# Patient Record
Sex: Female | Born: 1965
Health system: Southern US, Community
[De-identification: ages and names within clinical notes are randomized; demographics above are authoritative.]

## PROBLEM LIST (undated history)

## (undated) DIAGNOSIS — Z789 Other specified health status: Secondary | ICD-10-CM

## (undated) DIAGNOSIS — D219 Benign neoplasm of connective and other soft tissue, unspecified: Secondary | ICD-10-CM

## (undated) DIAGNOSIS — F329 Major depressive disorder, single episode, unspecified: Secondary | ICD-10-CM

## (undated) DIAGNOSIS — R51 Headache: Secondary | ICD-10-CM

## (undated) DIAGNOSIS — I8390 Asymptomatic varicose veins of unspecified lower extremity: Secondary | ICD-10-CM

## (undated) DIAGNOSIS — T7840XA Allergy, unspecified, initial encounter: Secondary | ICD-10-CM

## (undated) DIAGNOSIS — F32A Depression, unspecified: Secondary | ICD-10-CM

## (undated) HISTORY — DX: Major depressive disorder, single episode, unspecified: F32.9

## (undated) HISTORY — PX: SEPTOPLASTY: SUR1290

## (undated) HISTORY — PX: WISDOM TOOTH EXTRACTION: SHX21

## (undated) HISTORY — PX: VARICOSE VEIN SURGERY: SHX832

## (undated) HISTORY — DX: Asymptomatic varicose veins of unspecified lower extremity: I83.90

## (undated) HISTORY — DX: Benign neoplasm of connective and other soft tissue, unspecified: D21.9

## (undated) HISTORY — DX: Depression, unspecified: F32.A

## (undated) HISTORY — DX: Allergy, unspecified, initial encounter: T78.40XA

---

## 2004-08-13 ENCOUNTER — Inpatient Hospital Stay (HOSPITAL_COMMUNITY): Admission: AD | Admit: 2004-08-13 | Discharge: 2004-08-13 | Payer: Self-pay | Admitting: Obstetrics and Gynecology

## 2004-08-15 ENCOUNTER — Inpatient Hospital Stay (HOSPITAL_COMMUNITY): Admission: AD | Admit: 2004-08-15 | Discharge: 2004-08-15 | Payer: Self-pay | Admitting: Obstetrics and Gynecology

## 2004-08-22 ENCOUNTER — Inpatient Hospital Stay (HOSPITAL_COMMUNITY): Admission: AD | Admit: 2004-08-22 | Discharge: 2004-08-22 | Payer: Self-pay | Admitting: *Deleted

## 2005-01-22 ENCOUNTER — Inpatient Hospital Stay (HOSPITAL_COMMUNITY): Admission: AD | Admit: 2005-01-22 | Discharge: 2005-01-22 | Payer: Self-pay | Admitting: Obstetrics & Gynecology

## 2009-02-14 ENCOUNTER — Ambulatory Visit: Payer: Self-pay | Admitting: Internal Medicine

## 2009-02-14 DIAGNOSIS — D259 Leiomyoma of uterus, unspecified: Secondary | ICD-10-CM | POA: Insufficient documentation

## 2009-02-14 DIAGNOSIS — R5383 Other fatigue: Secondary | ICD-10-CM

## 2009-02-14 DIAGNOSIS — M545 Low back pain, unspecified: Secondary | ICD-10-CM | POA: Insufficient documentation

## 2009-02-14 DIAGNOSIS — E669 Obesity, unspecified: Secondary | ICD-10-CM | POA: Insufficient documentation

## 2009-02-14 DIAGNOSIS — R5381 Other malaise: Secondary | ICD-10-CM | POA: Insufficient documentation

## 2009-02-14 DIAGNOSIS — B351 Tinea unguium: Secondary | ICD-10-CM | POA: Insufficient documentation

## 2009-02-14 DIAGNOSIS — F909 Attention-deficit hyperactivity disorder, unspecified type: Secondary | ICD-10-CM | POA: Insufficient documentation

## 2009-02-14 DIAGNOSIS — L821 Other seborrheic keratosis: Secondary | ICD-10-CM | POA: Insufficient documentation

## 2009-02-22 DIAGNOSIS — R7309 Other abnormal glucose: Secondary | ICD-10-CM | POA: Insufficient documentation

## 2009-02-22 LAB — CONVERTED CEMR LAB
ALT: 35 units/L (ref 0–35)
AST: 20 units/L (ref 0–37)
Albumin: 4.2 g/dL (ref 3.5–5.2)
Alkaline Phosphatase: 80 units/L (ref 39–117)
BUN: 21 mg/dL (ref 6–23)
Basophils Absolute: 0 10*3/uL (ref 0.0–0.1)
Basophils Relative: 0 % (ref 0–1)
CO2: 24 meq/L (ref 19–32)
Calcium: 9 mg/dL (ref 8.4–10.5)
Chloride: 106 meq/L (ref 96–112)
Cholesterol: 154 mg/dL (ref 0–200)
Creatinine, Ser: 0.63 mg/dL (ref 0.40–1.20)
Eosinophils Absolute: 0.1 10*3/uL (ref 0.0–0.7)
Eosinophils Relative: 1 % (ref 0–5)
Glucose, Bld: 107 mg/dL — ABNORMAL HIGH (ref 70–99)
HCT: 43.7 % (ref 36.0–46.0)
HDL: 62 mg/dL (ref >39)
Hemoglobin: 13.6 g/dL (ref 12.0–15.0)
LDL Cholesterol: 78 mg/dL (ref 0–99)
Lymphocytes Relative: 24 % (ref 12–46)
Lymphs Abs: 2.3 10*3/uL (ref 0.7–4.0)
MCHC: 31.1 g/dL (ref 30.0–36.0)
MCV: 99.8 fL (ref 78.0–100.0)
Monocytes Absolute: 0.6 10*3/uL (ref 0.1–1.0)
Monocytes Relative: 6 % (ref 3–12)
Neutro Abs: 6.5 10*3/uL (ref 1.7–7.7)
Neutrophils Relative %: 69 % (ref 43–77)
Platelets: 255 10*3/uL (ref 150–400)
Potassium: 4.8 meq/L (ref 3.5–5.3)
RBC: 4.38 M/uL (ref 3.87–5.11)
RDW: 14.2 % (ref 11.5–15.5)
Sodium: 141 meq/L (ref 135–145)
TSH: 1.452 microintl units/mL (ref 0.350–4.500)
Total Bilirubin: 0.4 mg/dL (ref 0.3–1.2)
Total CHOL/HDL Ratio: 2.5
Total Protein: 7 g/dL (ref 6.0–8.3)
Triglycerides: 69 mg/dL (ref <150)
VLDL: 14 mg/dL (ref 0–40)
WBC: 9.5 10*3/uL (ref 4.0–10.5)

## 2009-03-06 ENCOUNTER — Encounter: Admission: RE | Admit: 2009-03-06 | Discharge: 2009-03-06 | Payer: Self-pay | Admitting: Internal Medicine

## 2009-03-06 ENCOUNTER — Encounter (INDEPENDENT_AMBULATORY_CARE_PROVIDER_SITE_OTHER): Payer: Self-pay | Admitting: Internal Medicine

## 2009-03-11 ENCOUNTER — Encounter (INDEPENDENT_AMBULATORY_CARE_PROVIDER_SITE_OTHER): Payer: Self-pay | Admitting: Internal Medicine

## 2009-03-14 ENCOUNTER — Ambulatory Visit (HOSPITAL_COMMUNITY): Admission: RE | Admit: 2009-03-14 | Discharge: 2009-03-14 | Payer: Self-pay | Admitting: Internal Medicine

## 2009-03-15 ENCOUNTER — Encounter: Admission: RE | Admit: 2009-03-15 | Discharge: 2009-05-28 | Payer: Self-pay | Admitting: Internal Medicine

## 2009-03-20 ENCOUNTER — Encounter (INDEPENDENT_AMBULATORY_CARE_PROVIDER_SITE_OTHER): Payer: Self-pay | Admitting: Internal Medicine

## 2009-03-28 ENCOUNTER — Ambulatory Visit: Payer: Self-pay | Admitting: Internal Medicine

## 2009-03-28 LAB — CONVERTED CEMR LAB
Blood Glucose, AC Bkfst: 118 mg/dL
Hgb A1c MFr Bld: 5.3 %

## 2009-04-18 ENCOUNTER — Ambulatory Visit: Payer: Self-pay | Admitting: Internal Medicine

## 2009-04-18 DIAGNOSIS — R109 Unspecified abdominal pain: Secondary | ICD-10-CM | POA: Insufficient documentation

## 2009-04-22 ENCOUNTER — Ambulatory Visit (HOSPITAL_COMMUNITY): Admission: RE | Admit: 2009-04-22 | Discharge: 2009-04-22 | Payer: Self-pay | Admitting: Internal Medicine

## 2009-04-25 ENCOUNTER — Ambulatory Visit: Payer: Self-pay | Admitting: *Deleted

## 2009-05-12 ENCOUNTER — Encounter (INDEPENDENT_AMBULATORY_CARE_PROVIDER_SITE_OTHER): Payer: Self-pay | Admitting: Internal Medicine

## 2009-06-05 ENCOUNTER — Encounter (INDEPENDENT_AMBULATORY_CARE_PROVIDER_SITE_OTHER): Payer: Self-pay | Admitting: Internal Medicine

## 2009-06-14 ENCOUNTER — Ambulatory Visit: Payer: Self-pay | Admitting: Internal Medicine

## 2009-06-14 ENCOUNTER — Encounter (INDEPENDENT_AMBULATORY_CARE_PROVIDER_SITE_OTHER): Payer: Self-pay | Admitting: Internal Medicine

## 2009-06-14 DIAGNOSIS — Q828 Other specified congenital malformations of skin: Secondary | ICD-10-CM | POA: Insufficient documentation

## 2009-06-14 LAB — CONVERTED CEMR LAB
BUN: 13 mg/dL (ref 6–23)
Bilirubin Urine: NEGATIVE
Blood in Urine, dipstick: NEGATIVE
CO2: 20 meq/L (ref 19–32)
Calcium: 9 mg/dL (ref 8.4–10.5)
Chlamydia, DNA Probe: NEGATIVE
Chloride: 104 meq/L (ref 96–112)
Creatinine, Ser: 0.67 mg/dL (ref 0.40–1.20)
GC Probe Amp, Genital: NEGATIVE
Glucose, Bld: 87 mg/dL (ref 70–99)
Glucose, Urine, Semiquant: NEGATIVE
KOH Prep: NEGATIVE
Ketones, urine, test strip: NEGATIVE
Nitrite: NEGATIVE
Potassium: 4.2 meq/L (ref 3.5–5.3)
Protein, U semiquant: NEGATIVE
Rapid HIV Screen: NEGATIVE
Sodium: 139 meq/L (ref 135–145)
Specific Gravity, Urine: 1.01
Urobilinogen, UA: 0.2
WBC Urine, dipstick: NEGATIVE
Whiff Test: NEGATIVE
pH: 6.5

## 2009-06-18 ENCOUNTER — Ambulatory Visit (HOSPITAL_COMMUNITY): Admission: RE | Admit: 2009-06-18 | Discharge: 2009-06-18 | Payer: Self-pay | Admitting: Internal Medicine

## 2009-06-18 ENCOUNTER — Encounter (INDEPENDENT_AMBULATORY_CARE_PROVIDER_SITE_OTHER): Payer: Self-pay | Admitting: Internal Medicine

## 2009-06-24 ENCOUNTER — Encounter (INDEPENDENT_AMBULATORY_CARE_PROVIDER_SITE_OTHER): Payer: Self-pay | Admitting: Internal Medicine

## 2009-07-29 ENCOUNTER — Telehealth (INDEPENDENT_AMBULATORY_CARE_PROVIDER_SITE_OTHER): Payer: Self-pay | Admitting: Internal Medicine

## 2009-08-06 ENCOUNTER — Ambulatory Visit: Payer: Self-pay | Admitting: Internal Medicine

## 2009-08-06 DIAGNOSIS — R51 Headache: Secondary | ICD-10-CM | POA: Insufficient documentation

## 2009-08-06 DIAGNOSIS — R519 Headache, unspecified: Secondary | ICD-10-CM | POA: Insufficient documentation

## 2010-03-04 ENCOUNTER — Telehealth (INDEPENDENT_AMBULATORY_CARE_PROVIDER_SITE_OTHER): Payer: Self-pay | Admitting: Internal Medicine

## 2010-06-04 ENCOUNTER — Telehealth (INDEPENDENT_AMBULATORY_CARE_PROVIDER_SITE_OTHER): Payer: Self-pay | Admitting: Internal Medicine

## 2010-06-18 ENCOUNTER — Telehealth (INDEPENDENT_AMBULATORY_CARE_PROVIDER_SITE_OTHER): Payer: Self-pay | Admitting: Internal Medicine

## 2010-07-18 ENCOUNTER — Telehealth (INDEPENDENT_AMBULATORY_CARE_PROVIDER_SITE_OTHER): Payer: Self-pay | Admitting: Internal Medicine

## 2010-08-28 ENCOUNTER — Telehealth (INDEPENDENT_AMBULATORY_CARE_PROVIDER_SITE_OTHER): Payer: Self-pay | Admitting: Internal Medicine

## 2010-08-29 ENCOUNTER — Ambulatory Visit: Payer: Self-pay | Admitting: Internal Medicine

## 2010-10-02 ENCOUNTER — Ambulatory Visit: Admit: 2010-10-02 | Payer: Self-pay | Admitting: Internal Medicine

## 2010-10-12 ENCOUNTER — Encounter: Payer: Self-pay | Admitting: Internal Medicine

## 2010-10-21 NOTE — Progress Notes (Signed)
Summary: NEED TO TALK TO THE NURSE  Phone Note Call from Patient Call back at [phone]   Summary of Call: START SATURDAY IS GOING TO BE TWO WEEKS SYMPTOMS IS FEVER, CONGESTION ,SINUS,SORE THROAT. PLEASE CALL PATIENT WE DON'T HAVE APPOINTMENT AVALIABLE WITH [facility] TILL 12.02.11 Initial call taken by: Domenic Polite,  July 18, 2010 11:07 AM  Follow-up for Phone Call        Starting to feel better, went to [facility] and got Amoxicillin and instructions for management of symptoms.  BP 150/88- advised that any stressors to the body can elevate BP.  Will call back if symptoms persist or worsen. Follow-up by: Dutch Quint RN,  July 18, 2010 5:35 PM

## 2010-10-21 NOTE — Miscellaneous (Signed)
Summary: Rehab Report/DISCHARGE SUMMARY  Rehab Report/DISCHARGE SUMMARY   Imported By: Arta Bruce 10/02/2009 15:36:38  _____________________________________________________________________  External Attachment:    Type:   Image     Comment:   External Document

## 2010-10-21 NOTE — Progress Notes (Signed)
Summary: Query:  Refill orthomicronor?  Phone Note Refill Request   Refills Requested: Medication #1:  ORTHO MICRONOR 0.35 MG TABS 1 tab by mouth daily. PLEASE CALL IT IN TO [facility] ON [address]   Initial call taken by: Oscar La,  June 04, 2010 3:22 PM  Follow-up for Phone Call        Do you want to refill her orthomicronor?  She was last seen 07/2009.  Has appt. for CPP 06/19/10.  Dutch Quint RN  June 04, 2010 3:39 PM   Additional Follow-up for Phone Call Additional follow up Details #1::        Fill x 2 mos.  Renew for 1 year at CPP. Tereso Newcomer PA-C  June 04, 2010 5:32 PM  Noted.  Rx sent to [facility] per pt. request.  Left message on answering machine for pt. to return call.  Dutch Quint RN  June 05, 2010 11:55 AM  Pt. notified of Rx refill -- confirmed CPP appt. for 06/19/10.  Dutch Quint RN  June 05, 2010 12:57 PM     Prescriptions: ORTHO MICRONOR 0.35 MG TABS (NORETHINDRONE (CONTRACEPTIVE)) 1 tab by mouth daily  #1 pack x 2   Entered by:   Dutch Quint RN   Authorized by:   Tereso Newcomer PA-C   Signed by:   Dutch Quint RN on 06/05/2010   Method used:   Electronically to        [facility]. [phone]* (retail)       [address]       [place], [place]  [address]       Ph: [phone]       Fax: [phone]   RxID:   [id number]

## 2010-10-21 NOTE — Progress Notes (Signed)
Summary: TALK TO DR   Phone Note Call from Patient   Caller: Patient Reason for Call: Talk to Doctor Summary of Call: PT IS TAKING PROGESTERONE FOR FYBROID AND PT IS BEING HAVING HER PERIODS EVERY OTHER WEEK AND SHE WANTS TO LET KNOW DR Jacqulyne Gladue IF IS NORMAL . PLEASE, CALL HER BACK [phone] THANKS . Initial call taken by: Cheryll Dessert,  March 04, 2010 11:40 AM  Follow-up for Phone Call        Sent to Dr. Delrae Alfred.  Dutch Quint RN  March 04, 2010 5:06 PM   Additional Follow-up for Phone Call Additional follow up Details #1::        Need more info--this is why she was placed on Micronor.  Did the bleeding get better at one point?-- as we have not heard from her in a while. If it improved and started back up again, how long has it been going on. How severe is the bleeding? Additional Follow-up by: Julieanne Manson MD,  March 04, 2010 6:20 PM    Additional Follow-up for Phone Call Additional follow up Details #2::    Left message on answering machine to return call.  Dutch Quint RN  March 05, 2010 9:27 AM  Spoke with pt. - she states that her bleeding has greatly improved, but, between her last on-time periods, she had an episode of a break-through period lasting five days, using 1-2 pads per day, which came seven days after her regular period had ended, which had been seven days, using 2 pads.   Her current period she is using one pad per day.  She was concerned by the break-through bleeding, wondering if it was normal.  I advised her that it is common for women on oral contraceptives to experience occasional break-through bleeding, but that I would inform her provider.  Dutch Quint RN  March 06, 2010 10:21 AM  Follow-up by: Dutch Quint RN,  March 06, 2010 10:21 AM  Additional Follow-up for Phone Call Additional follow up Details #3:: Details for Additional Follow-up Action Taken: Agree--have her call if continues to have problems  Advised and pt. agrees. Dutch Quint RN  March 07, 2010 12:35 PM  Additional Follow-up by: Julieanne Manson MD,  March 06, 2010 6:16 PM

## 2010-10-21 NOTE — Progress Notes (Signed)
Summary: REFILLS  Phone Note Refill Request   Refills Requested: Medication #1:  ORTHO MICRONOR 0.35 MG TABS 1 tab by mouth daily. PLEASE SEND RX TO [facility] FOR 2 MONTHS NEX APP IS 08-29-10.  Initial call taken by: Oscar La,  June 18, 2010 3:58 PM  Follow-up for Phone Call        That was already done on the 15th of this month--please call pharmacy and see if they received and notify pt. If pharmacy did not receive--give to them verbally please Follow-up by: Julieanne Manson MD,  June 20, 2010 12:13 AM  Additional Follow-up for Phone Call Additional follow up Details #1::        original rx was [facility] into [facility]. rx called into [facility] pt aware via detailed message  Additional Follow-up by: Michelle Nasuti,  June 20, 2010 3:13 PM

## 2010-10-23 NOTE — Progress Notes (Addendum)
Summary: Query BCP refill  Phone Note Refill Request   Refills Requested: Medication #1:  ORTHO MICRONOR 0.35 MG TABS 1 tab by mouth daily. [facility]  Initial call taken by: Armenia Shannon,  August 28, 2010 2:23 PM  Follow-up for Phone Call        Pt. has appt. scheduled for 08/29/10 with Dr. Delrae Alfred. She did not get her CPP done in Sept. Will have Dr. Delrae Alfred review. Follow-up by: Gaylyn Cheers RN,  August 28, 2010 4:58 PM  Additional Follow-up for Phone Call Additional follow up Details #1::        Will fill if she shows today Additional Follow-up by: Julieanne Manson MD,  August 29, 2010 8:17 AM     Appended Document: Query BCP refill Burna Mortimer from pharmacy called because the pt is calling them stating that she is suppose to have a refill for her [facility] but as the last note states pt was suppose to come in on Dec. 9 but she did not come... pt is scheduled for Jan. 12... she is asking for BCP.... what would you like for me to do/tell pt.... Armenia Shannon  September 24, 2010 4:20 PM   called pt to reschedule appt for Jan. 12.... and she would like to know is Aleea Hendry going to refill her BCP... pt next appt is march 8 for cpp.Marland KitchenMarland KitchenArmenia Shannon  September 25, 2010 3:25 PM  Did she ever come in?  Did we cancel the appt. or did she?  Julieanne Manson MD  October 22, 2010 7:19am  Left message on answering machine for pt to call back.Marland KitchenMarland KitchenMarland KitchenArmenia Shannon  October 23, 2010 10:25 AM pt did cancel appt because she had a job interview... pt says she feels better and she doesn't want a refill on BCP... pt says she going to try without it a little while..Armenia Shannon  October 23, 2010 10:43 AM

## 2011-04-28 ENCOUNTER — Other Ambulatory Visit: Payer: Self-pay | Admitting: Obstetrics and Gynecology

## 2011-04-28 DIAGNOSIS — Z1231 Encounter for screening mammogram for malignant neoplasm of breast: Secondary | ICD-10-CM

## 2011-05-11 ENCOUNTER — Ambulatory Visit
Admission: RE | Admit: 2011-05-11 | Discharge: 2011-05-11 | Disposition: A | Discharge status: Home / Self Care | Payer: Commercial Managed Care - PPO | Source: Ambulatory Visit | Attending: Obstetrics and Gynecology | Admitting: Obstetrics and Gynecology

## 2011-05-11 DIAGNOSIS — Z1231 Encounter for screening mammogram for malignant neoplasm of breast: Secondary | ICD-10-CM

## 2012-01-21 ENCOUNTER — Encounter: Payer: Self-pay | Admitting: Obstetrics and Gynecology

## 2012-01-21 ENCOUNTER — Ambulatory Visit (INDEPENDENT_AMBULATORY_CARE_PROVIDER_SITE_OTHER): Payer: Commercial Managed Care - PPO | Admitting: Obstetrics and Gynecology

## 2012-01-21 VITALS — BP 130/78 | Wt 251.0 lb

## 2012-01-21 DIAGNOSIS — N926 Irregular menstruation, unspecified: Secondary | ICD-10-CM

## 2012-01-21 DIAGNOSIS — N939 Abnormal uterine and vaginal bleeding, unspecified: Secondary | ICD-10-CM

## 2012-01-21 DIAGNOSIS — N938 Other specified abnormal uterine and vaginal bleeding: Secondary | ICD-10-CM

## 2012-01-21 MED ORDER — ESTRADIOL 2 MG PO TABS: 1.0000 mg | ORAL_TABLET | Freq: Every day | ORAL | Status: DC

## 2012-01-21 MED ORDER — PROGESTERONE MICRONIZED 100 MG PO CAPS: ORAL_CAPSULE | ORAL | Status: DC

## 2012-01-21 NOTE — Progress Notes (Addendum)
  Current contraception: none. Hormone replacement therapy:  Estrace 2 mg daily and Prometrium 100 mg daily since 07/2011   New medication: No  History of STD: HSV 2  History of infertility: no. History of abnormal Pap smear: no History of fibroids: Yes   Increased stress: No  Abnormal bleeding pattern started: 07/2011   46 yo diagnosed with perimenopause 04/2011. Was doing very well with cyclic HRT. Now having daily bleeding since January 2013, mainly light except when menstrual flow, feels crampy like going to have a period. Also recently diagnosed with vascular migraines that are now improving. Still feeling so much better emotionally.  A/P: DUB 2nd to HRT        Change HRT to Estrace 1 mg daily and prometrium 200 mg d1-10 of each month

## 2012-01-29 ENCOUNTER — Telehealth: Payer: Self-pay | Admitting: Obstetrics and Gynecology

## 2012-02-01 NOTE — Telephone Encounter (Signed)
Laura/SR pt/recent appt w/existing prob.

## 2012-02-01 NOTE — Telephone Encounter (Signed)
Meds were changed at last appt on 01-21-12.  C/o confusion and depression and just feeling awful.  Went back to the way she was taking...Marland KitchenMarland Kitchen prometrium 200mg   and estrogen 2mg  .  Feels better now. Do you want her to stop the prometrium after 10 days or how do you want her to take it now.  Doesn't want to change the estrogen....the patient thinks that is what caused the problems.

## 2012-02-04 NOTE — Telephone Encounter (Signed)
Estrace 2 mg daily and Prometrium 200 mg for 10 days each month

## 2012-02-05 NOTE — Telephone Encounter (Signed)
New msg from pt stating she is taking old dosage and seems to be doing really well with that.  ld

## 2012-02-08 ENCOUNTER — Ambulatory Visit (INDEPENDENT_AMBULATORY_CARE_PROVIDER_SITE_OTHER): Payer: Commercial Managed Care - PPO | Admitting: Family Medicine

## 2012-02-08 VITALS — BP 125/74 | HR 80 | Temp 97.9°F | Resp 16 | Ht 68.0 in | Wt 246.6 lb

## 2012-02-08 DIAGNOSIS — M545 Low back pain, unspecified: Secondary | ICD-10-CM

## 2012-02-08 DIAGNOSIS — M549 Dorsalgia, unspecified: Secondary | ICD-10-CM

## 2012-02-08 MED ORDER — CYCLOBENZAPRINE HCL 10 MG PO TABS: 10.0000 mg | ORAL_TABLET | Freq: Three times a day (TID) | ORAL | Status: DC | PRN

## 2012-02-08 MED ORDER — TRAMADOL HCL 50 MG PO TABS: 50.0000 mg | ORAL_TABLET | Freq: Three times a day (TID) | ORAL | Status: DC | PRN

## 2012-02-08 NOTE — Patient Instructions (Signed)

## 2012-02-08 NOTE — Progress Notes (Signed)
This is a 46 year old unemployed woman who fell in her bathroom one week ago. The fall resulted in a twisting action in her making contact with her side. She did not actually hit the floor. Since that time she's had significant spasm in her back with leg pain radiating down the right leg.  Patient has had a past history of low back pain for which she is consulted chiropractics and received relief.  She denies any urinary symptoms, bowel problems, numbness in the legs, loss of motor power. She's had no fever.  Objective: Obese middle-aged woman in no acute distress.  Palpation of the back reveals no localized tenderness.  Inspection of the back: Reveals no scoliosis  Straight-leg raising: Pain reproduced in the right back with left leg straightening well seated. No pain with right straight leg raising.  Motor exam of lower extremity: No abnormal weakness.  Reflexes: Symmetric and normal  Skin exam: Fading ecchymosis left brachial radialis area.  Assessment: Acute lower back spasms without neurological findings.  Plan: Patient given Flexeril and tramadol prescriptions. Told to followup if pain not better in 48 hours. Given names of 2 different chiropractors in town.

## 2012-02-11 ENCOUNTER — Ambulatory Visit (INDEPENDENT_AMBULATORY_CARE_PROVIDER_SITE_OTHER): Payer: Commercial Managed Care - PPO | Admitting: Physician Assistant

## 2012-02-11 VITALS — BP 108/60 | HR 96 | Temp 98.5°F | Resp 16 | Wt 249.0 lb

## 2012-02-11 DIAGNOSIS — Z131 Encounter for screening for diabetes mellitus: Secondary | ICD-10-CM

## 2012-02-11 DIAGNOSIS — M62838 Other muscle spasm: Secondary | ICD-10-CM

## 2012-02-11 DIAGNOSIS — M549 Dorsalgia, unspecified: Secondary | ICD-10-CM

## 2012-02-11 LAB — GLUCOSE, POCT (MANUAL RESULT ENTRY): POC Glucose: 96 mg/dl (ref 70–99)

## 2012-02-11 MED ORDER — CYCLOBENZAPRINE HCL 10 MG PO TABS: 10.0000 mg | ORAL_TABLET | Freq: Three times a day (TID) | ORAL | Status: AC | PRN

## 2012-02-11 MED ORDER — NAPROXEN 500 MG PO TABS: ORAL_TABLET | ORAL | Status: DC

## 2012-02-11 MED ORDER — TRAMADOL HCL 50 MG PO TABS: 50.0000 mg | ORAL_TABLET | Freq: Three times a day (TID) | ORAL | Status: DC | PRN

## 2012-02-11 MED ORDER — METHYLPREDNISOLONE ACETATE 80 MG/ML IJ SUSP
120.0000 mg | Freq: Once | INTRAMUSCULAR | Status: AC
  Administered 2012-02-11: 120 mg via INTRAMUSCULAR

## 2012-02-11 NOTE — Progress Notes (Signed)
  Subjective:    Patient ID: Crystal Williamson, female    DOB: [birth year], 46 y.o.   MRN: [id number]  HPI  See last note.  She is continuing to have low back pain.  Pain is no better. Also, she is going out of town and needs a refill on her flexeril and ultram or she will run out while she is away.  No paresthesias.  Review of Systems  All other systems reviewed and are negative.       Objective:   Physical Exam  Nursing note and vitals reviewed. Constitutional: She is oriented to person, place, and time. She appears well-developed and well-nourished.       Difficulty moving around and appears to be in pain.  Cardiovascular: Normal rate, regular rhythm and normal heart sounds.   Musculoskeletal: She exhibits tenderness (TTP along paraspinus muscles and spasms that are tender.). She exhibits no edema.       +sitting L leg raise.  Negative R leg raise.  Neurological: She is alert and oriented to person, place, and time. She has normal reflexes. No cranial nerve deficit.  Skin: Skin is warm and dry.     Results for orders placed in visit on 02/11/12  GLUCOSE, POCT (MANUAL RESULT ENTRY)      Component Value Range   POC Glucose 96  70 - 99 (mg/dl)        Assessment & Plan:  Lower back strain-no neurological findings-continue other meds as needed.  (additional flexeril and ultram sent so she won't run out while out of town).  120 depo medrol IM now. Start naprosyn.

## 2012-05-09 ENCOUNTER — Ambulatory Visit (INDEPENDENT_AMBULATORY_CARE_PROVIDER_SITE_OTHER): Payer: Commercial Managed Care - PPO | Admitting: Family Medicine

## 2012-05-09 VITALS — BP 128/78 | HR 84 | Temp 98.4°F | Resp 16 | Ht 67.5 in | Wt 240.0 lb

## 2012-05-09 DIAGNOSIS — M549 Dorsalgia, unspecified: Secondary | ICD-10-CM

## 2012-05-09 DIAGNOSIS — R079 Chest pain, unspecified: Secondary | ICD-10-CM

## 2012-05-09 DIAGNOSIS — R300 Dysuria: Secondary | ICD-10-CM

## 2012-05-09 LAB — POCT CBC
Granulocyte percent: 72.1 %G (ref 37–80)
HCT, POC: 43.6 % (ref 37.7–47.9)
Hemoglobin: 13.6 g/dL (ref 12.2–16.2)
Lymph, poc: 2.6 (ref 0.6–3.4)
MCV: 100.1 fL — AB (ref 80–97)
MID (cbc): 0.6 (ref 0–0.9)
MPV: 10.6 fL (ref 0–99.8)
POC Granulocyte: 8.2 — AB (ref 2–6.9)
POC LYMPH PERCENT: 22.7 %L (ref 10–50)
POC MID %: 5.2 %M (ref 0–12)
Platelet Count, POC: 242 10*3/uL (ref 142–424)
RBC: 4.36 M/uL (ref 4.04–5.48)
RDW, POC: 14 %
WBC: 11.4 10*3/uL — AB (ref 4.6–10.2)

## 2012-05-09 LAB — POCT URINALYSIS DIPSTICK
Bilirubin, UA: NEGATIVE
Ketones, UA: NEGATIVE
Nitrite, UA: NEGATIVE
Protein, UA: NEGATIVE
pH, UA: 5.5

## 2012-05-09 LAB — POCT UA - MICROSCOPIC ONLY
Casts, Ur, LPF, POC: NEGATIVE
Crystals, Ur, HPF, POC: NEGATIVE
Mucus, UA: NEGATIVE
Yeast, UA: NEGATIVE

## 2012-05-09 LAB — POCT URINE PREGNANCY: Preg Test, Ur: NEGATIVE

## 2012-05-09 MED ORDER — CIPROFLOXACIN HCL 500 MG PO TABS: 500.0000 mg | ORAL_TABLET | Freq: Two times a day (BID) | ORAL | Status: AC

## 2012-05-09 MED ORDER — CYCLOBENZAPRINE HCL 10 MG PO TABS: 10.0000 mg | ORAL_TABLET | Freq: Three times a day (TID) | ORAL | Status: DC | PRN

## 2012-05-09 NOTE — Progress Notes (Addendum)
[facility] [address] [place] [address] [phone]  Date:  05/09/2012   Name:  Crystal Williamson   DOB:  1965-11-22   MRN:  [id number]  PCP:  Crystal Manson, MD    Chief Complaint: Urinary Tract Infection and Medication Refill   History of Present Illness:  Crystal Williamson is a 46 y.o. very pleasant female patient who presents with the following:  She is concerned about a UTI.  She noted left lower back pain yesterday, and bilateral LBP this morning.  Her husband tried to rub her back and noted tenderness- he was concerned that she was tender over her kidneys. She also noted dysuria and frequency for several days.  She has not noted hematuria, but she does tend to have irregular vaginal bleeding. Light bleeding currently- she tends to bleed almost constantly. She has noted this problem for nearly a year; her OBG is treating her with prometrium.  She has a history of uterine fibroids and can have abdominal tenderness/ pain which she has noted for a couple of months at least.    She does take flexeril sometimes for her back pain.  She prefers to visit her chiropractor for her back pain, but she does not have any visits left for this year under her insurance.  She would like to have some flexeril to have on hand.   She has felt a little bit feverish, but has not had an actual fever.  She vomitedonce last night- nausea is now resolved.  She was able to eat this morning and is about to eat lunch now- her mother brought Crystal Williamson food for her to clinic.    At the end of the visit she mentioned that she had noted some CP recently.  She had noted pains for about one month- but has had in the past as well.  The pains come and go, and might last for 15 minutes or so.  The pains were non- exertional.  Were not like heartburn that she has had in the past. She has not had any pain in the last 2 weeks.    She does not have a history of heart trouble.  Family history  of "heart murmurs" but nothing else. Never told that she had high cholesterol.  No trouble with HTN that she is aware of.   She is a smoker  Patient Active Problem List  Diagnosis  . DERMATOPHYTOSIS OF NAIL  . FIBROIDS, UTERUS  . OBESITY  . SEBORRHEIC KERATOSIS  . LOW BACK PAIN, CHRONIC  . KERATOSIS PILARIS  . FATIGUE  . HEADACHE  . PELVIC PAIN, RIGHT  . HYPERGLYCEMIA  . ATTENTION DEFICIT DISORDER, HX OF    No past medical history on file.  No past surgical history on file.  History  Substance Use Topics  . Smoking status: Current Everyday Smoker  . Smokeless tobacco: Not on file  . Alcohol Use: Not on file    Family History  Problem Relation Age of Onset  . Lumbar disc disease Mother     Allergies  Allergen Reactions  . Codeine     REACTION: N \\T \ V    Medication list has been reviewed and updated.  Current Outpatient Prescriptions on File Prior to Visit  Medication Sig Dispense Refill  . estradiol (ESTRACE) 2 MG tablet Take 2 mg by mouth daily.      . progesterone (PROMETRIUM) 100 MG capsule Take 100 mg by mouth daily.      Marland Kitchen  terbinafine (LAMISIL) 250 MG tablet Take 250 mg by mouth daily.        Review of Systems:  As per HPI- otherwise negative.   Physical Examination: Filed Vitals:   05/09/12 1302  BP: 128/78  Pulse: 84  Temp: 98.4 F (36.9 C)  Resp: 16   Filed Vitals:   05/09/12 1302  Height: 5' 7.5" (1.715 m)  Weight: 240 lb (108.863 kg)   Body mass index is 37.03 kg/(m^2). Ideal Body Weight: Weight in (lb) to have BMI = 25: 161.7   GEN: WDWN, NAD, Non-toxic, A & O x 3, obese HEENT: Atraumatic, Normocephalic. Neck supple. No masses, No LAD.  Tm and oropharynx wnl, PEERL, EOMI Ears and Nose: No external deformity. CV: RRR, No M/G/R. No JVD. No thrill. No extra heart sounds. PULM: CTA B, no wheezes, crackles, rhonchi. No retractions. No resp. distress. No accessory muscle use. ABD: S,  ND, +BS. No rebound. No HSM.  She has minimal  suprapubic and RLQ tenderness which she attributes to her uterine fibroids.  Uterus is palpable and enlarged.  She also has bilateral CVA tenderness.  Left is more tender than right EXTR: No c/c/e NEURO Normal gait.  PSYCH: Normally interactive. Conversant. Not depressed or anxious appearing.  Calm demeanor.   Results for orders placed in visit on 05/09/12  POCT URINALYSIS DIPSTICK      Component Value Range   Color, UA yellow     Clarity, UA clear     Glucose, UA neg     Bilirubin, UA neg     Ketones, UA neg     Spec Grav, UA <=1.005     Blood, UA large     pH, UA 5.5     Protein, UA neg     Urobilinogen, UA 0.2     Nitrite, UA neg     Leukocytes, UA small (1+)    POCT UA - MICROSCOPIC ONLY      Component Value Range   WBC, Ur, HPF, POC 0-1     RBC, urine, microscopic 1-3     Bacteria, U Microscopic 1+     Mucus, UA neg     Epithelial cells, urine per micros 8-10     Crystals, Ur, HPF, POC neg     Casts, Ur, LPF, POC neg     Yeast, UA neg    POCT CBC      Component Value Range   WBC 11.4 (*) 4.6 - 10.2 K/uL   Lymph, poc 2.6  0.6 - 3.4   POC LYMPH PERCENT 22.7  10 - 50 %L   MID (cbc) 0.6  0 - 0.9   POC MID % 5.2  0 - 12 %M   POC Granulocyte 8.2 (*) 2 - 6.9   Granulocyte percent 72.1  37 - 80 %G   RBC 4.36  4.04 - 5.48 M/uL   Hemoglobin 13.6  12.2 - 16.2 g/dL   HCT, POC 16.1  09.6 - 47.9 %   MCV 100.1 (*) 80 - 97 fL   MCH, POC 31.2  27 - 31.2 pg   MCHC 31.2 (*) 31.8 - 35.4 g/dL   RDW, POC 04.5     Platelet Count, POC 242  142 - 424 K/uL   MPV 10.6  0 - 99.8 fL  POCT URINE PREGNANCY      Component Value Range   Preg Test, Ur Negative     EKG: NSR, no ST elevation or depression.  Assessment and Plan: 1. Dysuria  POCT urinalysis dipstick, POCT UA - Microscopic Only, POCT CBC, POCT urine pregnancy, Urine culture, ciprofloxacin (CIPRO) 500 MG tablet  2. Back pain  cyclobenzaprine (FLEXERIL) 10 MG tablet  3. Chest pain     Crystal Williamson is a nice lady who presents today  with back pain and urinary frequency/ dysuria.  Suspect that Crystal Williamson has a UTI or mild pyelo. Will start treatment with cipro while we await her urine culture.   Also gave her a supply of flexeril to have on hand for her chronic back pain.  She has used this for some time and had good results.  Karleigh also has complaint of CP- this has not occurred for a couple of weeks.  Her normal EKG is reassuring.  However, if her CP returns will need to have a stress test.  She will let us know if her CP returns.  She is a smoker and also needs to have a FLP performed at a later date.  She is not fasting for this test today.   Elica had not noted any current abdominal pain until I examined her; her history is not consistent with appendicitis. She states that she has had problems with right pelvic pain for some time- indeed, a pelvic ultrasound from 2010 states right pelvic pain as the reason for exam.  She states that she has had "every test under the sun" for this problem.  She has no fever and a minimal leukocytosis, so we prefer to avoid the radiation of a CT if possible. However, counseled her that if her symptoms are not better (with antibiotic treatment) by tomorrow we may need to consider a CT scan.  She will return to clinic tomorrow first thing to be seen if she is not feeling better.  She is also asked to call me tonight if she has any problems or worsening of her symptoms.    Abbe Amsterdam, MD

## 2012-05-10 ENCOUNTER — Other Ambulatory Visit: Payer: Self-pay | Admitting: Obstetrics and Gynecology

## 2012-05-10 ENCOUNTER — Telehealth: Payer: Self-pay

## 2012-05-10 ENCOUNTER — Telehealth: Payer: Self-pay | Admitting: Family Medicine

## 2012-05-10 DIAGNOSIS — Z1231 Encounter for screening mammogram for malignant neoplasm of breast: Secondary | ICD-10-CM

## 2012-05-10 NOTE — Telephone Encounter (Signed)
I called her earlier- see phone note

## 2012-05-10 NOTE — Telephone Encounter (Signed)
PT STATES DR COPLAND HAD CALLED HER TODAY AND WANTED TO CHECK UP ON HER. STATES SHE IS FEELING SOMEWHAT BETTER THAN YESTERDAY, SHE STILL HAVE 2 MORE DOSAGE OF MEDICINE AND DIDN'T KNOW WHEN SHOULD SHE TAKE IT. PLEASE CALL [phone]

## 2012-05-10 NOTE — Telephone Encounter (Signed)
Spoke with Crystal Williamson in detail today.  She does feel that she is better, still has some dysuria and occasional "twinge" in her back.  No nausea or vomiting.  She stated that she has had problems with RLQ pain for at least a year- this is not new and is better than usual today.  However, counseled her that to be most careful and conservative I would recommend that we recheck her today- I would like for her to have a repeat abdominal exam and CBC.  She does not think that she can make it to the clinic today.  Advised her that I do think a recheck today would be best- and she must be seen if she has any increasing pain, nausea, vomiting, or other symptoms.  She will call me if there is anything else we can do to help.

## 2012-05-11 ENCOUNTER — Ambulatory Visit (INDEPENDENT_AMBULATORY_CARE_PROVIDER_SITE_OTHER): Payer: Commercial Managed Care - PPO | Admitting: Family Medicine

## 2012-05-11 ENCOUNTER — Ambulatory Visit
Admission: RE | Admit: 2012-05-11 | Discharge: 2012-05-11 | Disposition: A | Discharge status: Home / Self Care | Payer: Commercial Managed Care - PPO | Source: Ambulatory Visit | Attending: Family Medicine | Admitting: Family Medicine

## 2012-05-11 VITALS — BP 120/74 | HR 95 | Temp 99.0°F | Resp 16 | Ht 67.5 in | Wt 242.2 lb

## 2012-05-11 DIAGNOSIS — N390 Urinary tract infection, site not specified: Secondary | ICD-10-CM

## 2012-05-11 DIAGNOSIS — R109 Unspecified abdominal pain: Secondary | ICD-10-CM

## 2012-05-11 DIAGNOSIS — Z124 Encounter for screening for malignant neoplasm of cervix: Secondary | ICD-10-CM

## 2012-05-11 DIAGNOSIS — D72829 Elevated white blood cell count, unspecified: Secondary | ICD-10-CM

## 2012-05-11 LAB — POCT URINALYSIS DIPSTICK
Bilirubin, UA: NEGATIVE
Glucose, UA: NEGATIVE
Ketones, UA: NEGATIVE
Leukocytes, UA: NEGATIVE
Nitrite, UA: NEGATIVE
Protein, UA: NEGATIVE
Urobilinogen, UA: 0.2
pH, UA: 6

## 2012-05-11 LAB — POCT CBC
HCT, POC: 42.8 % (ref 37.7–47.9)
Hemoglobin: 13.5 g/dL (ref 12.2–16.2)
Lymph, poc: 2.8 (ref 0.6–3.4)
MCHC: 31.5 g/dL — AB (ref 31.8–35.4)
MCV: 99.5 fL — AB (ref 80–97)
MID (cbc): 0.7 (ref 0–0.9)
MPV: 10.8 fL (ref 0–99.8)
POC Granulocyte: 8.3 — AB (ref 2–6.9)
POC LYMPH PERCENT: 23.6 %L (ref 10–50)
POC MID %: 5.7 %M (ref 0–12)
Platelet Count, POC: 247 10*3/uL (ref 142–424)
RBC: 4.3 M/uL (ref 4.04–5.48)
WBC: 11.7 10*3/uL — AB (ref 4.6–10.2)

## 2012-05-11 LAB — URINE CULTURE

## 2012-05-11 LAB — POCT UA - MICROSCOPIC ONLY
Casts, Ur, LPF, POC: NEGATIVE
Crystals, Ur, HPF, POC: NEGATIVE
Mucus, UA: NEGATIVE
RBC, urine, microscopic: NEGATIVE
Yeast, UA: NEGATIVE

## 2012-05-11 MED ORDER — IOHEXOL 300 MG/ML  SOLN
30.0000 mL | Freq: Once | INTRAMUSCULAR | Status: AC | PRN
  Administered 2012-05-11: 30 mL via ORAL

## 2012-05-11 MED ORDER — IOHEXOL 300 MG/ML  SOLN
125.0000 mL | Freq: Once | INTRAMUSCULAR | Status: AC | PRN
  Administered 2012-05-11: 125 mL via INTRAVENOUS

## 2012-05-11 NOTE — Progress Notes (Signed)
[facility] [address], [place] [address] [phone]  Date:  05/11/2012   Name:  Crystal Williamson   DOB:  [birth year]   MRN:  [id number]  PCP:  Abbe Amsterdam, MD    Chief Complaint: Follow-up   History of Present Illness:  Crystal Williamson is a 46 y.o. very pleasant female patient who presents with the following:  Here today for a recheck.  She was here on 8/19 with UTI symptoms and back pain, and also RLQ pain of long duration.  Her urine culture has indeed grown over 100K colonies of E. Coli, but sensitivities are still pending.  (of note by end of evening sen are in- pan sensitive, sensitive to cipro).  She has noted a mild ST which she thinks she caught from a family member.  She still does feel achy and tired, and feels feverish.  She continues to note her RLQ pain.  The history of this pain is somewhat unclear, but it seems this has been present for some time- a year or more.   She is able to eat but has not had a great appetite.    Menorrhagia issues treated by her OBG with hormones- spotting currently.  Would like to have a pap as long as we are doing a pelvic exam.  This is fine.   Patient Active Problem List  Diagnosis  . DERMATOPHYTOSIS OF NAIL  . FIBROIDS, UTERUS  . OBESITY  . SEBORRHEIC KERATOSIS  . LOW BACK PAIN, CHRONIC  . KERATOSIS PILARIS  . FATIGUE  . HEADACHE  . PELVIC PAIN, RIGHT  . HYPERGLYCEMIA  . ATTENTION DEFICIT DISORDER, HX OF    No past medical history on file.  No past surgical history on file.  History  Substance Use Topics  . Smoking status: Current Everyday Smoker  . Smokeless tobacco: Not on file  . Alcohol Use: 10.5 oz/week    Family History  Problem Relation Age of Onset  . Colon cancer Paternal Grandfather   . Diabetes Paternal Grandfather     type 2   . Breast cancer Paternal Grandmother   . Stroke Maternal Grandmother   . Lupus Mother   . Lumbar disc disease Mother     Allergies    Allergen Reactions  . Codeine     REACTION: N \\T \ V    Medication list has been reviewed and updated.  Current Outpatient Prescriptions on File Prior to Visit  Medication Sig Dispense Refill  . ciprofloxacin (CIPRO) 500 MG tablet Take 1 tablet (500 mg total) by mouth 2 (two) times daily.  14 tablet  0  . cyclobenzaprine (FLEXERIL) 10 MG tablet Take 1 tablet (10 mg total) by mouth 3 (three) times daily as needed.  30 tablet  0  . estradiol (ESTRACE) 2 MG tablet Take 2 mg by mouth daily.      . progesterone (PROMETRIUM) 100 MG capsule Take 100 mg by mouth daily.      Marland Kitchen terbinafine (LAMISIL) 250 MG tablet Take 250 mg by mouth daily.      Marland Kitchen estradiol (ESTRACE) 2 MG tablet Take 0.5 tablets (1 mg total) by mouth daily.  30 tablet  11  . progesterone (PROMETRIUM) 100 MG capsule Take 200 mg of prometrium day 1 to 10 of each calendar  30 capsule  4    Review of Systems:  As per HPI- otherwise negative.   Physical Examination: Filed Vitals:   05/11/12 1255  BP: 120/74  Pulse: 95  Temp: 99 F (37.2 C)  Resp: 16   Filed Vitals:   05/11/12 1255  Height: 5' 7.5" (1.715 m)  Weight: 242 lb 3.2 oz (109.861 kg)   Body mass index is 37.37 kg/(m^2). Ideal Body Weight: Weight in (lb) to have BMI = 25: 161.7   GEN: WDWN, NAD, Non-toxic, A & O x 3, obese HEENT: Atraumatic, Normocephalic. Neck supple. No masses, No LAD. Ears and Nose: No external deformity. CV: RRR, No M/G/R. No JVD. No thrill. No extra heart sounds. PULM: CTA B, no wheezes, crackles, rhonchi. No retractions. No resp. distress. No accessory muscle use. ABD: S, ND, +BS. No rebound. No HSM. She does have RLQ pain that is better and then worse with repeat examinations.  Negative heel tap, negative hip flexion GU: normal exam, cervix difficult to fully visualize due to body habitus.  No CMT, but she does have an enlarged uterus (likely fibroids) and some right adnexal tenderness.   EXTR: No c/c/e NEURO Normal gait.  PSYCH:  Normally interactive. Conversant. Not depressed or anxious appearing.  Calm demeanor.   Results for orders placed in visit on 05/11/12  POCT CBC      Component Value Range   WBC 11.7 (*) 4.6 - 10.2 K/uL   Lymph, poc 2.8  0.6 - 3.4   POC LYMPH PERCENT 23.6  10 - 50 %L   MID (cbc) 0.7  0 - 0.9   POC MID % 5.7  0 - 12 %M   POC Granulocyte 8.3 (*) 2 - 6.9   Granulocyte percent 70.7  37 - 80 %G   RBC 4.30  4.04 - 5.48 M/uL   Hemoglobin 13.5  12.2 - 16.2 g/dL   HCT, POC 40.9  81.1 - 47.9 %   MCV 99.5 (*) 80 - 97 fL   MCH, POC 31.4 (*) 27 - 31.2 pg   MCHC 31.5 (*) 31.8 - 35.4 g/dL   RDW, POC 91.4     Platelet Count, POC 247  142 - 424 K/uL   MPV 10.8  0 - 99.8 fL  POCT UA - MICROSCOPIC ONLY      Component Value Range   WBC, Ur, HPF, POC 1-2     RBC, urine, microscopic NEG     Bacteria, U Microscopic NEG     Mucus, UA NEG     Epithelial cells, urine per micros 1-2     Crystals, Ur, HPF, POC NEG     Casts, Ur, LPF, POC NEG     Yeast, UA NEG    POCT URINALYSIS DIPSTICK      Component Value Range   Color, UA YELLOW     Clarity, UA CLOUDY     Glucose, UA NEG     Bilirubin, UA NEG     Ketones, UA NEG     Spec Grav, UA 1.025     Blood, UA NEG     pH, UA 6.0     Protein, UA NEG     Urobilinogen, UA 0.2     Nitrite, UA NEG     Leukocytes, UA Negative    POCT URINE PREGNANCY      Component Value Range   Preg Test, Ur Negative      Janiene still has RLQ pain.  Although this has been present for a long time, she also has a stable leukocytosis.  A CT would be prudent to rule- out appendicitis at this point.  She is willing to proceed.  CT ABDOMEN AND PELVIS WITH CONTRAST  Technique: Multidetector CT imaging of the abdomen and pelvis was performed following the standard protocol during bolus administration of intravenous contrast.  Contrast: 30mL OMNIPAQUE IOHEXOL 300 MG/ML SOLN, OMNIPAQUE IOHEXOL 300 MG/ML SOLN  Comparison: None.  Findings: Lung bases show no acute  findings. Heart size normal. No pericardial or pleural effusion.  Liver, gallbladder and right adrenal gland are unremarkable. A nodule in the medial limb left adrenal gland measures 1.4 cm and 71 HU. Minimal right pelvocaliceal fullness with dilatation of the right ureter to the level of an enlarged uterus. No definite ureteral stone. Sub centimeter low attenuation lesion in the upper pole right kidney is too small to characterize. Kidneys, spleen, pancreas, stomach and bowel are otherwise unremarkable. Although the appendix is not readily visualized, there are no inflammatory changes in the right lower quadrant. No free fluid.  Uterus is enlarged, measuring approximately 13.5 x 7.2 x 11.7 cm. At least one probable fibroid is identified anteriorly, measuring 7.2 cm. Probable sub centimeter Nabothian cyst in the cervix. No pathologically enlarged lymph nodes. No worrisome lytic or sclerotic lesions. No worrisome lytic or sclerotic lesions.  IMPRESSION:  1. No evidence of acute appendicitis. 2. Mild right hydronephrosis appears to be secondary to an enlarged fibroid uterus. 3. Small left adrenal nodule cannot be definitively characterized without IV contrast. In the absence of known malignancy, an adenoma is favored.  Assessment and Plan: 1. Leukocytosis  POCT CBC, Comprehensive metabolic panel, POCT UA - Microscopic Only, POCT urinalysis dipstick, POCT urine pregnancy  2. Screening for cervical cancer  Pap IG w/ reflex to HPV when ASC-U  3. UTI (lower urinary tract infection)    4. Abdominal  pain, other specified site  CT Abdomen Pelvis W Contrast   Persistent RLQ pain.  CT results called to patient as above.  No appendicitis- she is pleased.  It is possible that her pain is caused by right renal congestion.  Will discuss this with her OBG as they may wish to treat her surgically.  Will contact her OB in the next day or so.  Await CMP and pap result.    Abbe Amsterdam, MD

## 2012-05-12 ENCOUNTER — Telehealth: Payer: Self-pay | Admitting: Family Medicine

## 2012-05-12 DIAGNOSIS — N920 Excessive and frequent menstruation with regular cycle: Secondary | ICD-10-CM

## 2012-05-12 DIAGNOSIS — D259 Leiomyoma of uterus, unspecified: Secondary | ICD-10-CM

## 2012-05-12 DIAGNOSIS — N888 Other specified noninflammatory disorders of cervix uteri: Secondary | ICD-10-CM

## 2012-05-12 LAB — COMPREHENSIVE METABOLIC PANEL
ALT: 34 U/L (ref 0–35)
AST: 19 U/L (ref 0–37)
Albumin: 4 g/dL (ref 3.5–5.2)
Alkaline Phosphatase: 66 U/L (ref 39–117)
BUN: 11 mg/dL (ref 6–23)
CO2: 25 mEq/L (ref 19–32)
Calcium: 9 mg/dL (ref 8.4–10.5)
Chloride: 106 mEq/L (ref 96–112)
Potassium: 4.3 mEq/L (ref 3.5–5.3)
Sodium: 140 mEq/L (ref 135–145)
Total Bilirubin: 0.3 mg/dL (ref 0.3–1.2)
Total Protein: 6.5 g/dL (ref 6.0–8.3)

## 2012-05-12 LAB — PAP IG W/ RFLX HPV ASCU

## 2012-05-12 NOTE — Telephone Encounter (Signed)
Called and talked with her- her OB office did recommend that she follow- up with them as soon as she can.  Renal and liver function ok.  If any change in her symptoms please let me know, and follow- up with OB- call them tomorrow

## 2012-05-12 NOTE — Telephone Encounter (Signed)
Spoke with patient she want to know her 2nd urine results from yesterday and her obgyn is Dr Darl Pikes

## 2012-05-12 NOTE — Telephone Encounter (Signed)
Dr Patsy Lager wanted [place] to call pt stating liver function test were fine, and would like to know who her OB is.   lmom explaing liver function tests were fine.  asked to cb with OB information .  awaiting return call.

## 2012-05-12 NOTE — Telephone Encounter (Signed)
Per dr copland last urine look good, meds are working.  Also, her OB is Dd. [facility] [place] [facility].

## 2012-05-13 ENCOUNTER — Encounter: Payer: Self-pay | Admitting: Family Medicine

## 2012-05-13 ENCOUNTER — Telehealth: Payer: Self-pay | Admitting: Obstetrics and Gynecology

## 2012-05-18 NOTE — Telephone Encounter (Signed)
Why don't we try [facility] or [facility]?

## 2012-05-18 NOTE — Telephone Encounter (Signed)
Pt had CT scan after going to PCP for UTI.  CT shows that uterus is so large that it is putting pressure on R kidney.  Pt has history of fibroids and has discussed with SR.  PCP states that kidney function is ok at this time but is partially obstructed and will need to be seen ASAP and possible surgery.  To SR for appointment options.  ld

## 2012-05-18 NOTE — Telephone Encounter (Signed)
Crystal Williamson saw Dr. Dallas Schimke 05/11/12 for UTI.  She is having difficulty getting her current OBGYN's office to call her back and would like a referral to a more responsive or larger OBGYN practice.  She does not have any preference.  Currently is at Dr. Dois Davenport Rivard's practice. She would like a call back by Friday if possible. [phone].

## 2012-05-18 NOTE — Telephone Encounter (Signed)
Dr Patsy Lager, is there any OB/GYN you would prefer to refer pt to and within what time frame do you want her to be seen? I'll be glad to put the referral order in.

## 2012-05-19 ENCOUNTER — Telehealth: Payer: Self-pay

## 2012-05-19 NOTE — Telephone Encounter (Signed)
LM for pt that she needs to [facility] and let us know when her MRI is scheduled.  Pt will needs f/u with SR after MRI.  ld

## 2012-05-19 NOTE — Telephone Encounter (Signed)
Called pt and advised her that the referral has been started to new GYN.

## 2012-05-19 NOTE — Telephone Encounter (Signed)
Pt notified that she would need appt with SR as soon as MRI was done.  Pt states that she knew nothing of MRI.  Will consult with SR as to if MRI is to be scheduled by [place] or PCP.  ld

## 2012-05-20 ENCOUNTER — Ambulatory Visit: Payer: Commercial Managed Care - PPO | Admitting: Gynecology

## 2012-05-20 ENCOUNTER — Ambulatory Visit (INDEPENDENT_AMBULATORY_CARE_PROVIDER_SITE_OTHER): Payer: Commercial Managed Care - PPO | Admitting: Gynecology

## 2012-05-20 ENCOUNTER — Encounter: Payer: Self-pay | Admitting: Gynecology

## 2012-05-20 VITALS — BP 116/76 | Ht 67.0 in | Wt 233.0 lb

## 2012-05-20 DIAGNOSIS — N949 Unspecified condition associated with female genital organs and menstrual cycle: Secondary | ICD-10-CM

## 2012-05-20 DIAGNOSIS — IMO0001 Reserved for inherently not codable concepts without codable children: Secondary | ICD-10-CM | POA: Insufficient documentation

## 2012-05-20 DIAGNOSIS — N951 Menopausal and female climacteric states: Secondary | ICD-10-CM | POA: Insufficient documentation

## 2012-05-20 DIAGNOSIS — E278 Other specified disorders of adrenal gland: Secondary | ICD-10-CM

## 2012-05-20 DIAGNOSIS — D259 Leiomyoma of uterus, unspecified: Secondary | ICD-10-CM

## 2012-05-20 DIAGNOSIS — N938 Other specified abnormal uterine and vaginal bleeding: Secondary | ICD-10-CM

## 2012-05-20 NOTE — Progress Notes (Signed)
Patient is a 46 year old gravida 2 para 0 Ab2 (1 spontaneous AB, one elective AB. Patient has been suffering for many years with leiomyomatous uteri. She was recently diagnosed last year is being perimenopausal and was placed on hormone replacement therapy whereby she is currently on Estrace 2 mg daily with Prometrium 100 mg daily. She stated that since she was taking hormone replacement therapy she's been having brownish bloody discharge very light but daily. She has not had any further evaluation such as an endometrial biopsy. For many years she did suffer from heavy periods. She was recently treated by urinary tract infection and based on her symptoms her family Dr. ( Dr. Dallas Schimke) water a CT of the abdomen and pelvis with the following findings:  Uterus is enlarged, measuring approximately 13.5 x 7.2 x 11.7 cm.  At least one probable fibroid is identified anteriorly, measuring  7.2 cm. Probable sub centimeter Nabothian cyst in the cervix. No  pathologically enlarged lymph nodes. No worrisome lytic or  sclerotic lesions. No worrisome lytic or sclerotic lesions.  IMPRESSION:  1. No evidence of acute appendicitis.  2. Mild right hydronephrosis appears to be secondary to an  enlarged fibroid uterus.  3. Small left adrenal nodule cannot be definitively characterized  without IV contrast. In the absence of known malignancy, an  adenoma is favored.  We proceeded with doing an examination and a planned endometrial biopsy.  Exam: Abdomen: Soft nontender no rebound or guarding Pelvic: Bartholin urethra Skene was within normal limits Vagina: Some dark blood was noted in the vaginal vault Cervix: No lesions or discharge Uterus: Difficult to examine due to patient's abdominal girth Adnexa: As per above Rectal exam: Not done  The cervix was cleansed with Betadine solution and when the Pipelle was introduced into the intrauterine cavity patient jumped off the table and said she did not want the  procedure done. We are going to have the patient return back to the office next week for a transvaginal ultrasound for better assessment of the uterus and adnexa as well as an endometrial stripe. She stated her Pap smear early in August of this year was reported to be normal.  I will contact the urologist to see if any other additional study is needed for the small left adrenal nodule noted on the CT scan. Patient recently had a normal comprehensive metabolic panel and her CBC had a hemoglobin of 13.5 hematocrit 42.8 and white blood count 11.7 and normal platelet count.  Patient was provided with literature information on fibroid uterus as well as on total laparoscopic hysterectomy and we'll plan a course of management after the results are evaluated next week.

## 2012-05-20 NOTE — Patient Instructions (Addendum)
Fibroids You have been diagnosed as having a fibroid. Fibroids are smooth muscle lumps (tumors) which can occur any place in a woman's body. They are usually in the womb (uterus). The most common problem (symptom) of fibroids is bleeding. Over time this may cause low red blood cells (anemia). Other symptoms include feelings of pressure and pain in the pelvis. The diagnosis (learning what is wrong) of fibroids is made by physical exam. Sometimes tests such as an ultrasound are used. This is helpful when fibroids are felt around the ovaries and to look for tumors. TREATMENT   Most fibroids do not need surgical or medical treatment. Sometimes a tissue sample (biopsy) of the lining of the uterus is done to rule out cancer. If there is no cancer and only a small amount of bleeding, the problem can be watched.   Hormonal treatment can improve the problem.   When surgery is needed, it can consist of removing the fibroid. Vaginal birth may not be possible after the removal of fibroids. This depends on where they are and the extent of surgery. When pregnancy occurs with fibroids it is usually normal.   Your caregiver can help decide which treatments are best for you.  HOME CARE INSTRUCTIONS   Do not use aspirin as this may increase bleeding problems.   If your periods (menses) are heavy, record the number of pads or tampons used per month. Bring this information to your caregiver. This can help them determine the best treatment for you.  SEEK IMMEDIATE MEDICAL CARE IF:  You have pelvic pain or cramps not controlled with medications, or experience a sudden increase in pain.   You have an increase of pelvic bleeding between and during menses.   You feel lightheaded or have fainting spells.   You develop worsening belly (abdominal) pain.  Document Released: 09/04/2000 Document Revised: 08/27/2011 Document Reviewed: 04/26/2008 [facility] Patient Information 2012 [place], [place].  Hysterectomy  Information  A hysterectomy is a procedure where your uterus is surgically removed. It will no longer be possible to have menstrual periods or to become pregnant. The tubes and ovaries can be removed (bilateral salpingo-oopherectomy) during this surgery as well.  REASONS FOR A HYSTERECTOMY  Persistent, abnormal bleeding.   Lasting (chronic) pelvic pain or infection.   The lining of the uterus (endometrium) starts growing outside the uterus (endometriosis).   The endometrium starts growing in the muscle of the uterus (adenomyosis).   The uterus falls down into the vagina (pelvic organ prolapse).   Symptomatic uterine fibroids.   Precancerous cells.   Cervical cancer or uterine cancer.  TYPES OF HYSTERECTOMIES  Supracervical hysterectomy. This type removes the top part of the uterus, but not the cervix.   Total hysterectomy. This type removes the uterus and cervix.   Radical hysterectomy. This type removes the uterus, cervix, and the fibrous tissue that holds the uterus in place in the pelvis (parametrium).  WAYS A HYSTERECTOMY CAN BE PERFORMED  Abdominal hysterectomy. A large surgical cut (incision) is made in the abdomen. The uterus is removed through this incision.   Vaginal hysterectomy. An incision is made in the vagina. The uterus is removed through this incision. There are no abdominal incisions.   Conventional laparoscopic hysterectomy. A thin, lighted tube with a camera (laparoscope) is inserted into 3 or 4 small incisions in the abdomen. The uterus is cut into small pieces. The small pieces are removed through the incisions, or they are removed through the vagina.   Laparoscopic assisted vaginal  hysterectomy (LAVH). Three or four small incisions are made in the abdomen. Part of the surgery is performed laparoscopically and part vaginally. The uterus is removed through the vagina.   Robot-assisted laparoscopic hysterectomy. A laparoscope is inserted into 3 or 4 small  incisions in the abdomen. A computer-controlled device is used to give the surgeon a 3D image. This allows for more precise movements of surgical instruments. The uterus is cut into small pieces and removed through the incisions or removed through the vagina.  RISKS OF HYSTERECTOMY   Bleeding and risk of blood transfusion. Tell your caregiver if you do not want to receive any blood products.   Blood clots in the legs or lung.   Infection.   Injury to surrounding organs.   Anesthesia problems or side effects.   Conversion to an abdominal hysterectomy.  WHAT TO EXPECT AFTER A HYSTERECTOMY  You will be given pain medicine.   You will need to have someone with you for the first 3 to 5 days after you go home.   You will need to follow up with your surgeon in 2 to 4 weeks after surgery to evaluate your progress.   You may have early menopause symptoms like hot flashes, night sweats, and insomnia.   If you had a hysterectomy for a problem that was not a cancer or a condition that could lead to cancer, then you no longer need Pap tests. However, even if you no longer need a Pap test, a regular exam is a good idea to make sure no other problems are starting.  Document Released: 03/03/2001 Document Revised: 08/27/2011 Document Reviewed: 04/18/2011 [facility] Patient Information 2012 [place], [place].

## 2012-05-24 ENCOUNTER — Telehealth: Payer: Self-pay | Admitting: Gynecology

## 2012-05-24 NOTE — Telephone Encounter (Signed)
Patient informed that her hysterectomy has been scheduled for Weds.,Sept 11 1:00pm at [facility].

## 2012-05-24 NOTE — Telephone Encounter (Signed)
When I reviewed imaging tab in chart, there was a pending MRI. Now there is not!  Please schedule appointment with me for consultation ( 20-30 minutes)

## 2012-05-25 ENCOUNTER — Encounter: Payer: Self-pay | Admitting: Gynecology

## 2012-05-25 ENCOUNTER — Other Ambulatory Visit: Payer: Self-pay | Admitting: Gynecology

## 2012-05-25 ENCOUNTER — Ambulatory Visit (INDEPENDENT_AMBULATORY_CARE_PROVIDER_SITE_OTHER): Payer: Commercial Managed Care - PPO

## 2012-05-25 ENCOUNTER — Telehealth: Payer: Self-pay | Admitting: Gynecology

## 2012-05-25 ENCOUNTER — Telehealth: Payer: Self-pay

## 2012-05-25 ENCOUNTER — Ambulatory Visit (INDEPENDENT_AMBULATORY_CARE_PROVIDER_SITE_OTHER): Payer: Commercial Managed Care - PPO | Admitting: Gynecology

## 2012-05-25 ENCOUNTER — Encounter (HOSPITAL_COMMUNITY): Payer: Self-pay | Admitting: Pharmacist

## 2012-05-25 VITALS — BP 124/82

## 2012-05-25 DIAGNOSIS — Z01818 Encounter for other preprocedural examination: Secondary | ICD-10-CM

## 2012-05-25 DIAGNOSIS — D259 Leiomyoma of uterus, unspecified: Secondary | ICD-10-CM

## 2012-05-25 DIAGNOSIS — N938 Other specified abnormal uterine and vaginal bleeding: Secondary | ICD-10-CM

## 2012-05-25 DIAGNOSIS — E65 Localized adiposity: Secondary | ICD-10-CM

## 2012-05-25 DIAGNOSIS — N854 Malposition of uterus: Secondary | ICD-10-CM

## 2012-05-25 DIAGNOSIS — N949 Unspecified condition associated with female genital organs and menstrual cycle: Secondary | ICD-10-CM

## 2012-05-25 DIAGNOSIS — R52 Pain, unspecified: Secondary | ICD-10-CM

## 2012-05-25 DIAGNOSIS — N852 Hypertrophy of uterus: Secondary | ICD-10-CM

## 2012-05-25 DIAGNOSIS — L909 Atrophic disorder of skin, unspecified: Secondary | ICD-10-CM

## 2012-05-25 DIAGNOSIS — L919 Hypertrophic disorder of the skin, unspecified: Secondary | ICD-10-CM

## 2012-05-25 DIAGNOSIS — N951 Menopausal and female climacteric states: Secondary | ICD-10-CM

## 2012-05-25 DIAGNOSIS — IMO0001 Reserved for inherently not codable concepts without codable children: Secondary | ICD-10-CM

## 2012-05-25 MED ORDER — KETOROLAC TROMETHAMINE 30 MG/ML IJ SOLN
30.0000 mg | Freq: Once | INTRAMUSCULAR | Status: AC
  Administered 2012-05-25: 30 mg via INTRAMUSCULAR

## 2012-05-25 NOTE — Telephone Encounter (Signed)
Spoke with patient yesterday and scheduled her TLH for Weds., Sept 11 at 1:00pm at [facility].

## 2012-05-25 NOTE — Progress Notes (Signed)
Crystal Williamson is an 46-year-old gravida 2 para 0 Ab2 (1 spontaneous AB, one elective AB. Patient has been suffering for many years with leiomyomatous uteri. She was was placed on hormone replacement therapy last year by another provider in an effort to regulate her cycles and some of her symptoms whereby she was placed on Estrace 2 mg daily with Prometrium 100 mg daily. She stated that since she was taking hormone replacement therapy she's been having brownish bloody discharge very light but daily. She has not had any further evaluation such as an endometrial biopsy. For many years she did suffer from heavy periods. She was recently treated by urinary tract infection and based on her symptoms her family Dr. ( Dr. Copeland) ordered a CT of the abdomen and pelvis with the following findings:  Uterus is enlarged, measuring approximately 13.5 x 7.2 x 11.7 cm.  At least one probable fibroid is identified anteriorly, measuring  7.2 cm. Probable sub centimeter Nabothian cyst in the cervix. No  pathologically enlarged lymph nodes. No worrisome lytic or  sclerotic lesions. No worrisome lytic or sclerotic lesions.  IMPRESSION:  1. No evidence of acute appendicitis.  2. Mild right hydronephrosis appears to be secondary to an  enlarged fibroid uterus.  3. Small left adrenal nodule cannot be definitively characterized  without IV contrast. In the absence of known malignancy, an  adenoma is favored.  In 2010 patient had an ultrasound done by her previous provider with the following result:  Uterus is retroverted and retroflexed, measuring 16.1 x 10.4 x  10.4 cm (uterine volume 914 ml). Multiple shadowing hypoechoic  masses are again noted, likely fibroids, as follows:  Right lateral uterine body/fundus, intramural / submucosal, 5.4 x  4.2 x 3.7 cm  Anterior uterine body, predominately intramural, 4.6 x 5.2 x 4.3 cm  Uterine fundus, partly intramural / partly submucosal, 6.1 x 5.7 x  4.8 cm    Endometrium measures 6 mm in thickness. This is measured at the  mid body of the uterus as the fundal portion is distorted by  adjacent fibroids.  Right Ovary measures 3.2 x 2.8 x 2.8 cm. Normal.  Left Ovary measures 2.3 x 2.3 x 2.0 cm. Normal.   She presented to the office today for an attempted endometrial biopsy and for an ultrasound.  Patient underwent a paracervical block today with 1% lidocaine and an endometrial biopsy was obtained and tissue was submitted for histological evaluation result pending at time of this dictation.  The ultrasound today demonstrated uterus that measured 19.6 x 12.2 x 9 cm with a length of 17.2 cm. Intramural myomas equal 53 x 44 mm, 65 x 58 mm, 40 x 33 mm. Right ovary and left ovary not seen on transvaginal or transabdominal images. Her endometrial stripe was 10.9 mm  Pertinent Gynecological History: Menses: flow is light and with minimal cramping Bleeding: dysfunctional uterine bleeding Contraception: none DES exposure: denies Blood transfusions: none Sexually transmitted diseases: no past history Previous GYN Procedures: Dilatation and curettage?  Last mammogram: normal Date: 2012 Last pap: normal Date: 2013 OB History: G 2, P 0A 2   Menstrual History: Menarche age: 13 No LMP recorded.    Past Medical History  Diagnosis Date  . Fibroid     History reviewed. No pertinent past surgical history.  Family History  Problem Relation Age of Onset  . Colon cancer Paternal Grandfather   . Diabetes Paternal Grandfather     type 2   . Cancer Paternal Grandfather       COLON  . Breast cancer Paternal Grandmother   . Stroke Maternal Grandmother   . Lupus Mother   . Lumbar disc disease Mother     Social History:  reports that she has been smoking.  She does not have any smokeless tobacco history on file. She reports that she drinks about 10.5 ounces of alcohol per week. She reports that she does not use illicit drugs.  Allergies:  Allergies   Allergen Reactions  . Codeine Itching    REACTION: N \T\ V     (Not in a hospital admission)  REVIEW OF SYSTEMS: A ROS was performed and pertinent positives and negatives are included in the history.  GENERAL: No fevers or chills. HEENT: No change in vision, no earache, sore throat or sinus congestion. NECK: No pain or stiffness. CARDIOVASCULAR: No chest pain or pressure. No palpitations. PULMONARY: No shortness of breath, cough or wheeze. GASTROINTESTINAL: No abdominal pain, nausea, vomiting or diarrhea, melena or bright red blood per rectum. GENITOURINARY: No urinary frequency, urgency, hesitancy or dysuria. MUSCULOSKELETAL: No joint or muscle pain, no back pain, no recent trauma. DERMATOLOGIC: No rash, no itching, no lesions. ENDOCRINE: No polyuria, polydipsia, no heat or cold intolerance. No recent change in weight. HEMATOLOGICAL: No anemia or easy bruising or bleeding. NEUROLOGIC: No headache, seizures, numbness, tingling or weakness. PSYCHIATRIC: No depression, no loss of interest in normal activity or change in sleep pattern.     Blood pressure 124/82.  Physical Exam:  HEENT:unremarkable Neck:Supple, midline, no thyroid megaly, no carotid bruits Lungs:  Clear to auscultation no rhonchi's or wheezes Heart:Regular rate and rhythm, no murmurs or gallops Breast Exam: Not examined Abdomen: Pendulous Pelvic:BUS within normal limits Vagina: No lesions or discharge Cervix: No lesions or discharge Uterus: 18 week size Adnexa: Difficult to evaluate adnexa due to uterine size Extremities: No cords, no edema Rectal: Not done  No results found for this or any previous visit (from the past 24 hour(s)).  No results found.  Assessment/Plan: 46-year-old patient who last year was placed on Estrace 2 mg daily with the addition of Prometrium 100 mg daily in an effort to regulate her cycle? Patient does not recall ever having been informed that she was perimenopausal or menopausal. Patient  does state that she had felt better but has continued to have this on and off bleeding irregularity. This was the reason we did an endometrial biopsy today results are pending at time of this dictation. It appears that her uterus has increased in size over the course of the past several years. Based on the uterine dimensions we are going to proceed with total abdominal hysterectomy with ovarian conservation. Patient was instructed to discontinue the estrogen and progesterone and aspirin since her surgery scheduled for next week. I did discuss with the urologist the findings on the CT of the small adrenal cyst and he found that to be  in significant and warranted no further evaluation. The following risks were discussed with the patient:                         Patient was counseled as to the risk of surgery to include the following:  1. Infection (prohylactic antibiotics will be administered)  2. DVT/Pulmonary Embolism (prophylactic pneumo compression stockings will be used)  3.Trauma to internal organs requiring additional surgical procedure to repair any injury to     Internal organs requiring perhaps additional hospitalization days.  4.Hemmorhage requiring transfusion and blood products which carry   risks such as anaphylactic reaction, hepatitis and AIDS  Patient had received literature information on the procedure scheduled and all her questions were answered  and accepts all risk.  [facility] [facility] HMD6:36 PMTD@  Shanise Balch H 05/25/2012, 6:20 PM   

## 2012-05-25 NOTE — Telephone Encounter (Signed)
PC to pt to sch appt for consult.  Pt stated she has changed doctor office to Dr Lily Peer.  Will notify SR. ld

## 2012-05-26 ENCOUNTER — Encounter (HOSPITAL_COMMUNITY)
Admission: RE | Admit: 2012-05-26 | Discharge: 2012-05-26 | Disposition: A | Discharge status: Home / Self Care | Payer: Commercial Managed Care - PPO | Source: Ambulatory Visit | Attending: Gynecology | Admitting: Gynecology

## 2012-05-26 ENCOUNTER — Encounter: Payer: Self-pay | Admitting: Family Medicine

## 2012-05-26 ENCOUNTER — Other Ambulatory Visit: Payer: Self-pay | Admitting: Gynecology

## 2012-05-26 ENCOUNTER — Encounter (HOSPITAL_COMMUNITY): Payer: Self-pay

## 2012-05-26 ENCOUNTER — Telehealth: Payer: Self-pay | Admitting: Family Medicine

## 2012-05-26 HISTORY — DX: Other specified health status: Z78.9

## 2012-05-26 HISTORY — DX: Headache: R51

## 2012-05-26 LAB — SURGICAL PCR SCREEN
MRSA, PCR: NEGATIVE
Staphylococcus aureus: NEGATIVE

## 2012-05-26 LAB — URINALYSIS, ROUTINE W REFLEX MICROSCOPIC
Bilirubin Urine: NEGATIVE
Glucose, UA: NEGATIVE mg/dL
Ketones, ur: NEGATIVE mg/dL
Nitrite: NEGATIVE
Protein, ur: NEGATIVE mg/dL
Specific Gravity, Urine: 1.02 (ref 1.005–1.030)
Urobilinogen, UA: 0.2 mg/dL (ref 0.0–1.0)
pH: 5.5 (ref 5.0–8.0)

## 2012-05-26 LAB — URINE MICROSCOPIC-ADD ON

## 2012-05-26 LAB — CBC
Platelets: 228 10*3/uL (ref 150–400)
RBC: 4.19 MIL/uL (ref 3.87–5.11)
WBC: 11.6 10*3/uL — ABNORMAL HIGH (ref 4.0–10.5)

## 2012-05-26 NOTE — Patient Instructions (Addendum)
Your procedure is scheduled on:06/01/12  Enter through the Main Entrance at :1130am Pick up desk phone and dial [phone] and inform us of your arrival.  Please call [phone] if you have any problems the morning of surgery.  Remember: Do not eat after midnight:Tuesday Do not drink after: 9am on Wed- water only  Take these meds the morning of surgery with a sip of water: none  DO NOT wear jewelry, eye make-up, lipstick,body lotion, or dark fingernail polish. Do not shave for 48 hours prior to surgery.  If you are to be admitted after surgery, leave suitcase in car until your room has been assigned. Patients discharged on the day of surgery will not be allowed to drive home.   Remember to use your Hibiclens as instructed.

## 2012-05-26 NOTE — Telephone Encounter (Signed)
Called and went over further evaluation of her adrenal nodule- likely an adenoma.  She is having a hysterectomy next week, so she would like to defer further work- up until she has recovered.  Sent her a letter describing further work- up for her records, and she will contact me when she is ready to proceed

## 2012-05-27 ENCOUNTER — Telehealth: Payer: Self-pay | Admitting: *Deleted

## 2012-05-27 NOTE — Telephone Encounter (Signed)
Pt informed with the below note. 

## 2012-05-27 NOTE — Telephone Encounter (Signed)
It's okay for her to take the Flexeril

## 2012-05-27 NOTE — Telephone Encounter (Signed)
Pt is scheduled to have hysterectomy on 06/01/12 and c/o of back spasms she has rx for flexeril 10 mg that her chiropractor give her to use as needed. Pt asked if okay to take this medication? Please advise

## 2012-06-01 ENCOUNTER — Encounter (HOSPITAL_COMMUNITY): Payer: Self-pay | Admitting: Anesthesiology

## 2012-06-01 ENCOUNTER — Encounter (HOSPITAL_COMMUNITY): Payer: Self-pay

## 2012-06-01 ENCOUNTER — Encounter (HOSPITAL_COMMUNITY)
Admission: RE | Disposition: A | Discharge status: Home / Self Care | Payer: Self-pay | Source: Ambulatory Visit | Attending: Gynecology

## 2012-06-01 ENCOUNTER — Inpatient Hospital Stay (HOSPITAL_COMMUNITY): Payer: Commercial Managed Care - PPO | Admitting: Anesthesiology

## 2012-06-01 ENCOUNTER — Encounter (HOSPITAL_COMMUNITY): Payer: Self-pay | Admitting: *Deleted

## 2012-06-01 ENCOUNTER — Inpatient Hospital Stay (HOSPITAL_COMMUNITY)
Admission: RE | Admit: 2012-06-01 | Discharge: 2012-06-03 | DRG: 743 | Disposition: A | Discharge status: Home / Self Care | Payer: Commercial Managed Care - PPO | Source: Ambulatory Visit | Attending: Gynecology | Admitting: Gynecology

## 2012-06-01 DIAGNOSIS — D251 Intramural leiomyoma of uterus: Principal | ICD-10-CM | POA: Diagnosis present

## 2012-06-01 DIAGNOSIS — L299 Pruritus, unspecified: Secondary | ICD-10-CM | POA: Diagnosis present

## 2012-06-01 DIAGNOSIS — N938 Other specified abnormal uterine and vaginal bleeding: Secondary | ICD-10-CM

## 2012-06-01 DIAGNOSIS — N949 Unspecified condition associated with female genital organs and menstrual cycle: Secondary | ICD-10-CM

## 2012-06-01 DIAGNOSIS — Z01812 Encounter for preprocedural laboratory examination: Secondary | ICD-10-CM

## 2012-06-01 DIAGNOSIS — D252 Subserosal leiomyoma of uterus: Secondary | ICD-10-CM | POA: Diagnosis present

## 2012-06-01 DIAGNOSIS — Z01818 Encounter for other preprocedural examination: Secondary | ICD-10-CM

## 2012-06-01 DIAGNOSIS — D259 Leiomyoma of uterus, unspecified: Secondary | ICD-10-CM

## 2012-06-01 HISTORY — PX: ABDOMINAL HYSTERECTOMY: SHX81

## 2012-06-01 SURGERY — HYSTERECTOMY, ABDOMINAL
Anesthesia: General | Site: Abdomen | Wound class: Clean Contaminated

## 2012-06-01 MED ORDER — FENTANYL 10 MCG/ML IV SOLN
INTRAVENOUS | Status: DC
  Administered 2012-06-01: 17:00:00 via INTRAVENOUS
  Filled 2012-06-01: qty 50

## 2012-06-01 MED ORDER — SODIUM CHLORIDE 0.9 % IJ SOLN: 9.0000 mL | INTRAMUSCULAR | Status: DC | PRN

## 2012-06-01 MED ORDER — BUPIVACAINE HCL (PF) 0.5 % IJ SOLN
INTRAMUSCULAR | Status: AC
  Filled 2012-06-01: qty 150

## 2012-06-01 MED ORDER — NALOXONE HCL 0.4 MG/ML IJ SOLN: 0.4000 mg | INTRAMUSCULAR | Status: DC | PRN

## 2012-06-01 MED ORDER — FENTANYL 10 MCG/ML IV SOLN: INTRAVENOUS | Status: DC

## 2012-06-01 MED ORDER — HYDROMORPHONE HCL PF 1 MG/ML IJ SOLN
INTRAMUSCULAR | Status: AC
  Filled 2012-06-01: qty 1

## 2012-06-01 MED ORDER — MIDAZOLAM HCL 2 MG/2ML IJ SOLN
INTRAMUSCULAR | Status: AC
  Filled 2012-06-01: qty 2

## 2012-06-01 MED ORDER — ONDANSETRON HCL 4 MG/2ML IJ SOLN: 4.0000 mg | Freq: Four times a day (QID) | INTRAMUSCULAR | Status: DC | PRN

## 2012-06-01 MED ORDER — DIPHENHYDRAMINE HCL 50 MG/ML IJ SOLN: 12.5000 mg | Freq: Four times a day (QID) | INTRAMUSCULAR | Status: DC | PRN

## 2012-06-01 MED ORDER — BUPIVACAINE HCL (PF) 0.25 % IJ SOLN
INTRAMUSCULAR | Status: AC
  Filled 2012-06-01: qty 30

## 2012-06-01 MED ORDER — SCOPOLAMINE 1 MG/3DAYS TD PT72
1.0000 | MEDICATED_PATCH | TRANSDERMAL | Status: DC
  Administered 2012-06-01: 1.5 mg via TRANSDERMAL

## 2012-06-01 MED ORDER — ROCURONIUM BROMIDE 50 MG/5ML IV SOLN
INTRAVENOUS | Status: AC
  Filled 2012-06-01: qty 1

## 2012-06-01 MED ORDER — ROCURONIUM BROMIDE 100 MG/10ML IV SOLN
INTRAVENOUS | Status: DC | PRN
  Administered 2012-06-01: 45 mg via INTRAVENOUS
  Administered 2012-06-01: 5 mg via INTRAVENOUS
  Administered 2012-06-01 (×2): 10 mg via INTRAVENOUS

## 2012-06-01 MED ORDER — DIPHENHYDRAMINE HCL 50 MG/ML IJ SOLN
12.5000 mg | Freq: Once | INTRAMUSCULAR | Status: AC
  Administered 2012-06-01: 12.5 mg via INTRAVENOUS

## 2012-06-01 MED ORDER — KETOROLAC TROMETHAMINE 30 MG/ML IJ SOLN
INTRAMUSCULAR | Status: AC
  Filled 2012-06-01: qty 1

## 2012-06-01 MED ORDER — HYDROMORPHONE HCL PF 1 MG/ML IJ SOLN
INTRAMUSCULAR | Status: DC | PRN
  Administered 2012-06-01: 1 mg via INTRAVENOUS

## 2012-06-01 MED ORDER — DIPHENHYDRAMINE HCL 12.5 MG/5ML PO ELIX
12.5000 mg | ORAL_SOLUTION | Freq: Four times a day (QID) | ORAL | Status: DC | PRN
  Administered 2012-06-02: 12.5 mg via ORAL
  Filled 2012-06-01: qty 5

## 2012-06-01 MED ORDER — FENTANYL CITRATE 0.05 MG/ML IJ SOLN
INTRAMUSCULAR | Status: DC | PRN
  Administered 2012-06-01: 100 ug via INTRAVENOUS
  Administered 2012-06-01: 50 ug via INTRAVENOUS
  Administered 2012-06-01: 100 ug via INTRAVENOUS

## 2012-06-01 MED ORDER — LACTATED RINGERS IV SOLN
INTRAVENOUS | Status: DC
  Administered 2012-06-02: 02:00:00 via INTRAVENOUS

## 2012-06-01 MED ORDER — DIPHENHYDRAMINE HCL 12.5 MG/5ML PO ELIX: 12.5000 mg | ORAL_SOLUTION | Freq: Four times a day (QID) | ORAL | Status: DC | PRN

## 2012-06-01 MED ORDER — SODIUM CHLORIDE 0.9 % IJ SOLN
INTRAMUSCULAR | Status: DC | PRN
  Administered 2012-06-01: 50 mL

## 2012-06-01 MED ORDER — DIPHENHYDRAMINE HCL 50 MG/ML IJ SOLN
INTRAMUSCULAR | Status: AC
  Administered 2012-06-01: 12.5 mg via INTRAVENOUS
  Filled 2012-06-01: qty 1

## 2012-06-01 MED ORDER — LACTATED RINGERS IV BOLUS (SEPSIS): 250.0000 mL | Freq: Once | INTRAVENOUS | Status: DC

## 2012-06-01 MED ORDER — PROPOFOL 10 MG/ML IV EMUL
INTRAVENOUS | Status: DC | PRN
  Administered 2012-06-01: 200 mg via INTRAVENOUS

## 2012-06-01 MED ORDER — MIDAZOLAM HCL 5 MG/5ML IJ SOLN
INTRAMUSCULAR | Status: DC | PRN
  Administered 2012-06-01: 2 mg via INTRAVENOUS

## 2012-06-01 MED ORDER — FENTANYL CITRATE 0.05 MG/ML IJ SOLN
INTRAMUSCULAR | Status: AC
  Filled 2012-06-01: qty 5

## 2012-06-01 MED ORDER — LIDOCAINE HCL (CARDIAC) 20 MG/ML IV SOLN
INTRAVENOUS | Status: DC | PRN
  Administered 2012-06-01 (×2): 50 mg via INTRAVENOUS

## 2012-06-01 MED ORDER — CEFAZOLIN SODIUM-DEXTROSE 2-3 GM-% IV SOLR
INTRAVENOUS | Status: AC
  Filled 2012-06-01: qty 50

## 2012-06-01 MED ORDER — VASOPRESSIN 20 UNIT/ML IJ SOLN
INTRAMUSCULAR | Status: DC | PRN
  Administered 2012-06-01: 10 [IU]

## 2012-06-01 MED ORDER — PROPOFOL 10 MG/ML IV EMUL
INTRAVENOUS | Status: AC
  Filled 2012-06-01: qty 20

## 2012-06-01 MED ORDER — CEFAZOLIN SODIUM-DEXTROSE 2-3 GM-% IV SOLR
2.0000 g | INTRAVENOUS | Status: AC
  Administered 2012-06-01: 2 g via INTRAVENOUS

## 2012-06-01 MED ORDER — NEOSTIGMINE METHYLSULFATE 1 MG/ML IJ SOLN
INTRAMUSCULAR | Status: DC | PRN
  Administered 2012-06-01: 4 mg via INTRAVENOUS

## 2012-06-01 MED ORDER — DEXAMETHASONE SODIUM PHOSPHATE 10 MG/ML IJ SOLN
INTRAMUSCULAR | Status: AC
  Filled 2012-06-01: qty 1

## 2012-06-01 MED ORDER — HYDROMORPHONE 0.3 MG/ML IV SOLN: INTRAVENOUS | Status: DC

## 2012-06-01 MED ORDER — ESTRADIOL 1 MG PO TABS
2.0000 mg | ORAL_TABLET | Freq: Every day | ORAL | Status: DC
  Administered 2012-06-02: 2 mg via ORAL
  Filled 2012-06-01 (×3): qty 2

## 2012-06-01 MED ORDER — LACTATED RINGERS IV SOLN
INTRAVENOUS | Status: DC
  Administered 2012-06-01 (×2): via INTRAVENOUS

## 2012-06-01 MED ORDER — LIDOCAINE HCL (CARDIAC) 20 MG/ML IV SOLN
INTRAVENOUS | Status: AC
  Filled 2012-06-01: qty 5

## 2012-06-01 MED ORDER — NEOSTIGMINE METHYLSULFATE 1 MG/ML IJ SOLN
INTRAMUSCULAR | Status: AC
  Filled 2012-06-01: qty 10

## 2012-06-01 MED ORDER — DEXAMETHASONE SODIUM PHOSPHATE 4 MG/ML IJ SOLN
INTRAMUSCULAR | Status: DC | PRN
  Administered 2012-06-01: 10 mg via INTRAVENOUS

## 2012-06-01 MED ORDER — BUPIVACAINE LIPOSOME 1.3 % IJ SUSP
20.0000 mL | Freq: Once | INTRAMUSCULAR | Status: AC
  Administered 2012-06-01: 20 mL
  Filled 2012-06-01: qty 20

## 2012-06-01 MED ORDER — ONDANSETRON HCL 4 MG/2ML IJ SOLN
INTRAMUSCULAR | Status: DC | PRN
  Administered 2012-06-01: 4 mg via INTRAVENOUS

## 2012-06-01 MED ORDER — GLYCOPYRROLATE 0.2 MG/ML IJ SOLN
INTRAMUSCULAR | Status: AC
  Filled 2012-06-01: qty 1

## 2012-06-01 MED ORDER — HYDROMORPHONE 0.3 MG/ML IV SOLN
INTRAVENOUS | Status: DC
  Administered 2012-06-01: 19:00:00 via INTRAVENOUS
  Administered 2012-06-01: 1.5 mg via INTRAVENOUS
  Administered 2012-06-02: 1.2 mg via INTRAVENOUS
  Filled 2012-06-01: qty 25

## 2012-06-01 MED ORDER — METOCLOPRAMIDE HCL 5 MG/ML IJ SOLN
INTRAMUSCULAR | Status: AC
  Administered 2012-06-01: 10 mg via INTRAVENOUS
  Filled 2012-06-01: qty 2

## 2012-06-01 MED ORDER — ONDANSETRON HCL 4 MG/2ML IJ SOLN
INTRAMUSCULAR | Status: AC
  Filled 2012-06-01: qty 2

## 2012-06-01 MED ORDER — KETOROLAC TROMETHAMINE 15 MG/ML IJ SOLN
INTRAMUSCULAR | Status: DC | PRN
  Administered 2012-06-01: 30 mg via INTRAVENOUS

## 2012-06-01 MED ORDER — SCOPOLAMINE 1 MG/3DAYS TD PT72
MEDICATED_PATCH | TRANSDERMAL | Status: AC
  Filled 2012-06-01: qty 1

## 2012-06-01 MED ORDER — METOCLOPRAMIDE HCL 5 MG/ML IJ SOLN
10.0000 mg | Freq: Once | INTRAMUSCULAR | Status: AC | PRN
  Administered 2012-06-01: 10 mg via INTRAVENOUS

## 2012-06-01 MED ORDER — VASOPRESSIN 20 UNIT/ML IJ SOLN
INTRAMUSCULAR | Status: AC
  Filled 2012-06-01: qty 1

## 2012-06-01 MED ORDER — GLYCOPYRROLATE 0.2 MG/ML IJ SOLN
INTRAMUSCULAR | Status: DC | PRN
  Administered 2012-06-01: .8 mg via INTRAVENOUS

## 2012-06-01 MED ORDER — HYDROMORPHONE HCL PF 1 MG/ML IJ SOLN
0.2500 mg | INTRAMUSCULAR | Status: DC | PRN
  Administered 2012-06-01 (×3): 0.5 mg via INTRAVENOUS

## 2012-06-01 MED ORDER — MEPERIDINE HCL 25 MG/ML IJ SOLN: 6.2500 mg | INTRAMUSCULAR | Status: DC | PRN

## 2012-06-01 SURGICAL SUPPLY — 43 items
BARRIER ADHS 3X4 INTERCEED (GAUZE/BANDAGES/DRESSINGS) IMPLANT
CANISTER SUCTION 2500CC (MISCELLANEOUS) ×2 IMPLANT
CATH KIT ON Q 5IN DUAL SLV (PAIN MANAGEMENT) IMPLANT
CELLS DAT CNTRL 66122 CELL SVR (MISCELLANEOUS) IMPLANT
CLOTH BEACON ORANGE TIMEOUT ST (SAFETY) ×2 IMPLANT
DECANTER SPIKE VIAL GLASS SM (MISCELLANEOUS) IMPLANT
DRESSING XEROFORM 5X9 (GAUZE/BANDAGES/DRESSINGS) ×2 IMPLANT
DRSG COVADERM 4X10 (GAUZE/BANDAGES/DRESSINGS) ×2 IMPLANT
DRSG TEGADERM 2.38X2.75 (GAUZE/BANDAGES/DRESSINGS) IMPLANT
DRSG XEROFORM 1X8 (GAUZE/BANDAGES/DRESSINGS) ×2 IMPLANT
GAUZE SPONGE 4X4 12PLY STRL LF (GAUZE/BANDAGES/DRESSINGS) ×4 IMPLANT
GLOVE BIOGEL PI IND STRL 8 (GLOVE) ×1 IMPLANT
GLOVE BIOGEL PI INDICATOR 8 (GLOVE) ×1
GLOVE ECLIPSE 7.5 STRL STRAW (GLOVE) ×4 IMPLANT
GOWN PREVENTION PLUS LG XLONG (DISPOSABLE) ×6 IMPLANT
NEEDLE HYPO 25X1 1.5 SAFETY (NEEDLE) IMPLANT
NS IRRIG 1000ML POUR BTL (IV SOLUTION) ×2 IMPLANT
PACK ABDOMINAL GYN (CUSTOM PROCEDURE TRAY) ×2 IMPLANT
PAD OB MATERNITY 4.3X12.25 (PERSONAL CARE ITEMS) ×2 IMPLANT
PROTECTOR NERVE ULNAR (MISCELLANEOUS) ×2 IMPLANT
RETRACTOR WND ALEXIS 25 LRG (MISCELLANEOUS) IMPLANT
RTRCTR WOUND ALEXIS 18CM MED (MISCELLANEOUS)
RTRCTR WOUND ALEXIS 25CM LRG (MISCELLANEOUS)
SPONGE LAP 18X18 X RAY DECT (DISPOSABLE) ×4 IMPLANT
STAPLER VISISTAT 35W (STAPLE) ×2 IMPLANT
STRIP CLOSURE SKIN 1/2X4 (GAUZE/BANDAGES/DRESSINGS) IMPLANT
SUT CHROMIC 3 0 SH 27 (SUTURE) IMPLANT
SUT VIC AB 0 CT1 18XCR BRD8 (SUTURE) ×3 IMPLANT
SUT VIC AB 0 CT1 36 (SUTURE) IMPLANT
SUT VIC AB 0 CT1 8-18 (SUTURE) ×3
SUT VIC AB 1 CT1 18XBRD ANBCTR (SUTURE) IMPLANT
SUT VIC AB 1 CT1 8-18 (SUTURE)
SUT VIC AB 3-0 CT1 27 (SUTURE) ×2
SUT VIC AB 3-0 CT1 TAPERPNT 27 (SUTURE) ×2 IMPLANT
SUT VIC AB 3-0 SH 27 (SUTURE) ×1
SUT VIC AB 3-0 SH 27X BRD (SUTURE) ×1 IMPLANT
SUT VICRYL 0 TIES 12 18 (SUTURE) ×2 IMPLANT
SUT VICRYL 3 0 BR 18  UND (SUTURE)
SUT VICRYL 3 0 BR 18 UND (SUTURE) IMPLANT
SYR CONTROL 10ML LL (SYRINGE) IMPLANT
TOWEL OR 17X24 6PK STRL BLUE (TOWEL DISPOSABLE) ×4 IMPLANT
TRAY FOLEY CATH 14FR (SET/KITS/TRAYS/PACK) ×2 IMPLANT
WATER STERILE IRR 1000ML POUR (IV SOLUTION) ×2 IMPLANT

## 2012-06-01 NOTE — Interval H&P Note (Signed)
History and Physical Interval Note:  06/01/2012 12:47 PM  Crystal Williamson  has presented today for surgery, with the diagnosis of fibroids  The various methods of treatment have been discussed with the patient and family. After consideration of risks, benefits and other options for treatment, the patient has consented to  Procedure(s) (LRB) with comments: HYSTERECTOMY ABDOMINAL (N/A) as a surgical intervention .  The patient's history has been reviewed, patient examined, no change in status, stable for surgery.  I have reviewed the patient's chart and labs.  Questions were answered to the patient's satisfaction.     Ok Edwards

## 2012-06-01 NOTE — Op Note (Signed)
06/01/2012  3:44 PM  PATIENT:  Crystal Williamson  46 y.o. female  PRE-OPERATIVE DIAGNOSIS: Symptomatic leiomyomatous uteri POST-OPERATIVE DIAGNOSIS:  Symptomatic leiomyomatous uteri  PROCEDURE:  Procedure(s): HYSTERECTOMY ABDOMINAL  SURGEON:  Surgeon(s): Ok Edwards, MD Dara Lords, MD  ANESTHESIA:   general  FINDINGS: A 16 week size multi-lobulated uterus. Normal-appearing ovaries and fallopian tubes and appendix  DESCRIPTION OF OPERATION:The patient was taken to the operating room where she underwent successful general endotracheal anesthesia. A time out procedure was performed prior to start time and agreed upon by all OR Staff. Confirmation of the correct patient, procedure and site were established. The abdomen and vagina were prepped and draped in usual sterile fashion. A Foley catheter was inserted to monitor urinary output. After the drapes were placed a Pfannenstiel skin incision was made 2 cm above the symphysis pubis and the incision was carried down from the skin down to the subcutaneous tissue down to the rectus fascia were midline nick was made in the fascia was incised in a transverse fashion. The peritoneal cavity was entered cautiously. The patient was placed in Trendelenburg after retractors were in place. Inspection of the pelvic cavity demonstrated a multilobulated uterus with large intramural myomas measuring at least 8 cm in size. Pitressin was used 1 unit per 10 cc for a total approximately 15 cc injected into the fundal fibroids and posterior fibroids. With the use of the Bovie tip the 2 large fibroids were enucleated and passed off the operative field  thus decompressing the uterus to allow visualization of the adnexa to proceed with a hysterectomy. The left round ligament was identified and a transfixation suture 0 Vicryl was placed. The round ligament was then transected with the Bovie. With the surgeon's finger the posterior broad ligament was penetrated  and the proximal fallopian tube and utero-ovarian ligament were clamped cut and suture ligated with 0 Vicryl suture followed by transfixation stitch of 0 Vicryl suture and   leaving the tube and ovary behind. Similar procedure was carried on the contralateral side. The broad and cardinal ligaments were serially clamped cut and suture ligated with 0 Vicryl suture and the bladder was peeled off from the lower uterine segment. The left and right vaginal fornix were clamped cut and suture ligated with 0 Vicryl suture. The remaining cervix had been removed with the use of the Jorgenson scissors and the remaining vaginal cuff was reapproximated with interrupted sutures of 0 Vicryl suture. The pelvic cavity was copious irrigated with normal saline solution. Each of the following layers that were close were infiltrated with Exparel (bupivacaine 1.3%) 20 cc diluted in 30 cc of normal saline for postop analgesia. 20 cc were totally use. Sponge count needle count were correct the visceral peritoneum was reapproximated with a running stitch of 3-0 Vicryl suture the lower portion of the abdominal is rectus muscle was reapproximated with interrupted sutures of 3-0 Vicryl suture the rectus fascia was then closed with a running stitch of 0 Vicryl suture. The subcutaneous fat was reapproximated with a running stitch of 2-0 plain suture. The skin was reapproximated with skin clips followed by placement of Xeroform gauze and 4 x 4 dressing. The patient was extubated transferred to recovery room stable vital signs. Patient received Toradol 30 mg IV.  ESTIMATED BLOOD LOSS: 200 cc and  Intake/Output Summary (Last 24 hours) at 06/01/12 1544 Last data filed at 06/01/12 1459  Gross per 24 hour  Intake   1400 ml  Output    430 ml  Net    970 ml     BLOOD ADMINISTERED:none   LOCAL MEDICATIONS USED:  BUPIVICAINE   SPECIMEN:  Source of Specimen:  Uterus and cervix  DISPOSITION OF SPECIMEN:  PATHOLOGY  COUNTS:  YES  PLAN OF  CARE: Transfer to PACU  [facility] HMD3:44 PMTD@

## 2012-06-01 NOTE — Anesthesia Procedure Notes (Signed)
Procedure Name: Intubation Date/Time: 06/01/2012 1:12 PM Performed by: Isabella Bowens R Pre-anesthesia Checklist: Patient identified, Emergency Drugs available, Suction available, Timeout performed and Patient being monitored Patient Re-evaluated:Patient Re-evaluated prior to inductionOxygen Delivery Method: Circle system utilized Preoxygenation: Pre-oxygenation with 100% oxygen Intubation Type: IV induction Laryngoscope Size: Mac and 3 Grade View: Grade III Tube type: Oral Tube size: 7.0 mm Number of attempts: 1 Airway Equipment and Method: Patient positioned with wedge pillow and Stylet Placement Confirmation: positive ETCO2 and breath sounds checked- equal and bilateral Secured at: 21 cm Tube secured with: Tape Dental Injury: Teeth and Oropharynx as per pre-operative assessment  Difficulty Due To: Difficulty was anticipated and Difficult Airway- due to anterior larynx

## 2012-06-01 NOTE — Anesthesia Postprocedure Evaluation (Signed)
Anesthesia Post Note  Patient: Crystal Williamson  Procedure(s) Performed: Procedure(s) (LRB): HYSTERECTOMY ABDOMINAL (N/A)  Anesthesia type: General  Patient location: PACU  Post pain: Pain level controlled  Post assessment: Post-op Vital signs reviewed  Last Vitals:  Filed Vitals:   06/01/12 1551  BP:   Pulse: 83  Temp:   Resp: 11    Post vital signs: Reviewed  Level of consciousness: sedated  Complications: No apparent anesthesia complicationsfj

## 2012-06-01 NOTE — Anesthesia Preprocedure Evaluation (Signed)
Anesthesia Evaluation  Patient identified by MRN, date of birth, ID band Patient awake    Reviewed: Allergy & Precautions, H&P , NPO status , Patient's Chart, lab work & pertinent test results  Airway Mallampati: III TM Distance: >3 FB     Dental No notable dental hx. (+) Teeth Intact   Pulmonary neg pulmonary ROS,  Snores- ?undiagnosed OSA breath sounds clear to auscultation  Pulmonary exam normal       Cardiovascular negative cardio ROS  Rhythm:Regular Rate:Normal     Neuro/Psych  Headaches, negative psych ROS   GI/Hepatic negative GI ROS, Neg liver ROS,   Endo/Other  Morbid obesity  Renal/GU negative Renal ROS  negative genitourinary   Musculoskeletal negative musculoskeletal ROS (+)   Abdominal Normal abdominal exam  (+) + obese,   Peds  Hematology negative hematology ROS (+)   Anesthesia Other Findings   Reproductive/Obstetrics Fibroid uterus                           Anesthesia Physical Anesthesia Plan  ASA: II  Anesthesia Plan: General   Post-op Pain Management:    Induction: Intravenous  Airway Management Planned: Oral ETT  Additional Equipment:   Intra-op Plan:   Post-operative Plan: Extubation in OR  Informed Consent: I have reviewed the patients History and Physical, chart, labs and discussed the procedure including the risks, benefits and alternatives for the proposed anesthesia with the patient or authorized representative who has indicated his/her understanding and acceptance.   Dental advisory given  Plan Discussed with: CRNA, Anesthesiologist and Surgeon  Anesthesia Plan Comments:         Anesthesia Quick Evaluation

## 2012-06-01 NOTE — H&P (View-Only) (Signed)
Crystal Williamson is an 46 year old gravida 2 para 0 Ab2 (1 spontaneous AB, one elective AB. Patient has been suffering for many years with leiomyomatous uteri. She was was placed on hormone replacement therapy last year by another provider in an effort to regulate her cycles and some of her symptoms whereby she was placed on Estrace 2 mg daily with Prometrium 100 mg daily. She stated that since she was taking hormone replacement therapy she's been having brownish bloody discharge very light but daily. She has not had any further evaluation such as an endometrial biopsy. For many years she did suffer from heavy periods. She was recently treated by urinary tract infection and based on her symptoms her family Dr. ( Dr. Dallas Schimke) ordered a CT of the abdomen and pelvis with the following findings:  Uterus is enlarged, measuring approximately 13.5 x 7.2 x 11.7 cm.  At least one probable fibroid is identified anteriorly, measuring  7.2 cm. Probable sub centimeter Nabothian cyst in the cervix. No  pathologically enlarged lymph nodes. No worrisome lytic or  sclerotic lesions. No worrisome lytic or sclerotic lesions.  IMPRESSION:  1. No evidence of acute appendicitis.  2. Mild right hydronephrosis appears to be secondary to an  enlarged fibroid uterus.  3. Small left adrenal nodule cannot be definitively characterized  without IV contrast. In the absence of known malignancy, an  adenoma is favored.  In 2010 patient had an ultrasound done by her previous provider with the following result:  Uterus is retroverted and retroflexed, measuring 16.1 x 10.4 x  10.4 cm (uterine volume 914 ml). Multiple shadowing hypoechoic  masses are again noted, likely fibroids, as follows:  Right lateral uterine body/fundus, intramural / submucosal, 5.4 x  4.2 x 3.7 cm  Anterior uterine body, predominately intramural, 4.6 x 5.2 x 4.3 cm  Uterine fundus, partly intramural / partly submucosal, 6.1 x 5.7 x  4.8 cm    Endometrium measures 6 mm in thickness. This is measured at the  mid body of the uterus as the fundal portion is distorted by  adjacent fibroids.  Right Ovary measures 3.2 x 2.8 x 2.8 cm. Normal.  Left Ovary measures 2.3 x 2.3 x 2.0 cm. Normal.   She presented to the office today for an attempted endometrial biopsy and for an ultrasound.  Patient underwent a paracervical block today with 1% lidocaine and an endometrial biopsy was obtained and tissue was submitted for histological evaluation result pending at time of this dictation.  The ultrasound today demonstrated uterus that measured 19.6 x 12.2 x 9 cm with a length of 17.2 cm. Intramural myomas equal 53 x 44 mm, 65 x 58 mm, 40 x 33 mm. Right ovary and left ovary not seen on transvaginal or transabdominal images. Her endometrial stripe was 10.9 mm  Pertinent Gynecological History: Menses: flow is light and with minimal cramping Bleeding: dysfunctional uterine bleeding Contraception: none DES exposure: denies Blood transfusions: none Sexually transmitted diseases: no past history Previous GYN Procedures: Dilatation and curettage?  Last mammogram: normal Date: 2012 Last pap: normal Date: 2013 OB History: G 2, P 0A 2   Menstrual History: Menarche age: 46 No LMP recorded.    Past Medical History  Diagnosis Date  . Fibroid     History reviewed. No pertinent past surgical history.  Family History  Problem Relation Age of Onset  . Colon cancer Paternal Grandfather   . Diabetes Paternal Grandfather     type 2   . Cancer Paternal Grandfather  COLON  . Breast cancer Paternal Grandmother   . Stroke Maternal Grandmother   . Lupus Mother   . Lumbar disc disease Mother     Social History:  reports that she has been smoking.  She does not have any smokeless tobacco history on file. She reports that she drinks about 10.5 ounces of alcohol per week. She reports that she does not use illicit drugs.  Allergies:  Allergies   Allergen Reactions  . Codeine Itching    REACTION: N \\T \ V     (Not in a hospital admission)  REVIEW OF SYSTEMS: A ROS was performed and pertinent positives and negatives are included in the history.  GENERAL: No fevers or chills. HEENT: No change in vision, no earache, sore throat or sinus congestion. NECK: No pain or stiffness. CARDIOVASCULAR: No chest pain or pressure. No palpitations. PULMONARY: No shortness of breath, cough or wheeze. GASTROINTESTINAL: No abdominal pain, nausea, vomiting or diarrhea, melena or bright red blood per rectum. GENITOURINARY: No urinary frequency, urgency, hesitancy or dysuria. MUSCULOSKELETAL: No joint or muscle pain, no back pain, no recent trauma. DERMATOLOGIC: No rash, no itching, no lesions. ENDOCRINE: No polyuria, polydipsia, no heat or cold intolerance. No recent change in weight. HEMATOLOGICAL: No anemia or easy bruising or bleeding. NEUROLOGIC: No headache, seizures, numbness, tingling or weakness. PSYCHIATRIC: No depression, no loss of interest in normal activity or change in sleep pattern.     Blood pressure 124/82.  Physical Exam:  HEENT:unremarkable Neck:Supple, midline, no thyroid megaly, no carotid bruits Lungs:  Clear to auscultation no rhonchi's or wheezes Heart:Regular rate and rhythm, no murmurs or gallops Breast Exam: Not examined Abdomen: Pendulous Pelvic:BUS within normal limits Vagina: No lesions or discharge Cervix: No lesions or discharge Uterus: 18 week size Adnexa: Difficult to evaluate adnexa due to uterine size Extremities: No cords, no edema Rectal: Not done  No results found for this or any previous visit (from the past 24 hour(s)).  No results found.  Assessment/Plan: 46 year old patient who last year was placed on Estrace 2 mg daily with the addition of Prometrium 100 mg daily in an effort to regulate her cycle? Patient does not recall ever having been informed that she was perimenopausal or menopausal. Patient  does state that she had felt better but has continued to have this on and off bleeding irregularity. This was the reason we did an endometrial biopsy today results are pending at time of this dictation. It appears that her uterus has increased in size over the course of the past several years. Based on the uterine dimensions we are going to proceed with total abdominal hysterectomy with ovarian conservation. Patient was instructed to discontinue the estrogen and progesterone and aspirin since her surgery scheduled for next week. I did discuss with the urologist the findings on the CT of the small adrenal cyst and he found that to be  in significant and warranted no further evaluation. The following risks were discussed with the patient:                         Patient was counseled as to the risk of surgery to include the following:  1. Infection (prohylactic antibiotics will be administered)  2. DVT/Pulmonary Embolism (prophylactic pneumo compression stockings will be used)  3.Trauma to internal organs requiring additional surgical procedure to repair any injury to     Internal organs requiring perhaps additional hospitalization days.  4.Hemmorhage requiring transfusion and blood products which carry  risks such as anaphylactic reaction, hepatitis and AIDS  Patient had received literature information on the procedure scheduled and all her questions were answered  and accepts all risk.  [facility] HMD6:36 PMTD@  Ok Edwards 05/25/2012, 6:20 PM

## 2012-06-01 NOTE — Transfer of Care (Signed)
Immediate Anesthesia Transfer of Care Note  Patient: Crystal Williamson  Procedure(s) Performed: Procedure(s) (LRB) with comments: HYSTERECTOMY ABDOMINAL (N/A)  Patient Location: PACU  Anesthesia Type: General  Level of Consciousness: awake, alert  and oriented  Airway & Oxygen Therapy: Patient Spontanous Breathing and Patient connected to nasal cannula oxygen  Post-op Assessment: Report given to PACU RN and Post -op Vital signs reviewed and stable  Post vital signs: Reviewed and stable  Complications: No apparent anesthesia complications

## 2012-06-02 ENCOUNTER — Encounter (HOSPITAL_COMMUNITY): Payer: Self-pay | Admitting: Gynecology

## 2012-06-02 LAB — CBC
Hemoglobin: 12.2 g/dL (ref 12.0–15.0)
MCH: 32.5 pg (ref 26.0–34.0)
MCHC: 33.3 g/dL (ref 30.0–36.0)
MCV: 97.6 fL (ref 78.0–100.0)
RBC: 3.75 MIL/uL — ABNORMAL LOW (ref 3.87–5.11)

## 2012-06-02 MED ORDER — MEPERIDINE HCL 50 MG PO TABS
50.0000 mg | ORAL_TABLET | ORAL | Status: DC | PRN
Start: 2012-06-02 — End: 2012-06-03
  Administered 2012-06-02 – 2012-06-03 (×6): 50 mg via ORAL
  Filled 2012-06-02 (×6): qty 1

## 2012-06-02 MED ORDER — IBUPROFEN 800 MG PO TABS
800.0000 mg | ORAL_TABLET | Freq: Three times a day (TID) | ORAL | Status: DC
  Administered 2012-06-02 – 2012-06-03 (×2): 800 mg via ORAL
  Filled 2012-06-02 (×2): qty 1

## 2012-06-02 MED ORDER — IBUPROFEN 800 MG PO TABS: 800.0000 mg | ORAL_TABLET | Freq: Three times a day (TID) | ORAL | Status: DC

## 2012-06-02 MED ORDER — SIMETHICONE 80 MG PO CHEW: 80.0000 mg | CHEWABLE_TABLET | Freq: Four times a day (QID) | ORAL | Status: DC | PRN

## 2012-06-02 MED ORDER — LACTATED RINGERS IV SOLN: INTRAVENOUS | Status: DC

## 2012-06-02 MED ORDER — DIPHENHYDRAMINE HCL 25 MG PO CAPS
25.0000 mg | ORAL_CAPSULE | Freq: Once | ORAL | Status: AC
  Administered 2012-06-02: 25 mg via ORAL
  Filled 2012-06-02: qty 1

## 2012-06-02 NOTE — Progress Notes (Signed)
UR Chart review completed.  

## 2012-06-02 NOTE — Anesthesia Postprocedure Evaluation (Signed)
  Anesthesia Post-op Note  Patient: Tyree Lanier-Eddings  Procedure(s) Performed: Procedure(s) (LRB) with comments: HYSTERECTOMY ABDOMINAL (N/A)  Patient Location: [facility]  Anesthesia Type: General  Level of Consciousness: alert , oriented and patient cooperative  Airway and Oxygen Therapy: Patient Spontanous Breathing and Patient connected to nasal cannula oxygen  Post-op Pain: none  Post-op Assessment: Patient's Cardiovascular Status Stable, Respiratory Function Stable, No signs of Nausea or vomiting, Adequate PO intake and Pain level controlled  Post-op Vital Signs: Reviewed and stable  Complications: No apparent anesthesia complications

## 2012-06-02 NOTE — Progress Notes (Signed)
   Subjective: Patient reports tolerating PO and no problems voiding.  Some pruritus  Objective: I have reviewed  vital signs, intake and output and labs. Patient's postop hemoglobin: 12.2 g  Filed Vitals:   06/02/12 0600  BP: 107/62  Pulse: 80  Temp: 98.3 F (36.8 C)  Resp: 18   I/O last 3 completed shifts: In: 5820 [P.O.:2520; I.V.:3300] Out: 2255 [Urine:2055; Blood:200] Total I/O In: -  Out: 150 [Urine:150]  Results for orders placed during the hospital encounter of 06/01/12 (from the past 24 hour(s))  PREGNANCY, URINE     Status: Normal   Collection Time   06/01/12 11:21 AM      Component Value Range   Preg Test, Ur NEGATIVE  NEGATIVE    EXAM General: alert and cooperative Resp: clear to auscultation bilaterally Cardio: regular rate and rhythm, S1, S2 normal, no murmur, click, rub or gallop GI: soft, non-tender; bowel sounds normal; no masses,  no organomegaly, incision site intact Extremities: extremities normal, atraumatic, no cyanosis or edema Vaginal Bleeding: none  Assessment: s/p Procedure(s): HYSTERECTOMY ABDOMINAL: stable, progressing well and Patient had pruritus with Dilaudid and fentanyl PCA pump  Plan: Advance diet Encourage ambulation Advance to PO medication Do to codeine allergies and recent sensitivity to fentanyl and dillaudid patient will be started on Demerol 50 mg q. 4-6 hours when necessary pain  LOS: 1 day    Ok Edwards, MD 06/02/2012 8:29 AM    06/02/2012, 8:29 AM

## 2012-06-02 NOTE — Addendum Note (Signed)
Addendum  created 06/02/12 0817 by Suella Grove, CRNA   Modules edited:Notes Section

## 2012-06-03 MED ORDER — IBUPROFEN 800 MG PO TABS: 800.0000 mg | ORAL_TABLET | Freq: Three times a day (TID) | ORAL | Status: AC

## 2012-06-03 MED ORDER — METOCLOPRAMIDE HCL 10 MG PO TABS: ORAL_TABLET | ORAL | Status: DC

## 2012-06-03 MED ORDER — ESTRADIOL 2 MG PO TABS: 2.0000 mg | ORAL_TABLET | Freq: Every day | ORAL | Status: DC

## 2012-06-03 MED ORDER — MEPERIDINE HCL 50 MG PO TABS: 50.0000 mg | ORAL_TABLET | ORAL | Status: DC | PRN

## 2012-06-03 NOTE — Discharge Summary (Signed)
Physician Discharge Summary  Patient ID: Crystal Williamson MRN: [id number] DOB/AGE: 45/17/1967 45 y.o.  Admit date: 06/01/2012 Discharge date: 06/03/2012  Admission Diagnoses: Symptomatic leiomyomatous uteri/menopausal  Discharge Diagnoses: Symptomatic leiomyomatous uteri/menopausal  Active Problems:  * No active hospital problems. *    Discharged Condition: good  Hospital Course: Patient was admitted on September 11 where she underwent a total abdominal hysterectomy as a result of a symptomatic leiomyomatous uteri. (Pathology report pending). Patient did well postop. She had a Foley catheter for the first 24 hours with adequate urine output and was discontinued. She had a PCA pump with fentanyl and had to be discontinued because of pruritus and was switched to Dilaudid PCA pump. When this was discontinued and she was placed on by mouth narcotics she also had pruritus to Dilaudid by mouth and and was eventually switched over to Demerol 50 mg q. 4-6 hours when necessary which she tolerated along with Motrin 800 mg 3 times a day. Her diet was gradually increased from clear liquid to full regular diet this point she was up and ambulating tolerating regular diet and had passed flatus and had taken bath. She remained afebrile with stable vital signs during her postoperative course.  Consults: None  Significant Diagnostic Studies: labs: Her preop hemoglobin was 13.3 and postoperative hemoglobin was 12.2  Treatments: surgery: Total laparoscopic hysterectomy  Discharge Exam: Blood pressure 107/63, pulse 80, temperature 98.4 F (36.9 C), temperature source Oral, resp. rate 18, SpO2 97.00%. General appearance: alert Lungs clear to auscultation Rogers or wheezes Heart: Regular rate and rhythm no murmurs or gallops Abdomen: Soft nontender no rebound or guarding incision site intact positive bowel sounds all 4 quadrants Externally skin: No cords no edema No vaginal bleeding  reported  Disposition: Patient rated be discharged home this morning and will be followed next week to have her staples removed. She will resume her Estrace 2 mg by mouth daily for hormone replacement. She'll take Motrin 800 mg 3 times a day when necessary and Demerol 50 mg q. 4-6 hours when necessary. Prescription for Reglan 10 mg to take one by mouth every 4-6 hours when necessary nausea vomiting was provided as well.  Discharge Orders    Future Appointments: Provider: Department: Dept Phone: Center:   06/06/2012 12:20 PM [facility] [phone] [facility]       Medication List     As of 06/03/2012  7:01 AM    STOP taking these medications         aspirin 325 MG tablet      cyclobenzaprine 10 MG tablet   Commonly known as: FLEXERIL      progesterone 100 MG capsule   Commonly known as: PROMETRIUM      TAKE these medications         CA & PHOS-VIT D-MAG PO   Take 3 tablets by mouth at bedtime.      estradiol 2 MG tablet   Commonly known as: ESTRACE   Take 1 tablet (2 mg total) by mouth daily.      GLUCOSAMINE HCL PO   Take 1 tablet by mouth.      ibuprofen 800 MG tablet   Commonly known as: ADVIL,MOTRIN   Take 1 tablet (800 mg total) by mouth every 8 (eight) hours.      meperidine 50 MG tablet   Commonly known as: DEMEROL   Take 1 tablet (50 mg total) by mouth every 4 (four) hours as needed for pain.  metoCLOPramide 10 MG tablet   Commonly known as: REGLAN   May take 1 tablet every 6-8 hours when necessary nausea vomiting      Milk Thistle 200 MG Caps   Take 200-400 mg by mouth daily.      PROBIOTIC DAILY PO   Take 1 capsule by mouth.      terbinafine 250 MG tablet   Commonly known as: LAMISIL   Take 250 mg by mouth daily.         SignedOk Edwards 06/03/2012, 7:01 AM

## 2012-06-06 ENCOUNTER — Telehealth: Payer: Self-pay | Admitting: *Deleted

## 2012-06-06 ENCOUNTER — Ambulatory Visit: Payer: Commercial Managed Care - PPO

## 2012-06-06 MED ORDER — MEPERIDINE HCL 50 MG PO TABS: 50.0000 mg | ORAL_TABLET | Freq: Four times a day (QID) | ORAL | Status: AC | PRN

## 2012-06-06 NOTE — Telephone Encounter (Signed)
Please call in Demerol 50 mg to take 1 by mouth every 6 hours when necessary #30 (this is a control substance so I do not know if we need to send a written prescription). She can come in tomorrow so we can look at her staples and remove it and see the area that she has concerns and answer her question about the "cream".

## 2012-06-06 NOTE — Telephone Encounter (Signed)
rx printed and left for pick up. Pt informed with the below.

## 2012-06-06 NOTE — Telephone Encounter (Signed)
Pt calling requesting refill on demerol 50 mg, pt had hysterectomy on 06/01/12, still having a little pain from surgery. Pt said she notice on 3 of her staples on right side her skin was red under staples. Pt placed some antibody cream there and asked if this was okay? Pt has post op visit on 06/08/12. Please advise the above.

## 2012-06-07 ENCOUNTER — Ambulatory Visit (INDEPENDENT_AMBULATORY_CARE_PROVIDER_SITE_OTHER): Payer: Commercial Managed Care - PPO | Admitting: Gynecology

## 2012-06-07 ENCOUNTER — Encounter: Payer: Self-pay | Admitting: Gynecology

## 2012-06-07 VITALS — BP 128/78

## 2012-06-07 DIAGNOSIS — G8918 Other acute postprocedural pain: Secondary | ICD-10-CM

## 2012-06-07 NOTE — Progress Notes (Signed)
Patient presented to the office today to remove her staples from her Pfannenstiel incision. Patient status post total abdominal hysterectomy with ovarian conservation as a result of a 16 week size multilobulated uterus on 06/01/2012. Patient is doing well otherwise was concerned about some slight irritation when she does some lifting a home.  Exam: Abdomen soft nontender no rebound or guarding Staples were present and were removed and the incision was Steri-Stripped. There was some slight ecchymosis but otherwise nontender or indurated areas.  Pathology report discussed with the patient results as follows: Diagnosis Uterus and cervix - LEIOMYOMATA. - CERVIX: BENIGN SQUAMOUS MUCOSA AND ENDOCERVICAL MUCOSA, NO DYSPLASIA OR MALIGNANCY. - ENDOMETRIUM: BENIGN WEAKLY PROLIFERATIVE ENDOMETRIUM, NO HYPERPLASIA, ATYPIA OR MALIGNANCY.  Patient will return back to the office in 2 weeks for first full postop appointment. She was reminded to do any lifting or strenuous activity for 6 weeks. She may remove Steri-Strips in 3-4 days. She continues McDermott cream to the incision site to prevent scarring 2-3 times a day for a couple months.

## 2012-06-08 ENCOUNTER — Ambulatory Visit: Payer: Commercial Managed Care - PPO | Admitting: Gynecology

## 2012-06-09 ENCOUNTER — Telehealth: Payer: Self-pay | Admitting: *Deleted

## 2012-06-09 NOTE — Telephone Encounter (Signed)
Pt informed with the below note. 

## 2012-06-09 NOTE — Telephone Encounter (Signed)
Pt is post op total abdominal hysterectomy seen on 06/07/12 to have staples removed. Pt said she noticed two lumps where her staples were removed. Under her skin once she pressed down on skin. Pt asked if this is normal? She has a bowl movement yesterday after she felt this lump. Feels a little sore in this area. Please advise

## 2012-06-09 NOTE — Telephone Encounter (Signed)
This is all right it is probably irritation from the staple. As long as there is no drainage or an enlarging red area she will be fine.

## 2012-06-21 ENCOUNTER — Encounter: Payer: Self-pay | Admitting: Gynecology

## 2012-06-21 ENCOUNTER — Ambulatory Visit (INDEPENDENT_AMBULATORY_CARE_PROVIDER_SITE_OTHER): Payer: Commercial Managed Care - PPO | Admitting: Gynecology

## 2012-06-21 VITALS — BP 128/82

## 2012-06-21 DIAGNOSIS — N898 Other specified noninflammatory disorders of vagina: Secondary | ICD-10-CM

## 2012-06-21 DIAGNOSIS — Z9889 Other specified postprocedural states: Secondary | ICD-10-CM

## 2012-06-21 LAB — WET PREP FOR TRICH, YEAST, CLUE

## 2012-06-21 MED ORDER — CLINDAMYCIN PHOSPHATE 2 % VA CREA: 1.0000 | TOPICAL_CREAM | Freq: Every day | VAGINAL | Status: DC

## 2012-06-21 NOTE — Patient Instructions (Signed)
Bacterial Vaginosis Bacterial vaginosis (BV) is a vaginal infection where the normal balance of bacteria in the vagina is disrupted. The normal balance is then replaced by an overgrowth of certain bacteria. There are several different kinds of bacteria that can cause BV. BV is the most common vaginal infection in women of childbearing age. CAUSES   The cause of BV is not fully understood. BV develops when there is an increase or imbalance of harmful bacteria.  Some activities or behaviors can upset the normal balance of bacteria in the vagina and put women at increased risk including:  Having a new sex partner or multiple sex partners.  Douching.  Using an intrauterine device (IUD) for contraception.  It is not clear what role sexual activity plays in the development of BV. However, women that have never had sexual intercourse are rarely infected with BV. Women do not get BV from toilet seats, bedding, swimming pools or from touching objects around them.  SYMPTOMS   Grey vaginal discharge.  A fish-like odor with discharge, especially after sexual intercourse.  Itching or burning of the vagina and vulva.  Burning or pain with urination.  Some women have no signs or symptoms at all. DIAGNOSIS  Your caregiver must examine the vagina for signs of BV. Your caregiver will perform lab tests and look at the sample of vaginal fluid through a microscope. They will look for bacteria and abnormal cells (clue cells), a pH test higher than 4.5, and a positive amine test all associated with BV.  RISKS AND COMPLICATIONS   Pelvic inflammatory disease (PID).  Infections following gynecology surgery.  Developing HIV.  Developing herpes virus. TREATMENT  Sometimes BV will clear up without treatment. However, all women with symptoms of BV should be treated to avoid complications, especially if gynecology surgery is planned. Female partners generally do not need to be treated. However, BV may spread  between female sex partners so treatment is helpful in preventing a recurrence of BV.   BV may be treated with antibiotics. The antibiotics come in either pill or vaginal cream forms. Either can be used with nonpregnant or pregnant women, but the recommended dosages differ. These antibiotics are not harmful to the baby.  BV can recur after treatment. If this happens, a second round of antibiotics will often be prescribed.  Treatment is important for pregnant women. If not treated, BV can cause a premature delivery, especially for a pregnant woman who had a premature birth in the past. All pregnant women who have symptoms of BV should be checked and treated.  For chronic reoccurrence of BV, treatment with a type of prescribed gel vaginally twice a week is helpful. HOME CARE INSTRUCTIONS   Finish all medication as directed by your caregiver.  Do not have sex until treatment is completed.  Tell your sexual partner that you have a vaginal infection. They should see their caregiver and be treated if they have problems, such as a mild rash or itching.  Practice safe sex. Use condoms. Only have 1 sex partner. PREVENTION  Basic prevention steps can help reduce the risk of upsetting the natural balance of bacteria in the vagina and developing BV:  Do not have sexual intercourse (be abstinent).  Do not douche.  Use all of the medicine prescribed for treatment of BV, even if the signs and symptoms go away.  Tell your sex partner if you have BV. That way, they can be treated, if needed, to prevent reoccurrence. SEEK MEDICAL CARE IF:     Your symptoms are not improving after 3 days of treatment.  You have increased discharge, pain, or fever. MAKE SURE YOU:   Understand these instructions.  Will watch your condition.  Will get help right away if you are not doing well or get worse. FOR MORE INFORMATION  Division of STD Prevention (DSTDP), Centers for Disease Control and Prevention:  www.cdc.gov/std American [employer] Association (ASHA): www.ashastd.org  Document Released: 09/07/2005 Document Revised: 11/30/2011 Document Reviewed: 02/28/2009 ExitCare Patient Information 2013 [employer].  

## 2012-06-21 NOTE — Progress Notes (Signed)
Patient presented to the office for 2 weeks postop visit. She is status post total abdominal hysterectomy with ovarian conservation as a result of a 16 week size multilobulated uterus on 06/01/2012. Patient is doing well with no complaints. Pathology report was once again discussed as follows:  Pathology report discussed with the patient results as follows:  Diagnosis  Uterus and cervix  - LEIOMYOMATA.  - CERVIX: BENIGN SQUAMOUS MUCOSA AND ENDOCERVICAL MUCOSA, NO DYSPLASIA OR  MALIGNANCY.  - ENDOMETRIUM: BENIGN WEAKLY PROLIFERATIVE ENDOMETRIUM, NO HYPERPLASIA, ATYPIA  OR MALIGNANCY.  Exam: Abdomen: Pfannenstiel incision intact Pelvic: Bartholin urethra Skene was within normal limits Vagina: Vaginal cuff intact slight creamy discharge was noted but no blood. Bimanual exam: No palpable masses or tenderness Rectal exam: Not done  Wet prep no evidence of clue cells no bacteria was present  Assessment/plan: Patient 2 weeks status post total abdominal hysterectomy for symptomatic leiomyomatous uteri. Benign pathology report. Patient recovering well. Patient will be given a prescription Cleocin vaginal cream to apply each bedtime for 5 nights. Patient will return back to the office in 4 weeks for final postop visit. Patient will continue with her Estrace 2 mg by mouth daily for vasomotor symptoms.

## 2012-06-24 ENCOUNTER — Telehealth: Payer: Self-pay

## 2012-06-24 DIAGNOSIS — D3500 Benign neoplasm of unspecified adrenal gland: Secondary | ICD-10-CM

## 2012-06-24 NOTE — Telephone Encounter (Signed)
Dr Patsy Lager,   Patient has recovered from her surgery.  Would like to follow up with the letter sent to her.  Please call  [phone]

## 2012-06-24 NOTE — Telephone Encounter (Signed)
Dr Patsy Lager, pt would like to talk w/you about the adrenal nodule you wanted her to f/up on. Please advise.

## 2012-06-27 NOTE — Telephone Encounter (Signed)
Called her back- I will need to investigate what labs she will need, and will call her to make a plan within the next day or so

## 2012-06-30 NOTE — Telephone Encounter (Signed)
Called her- I have discussed with endocrine and we will order a BMP, 24 hour urine cortisol, plasma metanephrines and DHEA- sulfate.  Radiology looked at her scan again and did not feel that she needed to have a repeat scan- the contrast wash- out indicates a benign process   Kary Kos, MD Thu Jun 30, 2012 1:02:06 PM EDT       **ADDENDUM** CREATED: 06/30/2012 12:51:21  Upon further review, the delayed contrast imaging provides  sufficient data to analyze the adrenal gland and characterized as a  benign adenoma. The contrast enhanced Hounsfield units equal 68  and the delayed renogram phase imaging Hounsfield units = 27 which  calculates to a relative washout of 60% (greater than 40% relative  washout is consistent with benign adenoma). Findings discussed  with referring physician on 05/11/2012.   We will need Crystal Williamson to pick up a jug to do her urine collection- gave her a map to [facility] locations that have this item and an Rx for the jug.

## 2012-07-14 ENCOUNTER — Ambulatory Visit: Payer: Commercial Managed Care - PPO | Admitting: Obstetrics and Gynecology

## 2012-07-15 ENCOUNTER — Other Ambulatory Visit: Payer: Self-pay | Admitting: Gynecology

## 2012-07-15 ENCOUNTER — Ambulatory Visit
Admission: RE | Admit: 2012-07-15 | Discharge: 2012-07-15 | Disposition: A | Discharge status: Home / Self Care | Payer: Commercial Managed Care - PPO | Source: Ambulatory Visit | Attending: Obstetrics and Gynecology | Admitting: Obstetrics and Gynecology

## 2012-07-15 ENCOUNTER — Telehealth: Payer: Self-pay | Admitting: *Deleted

## 2012-07-15 DIAGNOSIS — Z1231 Encounter for screening mammogram for malignant neoplasm of breast: Secondary | ICD-10-CM

## 2012-07-15 NOTE — Telephone Encounter (Signed)
Patient also stated during the conversation that she also has not had her blood tests completed for the same reason.

## 2012-07-15 NOTE — Telephone Encounter (Signed)
Called patient to ask if she had completed the 24 hour urine collection.  She stated that she hasn't done so yet because she is having a financial hardship.  She stated she would try to come pick up the materials today or next Friday.

## 2012-07-19 ENCOUNTER — Encounter: Payer: Self-pay | Admitting: Gynecology

## 2012-07-19 ENCOUNTER — Ambulatory Visit (INDEPENDENT_AMBULATORY_CARE_PROVIDER_SITE_OTHER): Payer: Commercial Managed Care - PPO | Admitting: Gynecology

## 2012-07-19 VITALS — BP 130/80

## 2012-07-19 DIAGNOSIS — Z9889 Other specified postprocedural states: Secondary | ICD-10-CM

## 2012-07-19 DIAGNOSIS — L659 Nonscarring hair loss, unspecified: Secondary | ICD-10-CM

## 2012-07-19 NOTE — Patient Instructions (Signed)
Alopecia Areata Alopecia areata is a self-destructing (autoimmune) disease that results in the loss of hair. In this condition your body's immune system attacks the hair follicle. The hair follicle is responsible for growing hair. Hair loss can occur on the scalp and other parts of the body. It usually starts as one or more small, round, smooth patches of hair loss. It occurs in males and females of all ages and races, but usually starts before age 45. The scalp is the most commonly affected area, but the beard or any hair-bearing site can be affected. This type of hair loss does not leave scars where the hair was lost.  Many people with alopecia areata only have a few patches of hair loss. In others, extensive patchy hair loss occurs. In a few people, all scalp hair is lost (alopecia totalis), or hair is lost from the entire scalp and body (alopecia universalis). No matter how widespread the hair loss, the hair follicles remain alive and are ready to resume normal hair production whenever they receive the correct signal. Hair re-growth may occur without treatment and can even restart after years of hair loss.  CAUSES  It is thought that something triggers the immune system to stop hair growth. It is not always known what the cause is. Some people have genetic markers that can increase the chance of developing alopecia areata. Alopecia areata often occurs in families whose members have had:  Asthma.  Hay fever.  Atopic eczema.  Some autoimmune diseases may also be a trigger, such as:  Thyroid disease.  Diabetes.  Rheumatoid arthritis.  Lupus erythematosus.  Vitiligo.  Pernicious anemia.  Addison's disease. OTHER SYMPTOMS In some people, the nail beds may develop rows of tiny dents (stippling) or the nail beds can become distorted. Other than the hair and nail beds, no other body part is affected.  PROGNOSIS  Alopecia areata is not medically disabling. People with alopecia areata are  usually in excellent health. Hair loss can be emotionally difficult. The National Alopecia Areata Foundation has resources available to help individuals and families with alopecia areata. Their goal is to help people with the condition live full, productive lives. There are many successful, well-adjusted, contented people living with Alopecia areata. Alopecia areata can be overcome with:  A positive self image.  Sound medical facts.  The support of others, such as:  Sometimes professional counseling is helpful to develop one's self-confidence and positive self-image. TREATMENT  There is no cure for alopecia areata. There are several available treatments. Treatments are most effective in milder cases. No treatment is effective for everyone. Choice of treatment depends mainly on a person's age and the extent of their hair loss. Alopecia areata occurs in two forms:   A mild patchy form where less than 50 percent of scalp hair is lost.  An extensive form where greater than 50 percent of scalp hair is lost. These two forms of alopecia areata behave quite differently, and the choice of treatment depends on which form is present. Current treatments do not turn alopecia areata off. They can stimulate the hair follicle to produce hair.  Some medications used to treat mild cases include:  Cortisone injections. The most common treatment is the injection of cortisone into the bare skin patches. The injections are usually given by a caregiver specializing in skin issues (dermatologist). This caregiver will use a tiny needle to give multiple injections into the skin in and around the bare patches. The injections are repeated once a month.   If new hair growth occurs, it is usually visible within 4 weeks. Treatment does not prevent new patches of hair loss from developing. There are few side effects from local cortisone injections. Occasionally, temporary dents (depressions) in the skin result from the local  injections, but these dents can fill in by themselves.  Topical minoxidil. Five percent topical minoxidil solution applied twice daily may grow hair in alopecia areata. Scalp, eyebrows, and beard hair may respond. If scalp hair re-grows completely, treatment can be stopped. Response may improve if topical cortisone cream is applied 30 minutes after the minoxidil. Topical minoxidil is safe, easy to use, and does not lower blood pressure in persons with normal blood pressure. Minoxidil can lead to unwanted facial hair growth in some people.  Anthralin cream or ointment. Another treatment is the application of anthralin cream or ointment. Anthralin is a tar-like substance that has been used widely for psoriasis. Anthralin is applied to the bare patches once daily. It is washed off after a short time, usually 30 to 60 minutes later. If new hair growth occurs, it is seen in 8 to 12 weeks. Anthralin can be irritating to the skin. It can cause temporary, brownish discoloration of the treated skin. By using short treatment times, skin irritation and skin staining are reduced without decreasing effectiveness. Care must be taken not to get anthralin in the eyes. Some of the medications used for more extensive cases where there is greater than 50% hair loss include:  Cortisone pills. Cortisone pills are sometimes given for extensive scalp hair loss. Cortisone taken internally is much stronger than local injections of cortisone into the skin. It is necessary to discuss possible side effects of cortisone pills with your caregiver. In general, however, cortisone pills are used in relatively few patients with alopecia areata due to health risks from prolonged use. Also, hair that has grown is likely to fall out when the cortisone pills are stopped.  Topical minoxidil. See previous explanation under mild, patchy alopecia areata. However, minoxidil is not effective in total loss of scalp hair (alopecia totalis).  Topical  immunotherapy. Another method of treating alopecia areata or alopecia totalis/universalis involves producing an allergic rash or allergic contact dermatitis. Chemicals such as diphencyprone (DPCP) or squaric acid dibutyl ester (SADBE) are applied to the scalp to produce an allergic rash which resembles poison oak or ivy. Approximately 40% of patients treated with topical immunotherapy will re-grow scalp hair after about 6 months of treatment. Those who do successfully re-grow scalp hair will need to continue treatment to maintain hair re-growth.  Wigs. For extensive hair loss, a wig can be an important option for some people. Proper attention will make a quality wig look completely natural. A wig will need to be cut, thinned, and styled. To keep a net base wig from falling off, special double-sided tape can be purchased in beauty supply outlets and fastened to the inside of the wig.  For those with completely bare heads, there are suction caps to which any wig can be attached. There are also entire suction cap wig units. FOR MORE INFORMATION National Alopecia Areata Foundation: www.naaf.org Document Released: 04/11/2004 Document Revised: 11/30/2011 Document Reviewed: 11/13/2008 Exit[employer] Patient Information 2013 [employer].  

## 2012-07-19 NOTE — Progress Notes (Signed)
Patient presented to the office today for 6 weeks postop visit. She is status post total abdominal hysterectomy with ovarian conservation as a result of a 16 week size multilobulated uterus on 06/01/2012. Patient is doing well with no complaints. Pathology report was once again discussed as follows:   Pathology report discussed with the patient results as follows:  Diagnosis  Uterus and cervix  - LEIOMYOMATA.  - CERVIX: BENIGN SQUAMOUS MUCOSA AND ENDOCERVICAL MUCOSA, NO DYSPLASIA OR  MALIGNANCY.  - ENDOMETRIUM: BENIGN WEAKLY PROLIFERATIVE ENDOMETRIUM, NO HYPERPLASIA, ATYPIA  OR MALIGNANCY.  Patient is doing well she is asymptomatic. She is currently on Estrace 2 mg one by mouth daily for vasomotor symptoms. Preoperatively she had been tested and found to have an elevated FSH. We discussed the women's health initiative study and recommend starting to taper her down the next few years to her lower dose and eventually take her off of it completely.  Exam: Abdomen: Pfannenstiel incision intact soft nontender no rebound or guarding Pelvic: Bartholin urethra Skene was within normal limits Vagina: No lesions or discharge vaginal cuff intact Bimanual exam no palpable masses or tenderness Adnexa: No palpable masses or tenderness Rectal: Not examined  Assessment/plan: 6 weeks postop status post total abdominal hysterectomy for symptomatic leiomyomatous uteri. Patient was informed normal activity. She was complaining of from her surgery her hair started to thin out some and losing some hair. We'll check her TSH. She has a followup appointment with her dermatologist. Patient mammogram this year was normal. She was encouraged to do monthly self breast examination and otherwise we will see her back in one year or when necessary. Patient declined flu vaccine today.

## 2012-07-20 LAB — TSH: TSH: 1.824 u[IU]/mL (ref 0.350–4.500)

## 2012-08-08 ENCOUNTER — Other Ambulatory Visit: Payer: Self-pay | Admitting: Family Medicine

## 2012-08-22 ENCOUNTER — Ambulatory Visit (INDEPENDENT_AMBULATORY_CARE_PROVIDER_SITE_OTHER): Payer: Commercial Managed Care - PPO | Admitting: Family Medicine

## 2012-08-22 ENCOUNTER — Encounter: Payer: Self-pay | Admitting: Family Medicine

## 2012-08-22 VITALS — BP 154/94 | HR 98 | Temp 98.6°F | Resp 16 | Ht 68.5 in | Wt 236.0 lb

## 2012-08-22 DIAGNOSIS — R52 Pain, unspecified: Secondary | ICD-10-CM

## 2012-08-22 DIAGNOSIS — B349 Viral infection, unspecified: Secondary | ICD-10-CM

## 2012-08-22 DIAGNOSIS — B9789 Other viral agents as the cause of diseases classified elsewhere: Secondary | ICD-10-CM

## 2012-08-22 DIAGNOSIS — M549 Dorsalgia, unspecified: Secondary | ICD-10-CM

## 2012-08-22 LAB — POCT INFLUENZA A/B
Influenza A, POC: NEGATIVE
Influenza B, POC: NEGATIVE

## 2012-08-22 MED ORDER — TRAMADOL HCL 50 MG PO TABS: 50.0000 mg | ORAL_TABLET | Freq: Three times a day (TID) | ORAL | Status: AC | PRN

## 2012-08-22 MED ORDER — BENZONATATE 100 MG PO CAPS: ORAL_CAPSULE | ORAL | Status: DC

## 2012-08-22 MED ORDER — FLUTICASONE PROPIONATE 50 MCG/ACT NA SUSP: NASAL | Status: DC

## 2012-08-22 NOTE — Patient Instructions (Addendum)
Drink lots of fluids and get plenty of rest.  Take the Tessalon as needed for cough  Use the fluticasone spray 2 sprays each nostril twice for 3 days, then once daily  Take Claritin-D one daily  Take Tylenol or ibuprofen for fever  Return if not improving over the next 3 or 4 days, sooner if worse.  Tramadol for pain

## 2012-08-22 NOTE — Progress Notes (Signed)
Subjective: During the night the patient got ill with nasal congestion, first one side than the other, then it moved down into her chest. She had a fever, sore throat, terrible body aches, and coughing. She just feels miserable. She has not had a flu shot this year.  Objective: Ill-appearing lady but doubled up. TMs normal. Throat mildly erythematous. Nose is congested. Chest clear to auscultation. Heart regular without murmurs. She is coughing. Neck was supple without nodes.  Assessment: Viral syndrome Body aches  Results for orders placed in visit on 08/22/12  POCT INFLUENZA A/B      Component Value Range   Influenza A, POC Negative     Influenza B, POC Negative      Plan: Strep screen. This was done but was negative. Will treat symptomatically.

## 2012-10-03 ENCOUNTER — Other Ambulatory Visit: Payer: Self-pay | Admitting: Physician Assistant

## 2012-10-03 NOTE — Telephone Encounter (Signed)
Needs office visit.

## 2012-11-16 ENCOUNTER — Telehealth: Payer: Self-pay

## 2012-11-16 DIAGNOSIS — D3500 Benign neoplasm of unspecified adrenal gland: Secondary | ICD-10-CM

## 2012-11-16 NOTE — Telephone Encounter (Signed)
PT WOULD LIKE TO MAKE SURE THE ORDER DR COPLAND HAD PUT IN FOR HER WAS STILL GOOD, SHE WASN'T ABLE TO DO THEN, BUT NOW SHE IS. ALSO WHEN SHE COMES IN TO GIVE BLOOD CAN WE ALSO INCLUDE TO HAVE HER HORMONES CHECKED ALSO. WOULD LIKE DR COPLAND TO CALL [phone] BECAUSE SHE WOULD PREFER TO EXPLAIN MORE TO HER  PLEASE CALL [phone]

## 2012-11-16 NOTE — Telephone Encounter (Signed)
What hormones does she want checked? She states she wants her estrogen levels checked she had hysterectomy in Sept and would like to have this done?

## 2012-11-17 ENCOUNTER — Other Ambulatory Visit (INDEPENDENT_AMBULATORY_CARE_PROVIDER_SITE_OTHER): Payer: Commercial Managed Care - PPO | Admitting: *Deleted

## 2012-11-17 DIAGNOSIS — D3500 Benign neoplasm of unspecified adrenal gland: Secondary | ICD-10-CM

## 2012-11-17 LAB — BASIC METABOLIC PANEL
BUN: 17 mg/dL (ref 6–23)
CO2: 24 mEq/L (ref 19–32)
Calcium: 8.9 mg/dL (ref 8.4–10.5)
Creat: 0.58 mg/dL (ref 0.50–1.10)
Glucose, Bld: 117 mg/dL — ABNORMAL HIGH (ref 70–99)

## 2012-11-17 NOTE — Telephone Encounter (Signed)
Called lab- her old orders for BMP, DHEA sulfate and plasma metanephrines were canceled so I will need to reorder these.  Her 24 hour urine cortisol order is still active for her.    She also would like to have an estrogen level drawn- she is taking PO estradiol per her OB and is having some headaches.  Advised her that I am happy to order this level, but am not sure how useful it will be as she is on exogenous estrogen.  She will call her OB Dr. Lily Peer instead and ask his advice.

## 2012-11-18 LAB — DHEA-SULFATE: DHEA-SO4: 256 ug/dL (ref 35–430)

## 2012-11-21 LAB — METANEPHRINES, PLASMA: Total Metanephrines-Plasma: 70 pg/mL (ref <=205)

## 2012-11-24 ENCOUNTER — Encounter: Payer: Self-pay | Admitting: Family Medicine

## 2012-12-26 ENCOUNTER — Other Ambulatory Visit: Payer: Self-pay | Admitting: Physician Assistant

## 2013-01-16 ENCOUNTER — Telehealth: Payer: Self-pay | Admitting: Family Medicine

## 2013-01-16 DIAGNOSIS — D3500 Benign neoplasm of unspecified adrenal gland: Secondary | ICD-10-CM

## 2013-01-16 NOTE — Telephone Encounter (Signed)
Called her- her cortisol levels are upper limit/ slightly above normal.  She would like to see endocrine to ensure nothing else needs to be done.  I will arrange this for her

## 2013-01-16 NOTE — Telephone Encounter (Signed)
Message copied by Pearline Cables on Mon Jan 16, 2013  5:35 PM ------      Message from: JEFFERY, CHELLE S      Created: Wed Jan 04, 2013 10:21 AM      Regarding: cortisol level       Came to me by mistake?                  ----- Message -----         From: [facility] Interface         Sent: 12/31/2012   7:23 PM           To: Fernande Bras, PA-C                   ------

## 2013-01-20 ENCOUNTER — Telehealth: Payer: Self-pay

## 2013-01-20 ENCOUNTER — Ambulatory Visit (INDEPENDENT_AMBULATORY_CARE_PROVIDER_SITE_OTHER): Payer: Commercial Managed Care - PPO | Admitting: Endocrinology

## 2013-01-20 ENCOUNTER — Encounter: Payer: Self-pay | Admitting: Endocrinology

## 2013-01-20 VITALS — BP 126/80 | HR 78 | Wt 233.0 lb

## 2013-01-20 DIAGNOSIS — E278 Other specified disorders of adrenal gland: Secondary | ICD-10-CM

## 2013-01-20 DIAGNOSIS — D3500 Benign neoplasm of unspecified adrenal gland: Secondary | ICD-10-CM

## 2013-01-20 MED ORDER — DEXAMETHASONE 1 MG PO TABS: 1.0000 mg | ORAL_TABLET | Freq: Two times a day (BID) | ORAL | Status: DC

## 2013-01-20 NOTE — Telephone Encounter (Signed)
[facility] calling to verify quantity on rx sent in for dexamethasone.  It was for only # 1 pill.  Is this correct?

## 2013-01-20 NOTE — Patient Instructions (Addendum)
Let's recheck the ct scan.  you will receive a phone call, about a day and time for an appointment. you should do a "dexamethasone suppression test."  for this, you would take dexamethasone 1 mg at 10 pm, then come in for a "cortisol" blood test the next morning before 9 am, at the [facility] office across the street from [facility].  you do not need to be fasting for this test.   I would be happy to see you back here whenever you want.

## 2013-01-20 NOTE — Progress Notes (Signed)
Subjective:    Patient ID: Crystal Williamson, female    DOB: [birth year], 47 y.o.   MRN: [id number]  HPI 6 mos ago, pt had ct scan for moderate pain at the left lower back.  An adrenal adenoma was incidentally noted.  The pain is much better now.  She has assoc hair loss on her head.   Past Medical History  Diagnosis Date  . Fibroid   . Headache   . No pertinent past medical history     Past Surgical History  Procedure Laterality Date  . Septoplasty    . Wisdom tooth extraction    . Abdominal hysterectomy  06/01/2012    Procedure: HYSTERECTOMY ABDOMINAL;  Surgeon: Ok Edwards, MD;  Location: [facility];  Service: Gynecology;  Laterality: N/A;    History   Social History  . Marital Status: Married    Spouse Name: N/A    Number of Children: N/A  . Years of Education: N/A   Occupational History  . Not on file.   Social History Main Topics  . Smoking status: Current Every Day Smoker -- 0.50 packs/day    Types: Cigarettes  . Smokeless tobacco: Not on file  . Alcohol Use: 10.5 oz/week  . Drug Use: No  . Sexually Active: Yes    Birth Control/ Protection: None   Other Topics Concern  . Not on file   Social History Narrative   ** Merged History Encounter **        Current Outpatient Prescriptions on File Prior to Visit  Medication Sig Dispense Refill  . estradiol (ESTRACE) 2 MG tablet Take 1 tablet (2 mg total) by mouth daily.  30 tablet  11  . Milk Thistle 200 MG CAPS Take 200-400 mg by mouth daily.      . Probiotic Product (PROBIOTIC DAILY PO) Take 1 capsule by mouth.      . benzonatate (TESSALON) 100 MG capsule Use 1-2 tablets 3 times daily as necessary for cough. May be used with other cough medicines if needed.  30 capsule  0  . CA & PHOS-VIT D-MAG PO Take 3 tablets by mouth at bedtime.      . clindamycin (CLEOCIN) 2 % vaginal cream Place 1 Applicatorful vaginally at bedtime.  40 g  0  . cyclobenzaprine (FLEXERIL) 10 MG tablet TAKE ONE TABLET BY MOUTH THREE  TIMES DAILY AS NEEDED  30 tablet  0  . fluticasone (FLONASE) 50 MCG/ACT nasal spray Use 2 sprays twice daily for 3 days, then once daily  16 g  6  . GLUCOSAMINE HCL PO Take 1 tablet by mouth.      . metoCLOPramide (REGLAN) 10 MG tablet May take 1 tablet every 6-8 hours when necessary nausea vomiting  30 tablet  1  . terbinafine (LAMISIL) 250 MG tablet Take 250 mg by mouth daily.       No current facility-administered medications on file prior to visit.    Allergies  Allergen Reactions  . Codeine Itching    REACTION: N \\T \ V  . Hydromorphone Itching  . Percocet (Oxycodone-Acetaminophen) Itching    Family History  Problem Relation Age of Onset  . Colon cancer Paternal Grandfather   . Diabetes Paternal Grandfather     type 2   . Cancer Paternal Grandfather     COLON  . Breast cancer Paternal Grandmother   . Stroke Maternal Grandmother   . Lupus Mother   . Lumbar disc disease Mother   no thyroid dz  BP 126/80  Pulse 78  Wt 233 lb (105.688 kg)  BMI 34.91 kg/m2  SpO2 96%  LMP 02/08/2012   Review of Systems denies pallor, n/v, syncope, diarrhea, weight loss, chest pain, sob, visual loss, palpitations, fever, arthralgias, rhinorrhea, and excessive diaphoresis.  She has insomnia facial hair growth, difficulty with concentration, headache, daily facial flushing, easy bruising, anxiety, and dry skin.     Objective:   Physical Exam VS: see vs page GEN: no distress HEAD: head: no deformity eyes: no periorbital swelling, no proptosis external nose and ears are normal mouth: no lesion seen NECK: supple, thyroid is not enlarged CHEST WALL: no deformity LUNGS:  Clear to auscultation CV: reg rate and rhythm, no murmur ABD: abdomen is soft, nontender.  no hepatosplenomegaly.  not distended.  no hernia.   MUSCULOSKELETAL: muscle bulk and strength are grossly normal.  no obvious joint swelling.  gait is normal and steady EXTEMITIES: no deformity.  no ulcer on the feet.  feet are of  normal color and temp.  no edema PULSES: dorsalis pedis intact bilat.  no carotid bruit NEURO:  cn 2-12 grossly intact.   readily moves all 4's.  sensation is intact to touch on the feet SKIN:  Normal texture and temperature.  No rash or suspicious lesion is visible.  No cafe-au-lait spots.  Not diaphoretic. NODES:  None palpable at the neck PSYCH: alert, oriented x3.  Does not appear anxious nor depressed.   (i reviewed ct and lab results)    Assessment & Plan:  Small adrenal nodule, prob a benign adenoma Headache.  No pheo is found Hyperglycemia.  She should have dex test, as 24-hr urine cortisol is equivocal Hair loss, uncertain etiology

## 2013-01-22 NOTE — Telephone Encounter (Signed)
Yes, correct 

## 2013-01-23 ENCOUNTER — Telehealth: Payer: Self-pay | Admitting: Endocrinology

## 2013-01-23 NOTE — Telephone Encounter (Signed)
It should say "take the 1 pill at 10 pm, the night before blood test."

## 2013-01-23 NOTE — Telephone Encounter (Signed)
Spoke with pharmacist alfred, pt should only get # 1 tablet.

## 2013-01-23 NOTE — Telephone Encounter (Signed)
Patient states that when she picked up her Dexamethasone Rx the instructions said to take it twice a day. Patient states Dr. Everardo All asked her to only take it at night. She wants to know if she should take it with dinner or should she take it right before she goes to bed.

## 2013-01-23 NOTE — Telephone Encounter (Signed)
Pt advised.

## 2013-01-25 ENCOUNTER — Other Ambulatory Visit: Payer: Self-pay

## 2013-01-25 ENCOUNTER — Ambulatory Visit (INDEPENDENT_AMBULATORY_CARE_PROVIDER_SITE_OTHER)
Admission: RE | Admit: 2013-01-25 | Discharge: 2013-01-25 | Disposition: A | Discharge status: Home / Self Care | Payer: Commercial Managed Care - PPO | Source: Ambulatory Visit | Attending: Endocrinology | Admitting: Endocrinology

## 2013-01-25 ENCOUNTER — Other Ambulatory Visit (INDEPENDENT_AMBULATORY_CARE_PROVIDER_SITE_OTHER): Payer: Commercial Managed Care - PPO

## 2013-01-25 ENCOUNTER — Telehealth: Payer: Self-pay

## 2013-01-25 DIAGNOSIS — R5381 Other malaise: Secondary | ICD-10-CM

## 2013-01-25 DIAGNOSIS — E278 Other specified disorders of adrenal gland: Secondary | ICD-10-CM

## 2013-01-25 DIAGNOSIS — R5383 Other fatigue: Secondary | ICD-10-CM

## 2013-01-25 NOTE — Telephone Encounter (Signed)
The lump is tiny, and unchanged.  If you sign up for "my chart," you can see the actual reports.

## 2013-01-25 NOTE — Telephone Encounter (Signed)
Pt would like specific details of all test results, pt was advised test were ok,but she would like to know specifically if the tumor is gone, what does this mean for, please advise [phone].

## 2013-01-26 NOTE — Telephone Encounter (Signed)
LVOM pt to call if any questions 

## 2013-01-31 LAB — ALDOSTERONE + RENIN ACTIVITY W/ RATIO
ALDO / PRA Ratio: 3.9 Ratio (ref 0.9–28.9)
Aldosterone: 5 ng/dL

## 2013-04-27 ENCOUNTER — Ambulatory Visit (INDEPENDENT_AMBULATORY_CARE_PROVIDER_SITE_OTHER): Payer: Commercial Managed Care - PPO | Admitting: Family Medicine

## 2013-04-27 VITALS — BP 120/90 | HR 72 | Temp 97.9°F | Resp 16 | Ht 68.0 in | Wt 228.4 lb

## 2013-04-27 DIAGNOSIS — R21 Rash and other nonspecific skin eruption: Secondary | ICD-10-CM

## 2013-04-27 DIAGNOSIS — R350 Frequency of micturition: Secondary | ICD-10-CM

## 2013-04-27 DIAGNOSIS — N390 Urinary tract infection, site not specified: Secondary | ICD-10-CM

## 2013-04-27 DIAGNOSIS — B9689 Other specified bacterial agents as the cause of diseases classified elsewhere: Secondary | ICD-10-CM

## 2013-04-27 DIAGNOSIS — N760 Acute vaginitis: Secondary | ICD-10-CM

## 2013-04-27 DIAGNOSIS — N393 Stress incontinence (female) (male): Secondary | ICD-10-CM

## 2013-04-27 LAB — POCT URINALYSIS DIPSTICK
Bilirubin, UA: NEGATIVE
Glucose, UA: NEGATIVE
Leukocytes, UA: NEGATIVE
Nitrite, UA: NEGATIVE
Urobilinogen, UA: 0.2

## 2013-04-27 LAB — POCT UA - MICROSCOPIC ONLY
Mucus, UA: NEGATIVE
WBC, Ur, HPF, POC: NEGATIVE
Yeast, UA: NEGATIVE

## 2013-04-27 LAB — POCT WET PREP WITH KOH
Clue Cells Wet Prep HPF POC: POSITIVE
Yeast Wet Prep HPF POC: NEGATIVE

## 2013-04-27 MED ORDER — METRONIDAZOLE 500 MG PO TABS: 500.0000 mg | ORAL_TABLET | Freq: Two times a day (BID) | ORAL | Status: DC

## 2013-04-27 NOTE — Patient Instructions (Addendum)
For the patch next to your eye -call your dermatologist to have this area evaluated, but can try over the counter cortisone twice per day as needed.  Avoid getting this medicine in your eye. Try to avoid applying makeup over this area. Return to the clinic or go to the nearest emergency room if any of your symptoms worsen or new symptoms occur.  We will send a urine culture, but does not appear to be urinary infection.   Start flagyl for bacterial infection as below.  Start kegel exercises and rechek if not improving incontinence. Sooner if worse.   Tylenol or motrin as needed for back pain - recheck if this is not improving.   Bacterial Vaginosis Bacterial vaginosis (BV) is a vaginal infection where the normal balance of bacteria in the vagina is disrupted. The normal balance is then replaced by an overgrowth of certain bacteria. There are several different kinds of bacteria that can cause BV. BV is the most common vaginal infection in women of childbearing age. CAUSES   The cause of BV is not fully understood. BV develops when there is an increase or imbalance of harmful bacteria.  Some activities or behaviors can upset the normal balance of bacteria in the vagina and put women at increased risk including:  Having a new sex partner or multiple sex partners.  Douching.  Using an intrauterine device (IUD) for contraception.  It is not clear what role sexual activity plays in the development of BV. However, women that have never had sexual intercourse are rarely infected with BV. Women do not get BV from toilet seats, bedding, swimming pools or from touching objects around them.  SYMPTOMS   Grey vaginal discharge.  A fish-like odor with discharge, especially after sexual intercourse.  Itching or burning of the vagina and vulva.  Burning or pain with urination.  Some women have no signs or symptoms at all. DIAGNOSIS  Your caregiver must examine the vagina for signs of BV. Your  caregiver will perform lab tests and look at the sample of vaginal fluid through a microscope. They will look for bacteria and abnormal cells (clue cells), a pH test higher than 4.5, and a positive amine test all associated with BV.  RISKS AND COMPLICATIONS   Pelvic inflammatory disease (PID).  Infections following gynecology surgery.  Developing HIV.  Developing herpes virus. TREATMENT  Sometimes BV will clear up without treatment. However, all women with symptoms of BV should be treated to avoid complications, especially if gynecology surgery is planned. Female partners generally do not need to be treated. However, BV may spread between female sex partners so treatment is helpful in preventing a recurrence of BV.   BV may be treated with antibiotics. The antibiotics come in either pill or vaginal cream forms. Either can be used with nonpregnant or pregnant women, but the recommended dosages differ. These antibiotics are not harmful to the baby.  BV can recur after treatment. If this happens, a second round of antibiotics will often be prescribed.  Treatment is important for pregnant women. If not treated, BV can cause a premature delivery, especially for a pregnant woman who had a premature birth in the past. All pregnant women who have symptoms of BV should be checked and treated.  For chronic reoccurrence of BV, treatment with a type of prescribed gel vaginally twice a week is helpful. HOME CARE INSTRUCTIONS   Finish all medication as directed by your caregiver.  Do not have sex until treatment is completed.  Tell your sexual partner that you have a vaginal infection. They should see their caregiver and be treated if they have problems, such as a mild rash or itching.  Practice safe sex. Use condoms. Only have 1 sex partner. PREVENTION  Basic prevention steps can help reduce the risk of upsetting the natural balance of bacteria in the vagina and developing BV:  Do not have sexual  intercourse (be abstinent).  Do not douche.  Use all of the medicine prescribed for treatment of BV, even if the signs and symptoms go away.  Tell your sex partner if you have BV. That way, they can be treated, if needed, to prevent reoccurrence. SEEK MEDICAL CARE IF:   Your symptoms are not improving after 3 days of treatment.  You have increased discharge, pain, or fever. MAKE SURE YOU:   Understand these instructions.  Will watch your condition.  Will get help right away if you are not doing well or get worse. FOR MORE INFORMATION  Division of STD Prevention (DSTDP), Centers for Disease Control and Prevention: [employer] American Social Health Association (ASHA): www.ashastd.org  Document Released: 09/07/2005 Document Revised: 11/30/2011 Document Reviewed: 02/28/2009 [facility] Patient Information 2014 [place], [place].

## 2013-04-27 NOTE — Progress Notes (Signed)
  Subjective:    Patient ID: Crystal Williamson, female    DOB: Oct 24, 1965, 47 y.o.   MRN: [id number]  HPI    Review of Systems     Objective:   Physical Exam  Genitourinary: Pelvic exam was performed with patient supine. Vaginal discharge (thin, white) found.  S/p total hysterectomy          Assessment & Plan:

## 2013-04-27 NOTE — Progress Notes (Signed)
Subjective:    Patient ID: Crystal Williamson, female    DOB: [birth year], 47 y.o.   MRN: [id number]  HPI Crystal Williamson is a 47 y.o. female  Thinks has UTI. 1 week hx of frequency, dysuria, - slight discomfort in lower abdomen. Some stress incontinence, burning sx's. No fever, but doesn't; feel well. Has had some soreness in mid back - both sides, and to right. Felt worse since yesterday. Slight nausea past 2 days.   No hx of kidney stones.  Last UTI - 1 year ago.   S/p hysterectomy for fibroids 1 year ago.  Tx: none.    After initial hx/exam - additional concern  - R  Eye redness/patch - past month. Itches at times. Sore at times. Been there about about a month. No vision change. No attempted treatments.  Has dermatologist, but has not seen them for this. No hx of skin cancer.   Review of Systems  Constitutional: Negative for fever and chills.  Gastrointestinal: Positive for abdominal pain. Negative for nausea and vomiting.       Lower abdomen/bladder area  Genitourinary: Positive for dysuria, urgency, frequency and flank pain. Negative for hematuria, decreased urine volume, vaginal bleeding, vaginal discharge, difficulty urinating (few episodes incontinence as above. ), menstrual problem and pelvic pain.  Musculoskeletal: Negative for back pain.       Objective:   Physical Exam  Vitals reviewed. Constitutional: She is oriented to person, place, and time. She appears well-developed and well-nourished.  HENT:  Head: Normocephalic and atraumatic.  Eyes: Conjunctivae and lids are normal. Right eye exhibits no chemosis, no discharge, no exudate and no hordeolum. Left eye exhibits no chemosis, no discharge, no exudate and no hordeolum.    Pulmonary/Chest: Effort normal.  Abdominal: Soft. Normal appearance. She exhibits no distension. There is no tenderness. There is CVA tenderness (R sided paraspinal muscles. ). There is no rebound and no guarding.  Musculoskeletal:   Back:  Neurological: She is alert and oriented to person, place, and time.  Skin: Skin is warm.  Psychiatric: She has a normal mood and affect. Her behavior is normal.   Results for orders placed in visit on 04/27/13  POCT URINALYSIS DIPSTICK      Result Value Range   Color, UA yellow     Clarity, UA clear     Glucose, UA neg     Bilirubin, UA neg     Ketones, UA neg     Spec Grav, UA <=1.005     Blood, UA neg     pH, UA 6.5     Protein, UA neg     Urobilinogen, UA 0.2     Nitrite, UA neg     Leukocytes, UA Negative    POCT UA - MICROSCOPIC ONLY      Result Value Range   WBC, Ur, HPF, POC neg     RBC, urine, microscopic neg     Bacteria, U Microscopic Trace     Mucus, UA neg     Epithelial cells, urine per micros 2-5     Crystals, Ur, HPF, POC neg     Casts, Ur, LPF, POC neg     Yeast, UA neg    POCT WET PREP WITH KOH      Result Value Range   Trichomonas, UA Negative     Clue Cells Wet Prep HPF POC POSITIVE     Epithelial Wet Prep HPF POC 10-15     Yeast Wet Prep HPF POC NEG  Bacteria Wet Prep HPF POC MODERATE     RBC Wet Prep HPF POC NEG     WBC Wet Prep HPF POC 0-3     KOH Prep POC Negative         Assessment & Plan:  Crystal Williamson is a 47 y.o. female  Urinary frequency - may be due to BV (bacterial vaginosis) - start Flagyl 500mg  BID, avoid alcohol, will check urine cx, but doubt UTI.   Stress incontinence - s/p hysterectomy - kegel exercises/progressive kegel exercises for pelvic floor strengthening discussed. If persistent sx's - rtc to discuss or may need urology eval.   Rash of face - minimal erythema - 1 month of sx's.  Overall reassuring exam, ddx contacte derm vs nevus.  Trial of otc cortisone, and she will call her dermatologist for follow up. rtc precautions.   Meds ordered this encounter  Medications  . metroNIDAZOLE (FLAGYL) 500 MG tablet    Sig: Take 1 tablet (500 mg total) by mouth 2 (two) times daily.    Dispense:  14 tablet     Refill:  0   Patient Instructions  For the patch next to your eye -call your dermatologist to have this area evaluated, but can try over the counter cortisone twice per day as needed.  Avoid getting this medicine in your eye. Try to avoid applying makeup over this area. Return to the clinic or go to the nearest emergency room if any of your symptoms worsen or new symptoms occur.  We will send a urine culture, but does not appear to be urinary infection.   Start flagyl for bacterial infection as below.  Start kegel exercises and rechek if not improving incontinence. Sooner if worse.   Tylenol or motrin as needed for back pain - recheck if this is not improving.   Bacterial Vaginosis Bacterial vaginosis (BV) is a vaginal infection where the normal balance of bacteria in the vagina is disrupted. The normal balance is then replaced by an overgrowth of certain bacteria. There are several different kinds of bacteria that can cause BV. BV is the most common vaginal infection in women of childbearing age. CAUSES   The cause of BV is not fully understood. BV develops when there is an increase or imbalance of harmful bacteria.  Some activities or behaviors can upset the normal balance of bacteria in the vagina and put women at increased risk including:  Having a new sex partner or multiple sex partners.  Douching.  Using an intrauterine device (IUD) for contraception.  It is not clear what role sexual activity plays in the development of BV. However, women that have never had sexual intercourse are rarely infected with BV. Women do not get BV from toilet seats, bedding, swimming pools or from touching objects around them.  SYMPTOMS   Grey vaginal discharge.  A fish-like odor with discharge, especially after sexual intercourse.  Itching or burning of the vagina and vulva.  Burning or pain with urination.  Some women have no signs or symptoms at all. DIAGNOSIS  Your caregiver must examine  the vagina for signs of BV. Your caregiver will perform lab tests and look at the sample of vaginal fluid through a microscope. They will look for bacteria and abnormal cells (clue cells), a pH test higher than 4.5, and a positive amine test all associated with BV.  RISKS AND COMPLICATIONS   Pelvic inflammatory disease (PID).  Infections following gynecology surgery.  Developing HIV.  Developing herpes virus. TREATMENT  Sometimes BV will clear up without treatment. However, all women with symptoms of BV should be treated to avoid complications, especially if gynecology surgery is planned. Female partners generally do not need to be treated. However, BV may spread between female sex partners so treatment is helpful in preventing a recurrence of BV.   BV may be treated with antibiotics. The antibiotics come in either pill or vaginal cream forms. Either can be used with nonpregnant or pregnant women, but the recommended dosages differ. These antibiotics are not harmful to the baby.  BV can recur after treatment. If this happens, a second round of antibiotics will often be prescribed.  Treatment is important for pregnant women. If not treated, BV can cause a premature delivery, especially for a pregnant woman who had a premature birth in the past. All pregnant women who have symptoms of BV should be checked and treated.  For chronic reoccurrence of BV, treatment with a type of prescribed gel vaginally twice a week is helpful. HOME CARE INSTRUCTIONS   Finish all medication as directed by your caregiver.  Do not have sex until treatment is completed.  Tell your sexual partner that you have a vaginal infection. They should see their caregiver and be treated if they have problems, such as a mild rash or itching.  Practice safe sex. Use condoms. Only have 1 sex partner. PREVENTION  Basic prevention steps can help reduce the risk of upsetting the natural balance of bacteria in the vagina and  developing BV:  Do not have sexual intercourse (be abstinent).  Do not douche.  Use all of the medicine prescribed for treatment of BV, even if the signs and symptoms go away.  Tell your sex partner if you have BV. That way, they can be treated, if needed, to prevent reoccurrence. SEEK MEDICAL CARE IF:   Your symptoms are not improving after 3 days of treatment.  You have increased discharge, pain, or fever. MAKE SURE YOU:   Understand these instructions.  Will watch your condition.  Will get help right away if you are not doing well or get worse. FOR MORE INFORMATION  Division of STD Prevention (DSTDP), Centers for Disease Control and Prevention: [employer] American Social Health Association (ASHA): www.ashastd.org  Document Released: 09/07/2005 Document Revised: 11/30/2011 Document Reviewed: 02/28/2009 [facility] Patient Information 2014 [place], [place].

## 2013-04-29 ENCOUNTER — Telehealth: Payer: Self-pay

## 2013-04-29 DIAGNOSIS — N760 Acute vaginitis: Secondary | ICD-10-CM

## 2013-04-29 NOTE — Telephone Encounter (Signed)
The medicine she was prescribed by [place] is making her have stomach pains.  Would like for [place] just to call in clindamycin instead.  Please call [phone]

## 2013-04-30 NOTE — Telephone Encounter (Signed)
Please review labs. 

## 2013-05-01 MED ORDER — CLINDAMYCIN HCL 300 MG PO CAPS: 300.0000 mg | ORAL_CAPSULE | Freq: Two times a day (BID) | ORAL | Status: DC

## 2013-05-01 NOTE — Telephone Encounter (Signed)
Thanks. Called her to advise. She did not take the Flagyl, she states she does not want to take this after reading the insert

## 2013-05-01 NOTE — Telephone Encounter (Signed)
Labs were reviewed and noted by lab call:  Notes Recorded by Honor Loh, CMA on 04/29/2013 at 2:13 PM Patient notified and voiced understanding. Notes Recorded by Shade Flood, MD on 04/29/2013 at 1:54 PM Call patient - urine culture did not show any infection. Continue plan as discussed at last office visit.  If she is having stomach pains with flagyl, can try stopping Flagyl, start clindamycin - 300mg  BID for 1 week (Rx sent), but stop this medicine if it is causing upset stomach.  May need to consider cream if unable to tolerate these meds.

## 2013-06-08 ENCOUNTER — Other Ambulatory Visit (HOSPITAL_COMMUNITY): Payer: Self-pay | Admitting: Gynecology

## 2013-06-28 ENCOUNTER — Other Ambulatory Visit: Payer: Self-pay

## 2013-06-28 ENCOUNTER — Other Ambulatory Visit: Payer: Self-pay | Admitting: Gynecology

## 2013-06-28 DIAGNOSIS — Z1231 Encounter for screening mammogram for malignant neoplasm of breast: Secondary | ICD-10-CM

## 2013-07-20 ENCOUNTER — Encounter: Payer: Commercial Managed Care - PPO | Admitting: Gynecology

## 2013-07-20 ENCOUNTER — Other Ambulatory Visit (HOSPITAL_COMMUNITY): Payer: Self-pay | Admitting: Gynecology

## 2013-07-24 ENCOUNTER — Ambulatory Visit: Payer: Commercial Managed Care - PPO

## 2013-07-27 ENCOUNTER — Other Ambulatory Visit: Payer: Self-pay

## 2013-08-22 ENCOUNTER — Ambulatory Visit: Payer: Commercial Managed Care - PPO

## 2013-08-23 ENCOUNTER — Ambulatory Visit: Payer: Commercial Managed Care - PPO

## 2013-08-29 ENCOUNTER — Ambulatory Visit: Payer: Commercial Managed Care - PPO

## 2013-08-29 ENCOUNTER — Encounter: Payer: Commercial Managed Care - PPO | Admitting: Gynecology

## 2013-09-05 ENCOUNTER — Ambulatory Visit (INDEPENDENT_AMBULATORY_CARE_PROVIDER_SITE_OTHER): Payer: Commercial Managed Care - PPO | Admitting: Physician Assistant

## 2013-09-05 VITALS — BP 118/72 | HR 95 | Temp 98.0°F | Resp 18 | Ht 68.0 in | Wt 232.0 lb

## 2013-09-05 DIAGNOSIS — S61409A Unspecified open wound of unspecified hand, initial encounter: Secondary | ICD-10-CM

## 2013-09-05 DIAGNOSIS — S61451A Open bite of right hand, initial encounter: Secondary | ICD-10-CM

## 2013-09-05 MED ORDER — AMOXICILLIN-POT CLAVULANATE 875-125 MG PO TABS: 1.0000 | ORAL_TABLET | Freq: Two times a day (BID) | ORAL | Status: DC

## 2013-09-05 NOTE — Patient Instructions (Signed)
Begin taking the antibiotic as directed. Take with food and water to reduce stomach upset.  I expect improvement within the next 24-48 hours - if you are not improving, or are worsening in any way, please come back in so we can re-evaluate.  Finish the full course of medication even though you begin feeling better.  Use Tylenol or Advil if needed for pain relief.  Wash gently with soap and water.  You can keep the areas covered if it is more comfortable but you do not have to.    If you have increased redness, swelling, pain please come back in right away   Animal Bite An animal bite can result in a scratch on the skin, deep open cut, puncture of the skin, crush injury, or tearing away of the skin or a body part. Dogs are responsible for most animal bites. Children are bitten more often than adults. An animal bite can range from very mild to more serious. A small bite from your house pet is no cause for alarm. However, some animal bites can become infected or injure a bone or other tissue. You must seek medical care if:  The skin is broken and bleeding does not slow down or stop after 15 minutes.  The puncture is deep and difficult to clean (such as a cat bite).  Pain, warmth, redness, or pus develops around the wound.  The bite is from a stray animal or rodent. There may be a risk of rabies infection.  The bite is from a snake, raccoon, skunk, fox, coyote, or bat. There may be a risk of rabies infection.  The person bitten has a chronic illness such as diabetes, liver disease, or cancer, or the person takes medicine that lowers the immune system.  There is concern about the location and severity of the bite. It is important to clean and protect an animal bite wound right away to prevent infection. Follow these steps:  Clean the wound with plenty of water and soap.  Apply an antibiotic cream.  Apply gentle pressure over the wound with a clean towel or gauze to slow or stop  bleeding.  Elevate the affected area above the heart to help stop any bleeding.  Seek medical care. Getting medical care within 8 hours of the animal bite leads to the best possible outcome. DIAGNOSIS  Your caregiver will most likely:  Take a detailed history of the animal and the bite injury.  Perform a wound exam.  Take your medical history. Blood tests or X-rays may be performed. Sometimes, infected bite wounds are cultured and sent to a lab to identify the infectious bacteria.  TREATMENT  Medical treatment will depend on the location and type of animal bite as well as the patient's medical history. Treatment may include:  Wound care, such as cleaning and flushing the wound with saline solution, bandaging, and elevating the affected area.  Antibiotics.  Tetanus immunization.  Rabies immunization.  Leaving the wound open to heal. This is often done with animal bites, due to the high risk of infection. However, in certain cases, wound closure with stitches, wound adhesive, skin adhesive strips, or staples may be used. Infected bites that are left untreated may require intravenous (IV) antibiotics and surgical treatment in the hospital. HOME CARE INSTRUCTIONS  Follow your caregiver's instructions for wound care.  Take all medicines as directed.  If your caregiver prescribes antibiotics, take them as directed. Finish them even if you start to feel better.  Follow up  with your caregiver for further exams or immunizations as directed. You may need a tetanus shot if:  You cannot remember when you had your last tetanus shot.  You have never had a tetanus shot.  The injury broke your skin. If you get a tetanus shot, your arm may swell, get red, and feel warm to the touch. This is common and not a problem. If you need a tetanus shot and you choose not to have one, there is a rare chance of getting tetanus. Sickness from tetanus can be serious. SEEK MEDICAL CARE IF:  You notice  warmth, redness, soreness, swelling, pus discharge, or a bad smell coming from the wound.  You have a red line on the skin coming from the wound.  You have a fever, chills, or a general ill feeling.  You have nausea or vomiting.  You have continued or worsening pain.  You have trouble moving the injured part.  You have other questions or concerns. MAKE SURE YOU:  Understand these instructions.  Will watch your condition.  Will get help right away if you are not doing well or get worse. Document Released: 05/26/2011 Document Revised: 11/30/2011 Document Reviewed: 05/26/2011 [facility] Patient Information 2014 [place], [place].

## 2013-09-05 NOTE — Progress Notes (Signed)
   Subjective:    Patient ID: Crystal Williamson, female    DOB: [birth year], 47 y.o.   MRN: [id number]  Animal Bite  Pertinent negatives include no numbness and no weakness.     [name] is a very pleasant 47 yr old female here after unfortunately sustaining a cat bite to the right hand last evening.  The cat was feral but pt had been feeding this cat and several others.  With the help of a feral cat group, she was able to get the cats fixed and vaccinated.  She brings utd vaccination papers for the cat.  She picked up the cat last evening when cleaning the cage area - the cat was scared and subsequently scratched, bit her right hand.  She immediately washed the area with peroxide.  Has continued to wash the area with peroxide today.  Overnight the hand began to swell.  Now fairly tender.  Full AROM of hand, wrist, arm.  No fever, chills.  Last tetanus 2010.  Pt is right hand dominant.     Review of Systems  Constitutional: Negative for fever and chills.  Respiratory: Negative.   Cardiovascular: Negative.   Musculoskeletal: Positive for arthralgias (right hand) and joint swelling (right hand).  Skin: Positive for color change (right hand) and wound (right hand).  Neurological: Negative for weakness and numbness.       Objective:   Physical Exam  Vitals reviewed. Constitutional: She is oriented to person, place, and time. She appears well-developed and well-nourished. No distress.  HENT:  Head: Normocephalic and atraumatic.  Eyes: Conjunctivae are normal. No scleral icterus.  Cardiovascular: Normal rate, regular rhythm and normal heart sounds.   Pulmonary/Chest: Effort normal and breath sounds normal. She has no wheezes. She has no rales.  Musculoskeletal:       Hands: Multiple superficial scrapes and puncture wounds at right hand; moderate swelling and mild erythema at the dorsal surface of the hand; no lymphangitis; full AROM  Lymphadenopathy:    She has no axillary  adenopathy.       Right: No epitrochlear adenopathy present.  Neurological: She is alert and oriented to person, place, and time.  Skin: Skin is warm and dry.  Psychiatric: She has a normal mood and affect. Her behavior is normal.        Assessment & Plan:  Cat bite of hand, right, initial encounter - Plan: amoxicillin-clavulanate (AUGMENTIN) 875-125 MG per tablet   [name] is a pleasant 47 yr old female who suffered cat bites to her right hand last night.  There are multiple superficial wounds of the hand, none deep, none requiring repair.  She is utd on tetanus.  The animal is utd on vaccinations.  Will start amox/clav.  Encouraged pt to wash affected areas gently with soap and water.  Expect significant improvement over the next 24-48 hours.  If worsening or not improving, pt to RTC.    Meds ordered this encounter  Medications  . amoxicillin-clavulanate (AUGMENTIN) 875-125 MG per tablet    Sig: Take 1 tablet by mouth 2 (two) times daily.    Dispense:  20 tablet    Refill:  0    Order Specific Question:  Supervising Provider    Answer:  Ethelda Chick [[id number]]   Loleta Dicker MHS, PA-C [facility] 12/16/20145:58 PM

## 2013-09-25 ENCOUNTER — Encounter: Payer: Commercial Managed Care - PPO | Admitting: Gynecology

## 2013-10-03 ENCOUNTER — Ambulatory Visit: Payer: Commercial Managed Care - PPO

## 2013-10-10 ENCOUNTER — Other Ambulatory Visit (HOSPITAL_COMMUNITY): Payer: Self-pay | Admitting: Gynecology

## 2013-10-10 ENCOUNTER — Encounter: Payer: Self-pay | Admitting: Podiatry

## 2013-10-10 ENCOUNTER — Ambulatory Visit (INDEPENDENT_AMBULATORY_CARE_PROVIDER_SITE_OTHER): Payer: Commercial Managed Care - PPO | Admitting: Podiatry

## 2013-10-10 VITALS — BP 136/88 | HR 80 | Resp 16

## 2013-10-10 DIAGNOSIS — Z79899 Other long term (current) drug therapy: Secondary | ICD-10-CM

## 2013-10-10 DIAGNOSIS — B351 Tinea unguium: Secondary | ICD-10-CM

## 2013-10-10 MED ORDER — TERBINAFINE HCL 250 MG PO TABS: 250.0000 mg | ORAL_TABLET | Freq: Every day | ORAL | Status: DC

## 2013-10-10 NOTE — Progress Notes (Signed)
Left great toe , think the fungus is back . She denies any other problems or trauma to the toes.  Objective: I reviewed her past medical history medications and allergies. Evaluation of the toenails do demonstrate that the mycotic infection is returning. There is very superficial and distal at this point in time. I think a couple rounds of Lamisil would more than effective for her.  Assessment: Dermatophytosis bilateral.  Plan: Since she has no change in her past medical history medications or allergies at this point, when I go ahead and just started her on Lamisil 250 mg tablets one by mouth daily x30 with one refill. No blood work is necessary with no help or medication changes and she's had medicine and tolerated it well before.

## 2013-10-13 ENCOUNTER — Ambulatory Visit: Payer: Commercial Managed Care - PPO

## 2013-10-16 ENCOUNTER — Encounter: Payer: Commercial Managed Care - PPO | Admitting: Gynecology

## 2013-10-18 ENCOUNTER — Ambulatory Visit (INDEPENDENT_AMBULATORY_CARE_PROVIDER_SITE_OTHER): Payer: Commercial Managed Care - PPO | Admitting: Family Medicine

## 2013-10-18 ENCOUNTER — Ambulatory Visit: Payer: Commercial Managed Care - PPO

## 2013-10-18 VITALS — BP 110/70 | HR 89 | Temp 98.5°F | Resp 18 | Wt 233.0 lb

## 2013-10-18 DIAGNOSIS — M545 Low back pain, unspecified: Secondary | ICD-10-CM

## 2013-10-18 DIAGNOSIS — T148XXA Other injury of unspecified body region, initial encounter: Secondary | ICD-10-CM

## 2013-10-18 MED ORDER — CYCLOBENZAPRINE HCL 5 MG PO TABS: 5.0000 mg | ORAL_TABLET | Freq: Three times a day (TID) | ORAL | Status: DC | PRN

## 2013-10-18 MED ORDER — METHYLPREDNISOLONE ACETATE 80 MG/ML IJ SUSP
120.0000 mg | Freq: Once | INTRAMUSCULAR | Status: AC
  Administered 2013-10-18: 120 mg via INTRAMUSCULAR

## 2013-10-18 NOTE — Progress Notes (Signed)
Chief Complaint:  Chief Complaint  Patient presents with  . Back Pain    wants coritsone shot    HPI: Crystal Williamson is a 48 y.o. female who is here for low back pain x 7 days. NKI.  She took flexeril  TID and also used advil without relief Could barely get out of bed, NKI She felt her back popped last night, spasms and catches Last xray done at chiropractor was 2012, Dr Nita Sickle (chiropractor) and said she had mild DJD Was seen her chiropractor Dr Mel Almond  In last 2 days.  Was given Depomedrol in past and that has helped, no DM She is starting a new job and wants to be ok in her back    Past Medical History  Diagnosis Date  . Fibroid   . Headache(784.0)   . No pertinent past medical history    Past Surgical History  Procedure Laterality Date  . Septoplasty    . Wisdom tooth extraction    . Abdominal hysterectomy  06/01/2012    Procedure: HYSTERECTOMY ABDOMINAL;  Surgeon: Terrance Mass, MD;  Location: [facility];  Service: Gynecology;  Laterality: N/A;   History   Social History  . Marital Status: Married    Spouse Name: N/A    Number of Children: N/A  . Years of Education: N/A   Social History Main Topics  . Smoking status: Current Every Day Smoker -- 0.24 packs/day    Types: Cigarettes  . Smokeless tobacco: Former Systems developer    Quit date: 12/29/2012     Comment: 5 cig a day  . Alcohol Use: 10.5 oz/week  . Drug Use: No  . Sexual Activity: Yes    Birth Control/ Protection: None   Other Topics Concern  . None   Social History Narrative   ** Merged History Encounter **       Family History  Problem Relation Age of Onset  . Colon cancer Paternal Grandfather   . Diabetes Paternal Grandfather     type 2   . Cancer Paternal Grandfather     COLON  . Breast cancer Paternal Grandmother   . Stroke Maternal Grandmother   . Lupus Mother   . Lumbar disc disease Mother    Allergies  Allergen Reactions  . Codeine Itching    REACTION: N \\T \ V  .  Hydromorphone Itching  . Opium   . Percocet [Oxycodone-Acetaminophen] Itching   Prior to Admission medications   Medication Sig Start Date End Date Taking? Authorizing Provider  CA & PHOS-VIT D-MAG PO Take 3 tablets by mouth at bedtime.   Yes Historical Provider, MD  doxycycline (ORACEA) 40 MG capsule Take 40 mg by mouth every morning.   Yes Historical Provider, MD  estradiol (ESTRACE) 2 MG tablet TAKE ONE TABLET BY MOUTH ONCE DAILY...PATIENT OVERDUE FOR ANNUAL. KEEP YOUR DECEMBER APPOINTMENT AS SCHEDULED 10/10/13  Yes Terrance Mass, MD  Milk Thistle 200 MG CAPS Take 200-400 mg by mouth daily.   Yes Historical Provider, MD  Probiotic Product (PROBIOTIC DAILY PO) Take 1 capsule by mouth.   Yes Historical Provider, MD  terbinafine (LAMISIL) 250 MG tablet Take 250 mg by mouth daily.   Yes Historical Provider, MD     ROS: The patient denies fevers, chills, night sweats, unintentional weight loss, chest pain, palpitations, wheezing, dyspnea on exertion, nausea, vomiting, abdominal pain, dysuria, hematuria, melena, numbness, weakness, or tingling.   All other systems have been reviewed and were otherwise negative with  the exception of those mentioned in the HPI and as above.    PHYSICAL EXAM: Filed Vitals:   10/18/13 1209  BP: 110/70  Pulse: 89  Temp: 98.5 F (36.9 C)  Resp: 18   Filed Vitals:   10/18/13 1209  Weight: 233 lb (105.688 kg)   Body mass index is 35.44 kg/(m^2).  General: Alert, no acute distress HEENT:  Normocephalic, atraumatic, oropharynx patent. EOMI, PERRLA Cardiovascular:  Regular rate and rhythm, no rubs murmurs or gallops.  No Carotid bruits, radial pulse intact. No pedal edema.  Respiratory: Clear to auscultation bilaterally.  No wheezes, rales, or rhonchi.  No cyanosis, no use of accessory musculature GI: No organomegaly, abdomen is soft and non-tender, positive bowel sounds.  No masses. Skin: No rashes. Neurologic: Facial musculature symmetric. Psychiatric:  Patient is appropriate throughout our interaction. Lymphatic: No cervical lymphadenopathy Musculoskeletal: Gait intact. + paramsk tenderness  Decrease ROM 5/5 strength, 2/2 DTRs No saddle anesthesia Straight leg negative Hip and knee exam--normal    LABS: Results for orders placed in visit on 04/27/13  URINE CULTURE      Result Value Range   Colony Count NO GROWTH     Organism ID, Bacteria NO GROWTH    POCT URINALYSIS DIPSTICK      Result Value Range   Color, UA yellow     Clarity, UA clear     Glucose, UA neg     Bilirubin, UA neg     Ketones, UA neg     Spec Grav, UA <=1.005     Blood, UA neg     pH, UA 6.5     Protein, UA neg     Urobilinogen, UA 0.2     Nitrite, UA neg     Leukocytes, UA Negative    POCT UA - MICROSCOPIC ONLY      Result Value Range   WBC, Ur, HPF, POC neg     RBC, urine, microscopic neg     Bacteria, U Microscopic Trace     Mucus, UA neg     Epithelial cells, urine per micros 2-5     Crystals, Ur, HPF, POC neg     Casts, Ur, LPF, POC neg     Yeast, UA neg    POCT WET PREP WITH KOH      Result Value Range   Trichomonas, UA Negative     Clue Cells Wet Prep HPF POC POSITIVE     Epithelial Wet Prep HPF POC 10-15     Yeast Wet Prep HPF POC NEG     Bacteria Wet Prep HPF POC MODERATE     RBC Wet Prep HPF POC NEG     WBC Wet Prep HPF POC 0-3     KOH Prep POC Negative       EKG/XRAY:   Primary read interpreted by Dr. Marin Comment at [facility]. No fx or dislocation + DJD   ASSESSMENT/PLAN: Encounter Diagnoses  Name Primary?  . Low back pain Yes  . Sprain and strain    Depomedrol 120 mg IM x 1  Rx elexeril Xrays given to pt F/u prn  Gross sideeffects, risk and benefits, and alternatives of medications d/w patient. Patient is aware that all medications have potential sideeffects and we are unable to predict every sideeffect or drug-drug interaction that may occur.  Teion Ballin, Aline, DO 10/18/2013 1:24 PM

## 2013-10-20 ENCOUNTER — Ambulatory Visit
Admission: RE | Admit: 2013-10-20 | Discharge: 2013-10-20 | Disposition: A | Discharge status: Home / Self Care | Payer: Self-pay | Source: Ambulatory Visit

## 2013-10-20 DIAGNOSIS — Z1231 Encounter for screening mammogram for malignant neoplasm of breast: Secondary | ICD-10-CM

## 2013-11-02 ENCOUNTER — Other Ambulatory Visit (HOSPITAL_COMMUNITY): Payer: Self-pay | Admitting: Gynecology

## 2013-11-10 ENCOUNTER — Telehealth: Payer: Self-pay

## 2013-11-10 ENCOUNTER — Other Ambulatory Visit (HOSPITAL_COMMUNITY): Payer: Self-pay | Admitting: Gynecology

## 2013-11-10 NOTE — Telephone Encounter (Signed)
We will refill it at next office visit since she has canceled so often

## 2013-11-10 NOTE — Telephone Encounter (Signed)
Patient called because she only has #2 Estrace Tablets left.  The last time they were refilled she was told no more refills without appt. She has CE scheduled 11/14/13 but has rescheduled the last four times she had it scheduled. Several years since last CE.  She said she has two left and her appt is Tuesday.  Do you just want to have her wait and keep appt and do without Estrace for a few days or refill it?

## 2013-11-10 NOTE — Telephone Encounter (Signed)
Patient informed that Dr. Moshe Salisbury would prefer to wait and refill this at office visit on Tuesday.  She questioned why and I explained she was very overdue for CE and has cancelled and rescheduled the last four appointments and he would just be more comfortable seeing her before refilling it again since so long.

## 2013-11-14 ENCOUNTER — Encounter: Payer: Self-pay | Admitting: Gynecology

## 2013-11-14 ENCOUNTER — Ambulatory Visit (INDEPENDENT_AMBULATORY_CARE_PROVIDER_SITE_OTHER): Payer: Commercial Managed Care - PPO | Admitting: Gynecology

## 2013-11-14 VITALS — BP 118/76 | Ht 68.0 in | Wt 227.0 lb

## 2013-11-14 DIAGNOSIS — N951 Menopausal and female climacteric states: Secondary | ICD-10-CM

## 2013-11-14 DIAGNOSIS — Z79890 Hormone replacement therapy (postmenopausal): Secondary | ICD-10-CM

## 2013-11-14 DIAGNOSIS — Z01419 Encounter for gynecological examination (general) (routine) without abnormal findings: Secondary | ICD-10-CM

## 2013-11-14 DIAGNOSIS — Z780 Asymptomatic menopausal state: Secondary | ICD-10-CM

## 2013-11-14 MED ORDER — ESTRADIOL 0.52 MG/0.87 GM (0.06%) TD GEL: 1.0000 "application " | Freq: Every day | TRANSDERMAL | Status: DC

## 2013-11-14 NOTE — Progress Notes (Addendum)
Crystal Williamson Jun 10, 1966 [id number]   History:    48 y.o.  for annual gyn exam who has not been seen in the office since her six-week postop visit dating back to October 2013. Patient had undergone a total abdominal hysterectomy with ovarian conservation secondary to a 16 week size multilobulated uterus. Her pathology report was benign fibroid uterus and benign cervix endometrium. Patient is doing well. Patient had been on Estrace 2 mg daily for vasomotor symptoms. Preoperatively patient had been tested and had an elevated [place] before starting treatment. Patient has informed me that she is undergoing injections of her lower extremity for varicose veins.  Patient's grandfather had colon cancer Patient's grandmother had breast cancer on father's side and 2 aunts patient's last mammogram January 2015 normal  Past medical history,surgical history, family history and social history were all reviewed and documented in the EPIC chart.  Gynecologic History Patient's last menstrual period was 02/08/2012. Contraception: status post hysterectomy Last Pap: 2010. Results were: normal Last mammogram: 2015. Results were: normal  Obstetric History OB History  Gravida Para Term Preterm AB SAB TAB Ectopic Multiple Living  2 0 0 0 1 1 0 0 0 0     # Outcome Date GA Lbr Len/2nd Weight Sex Delivery Anes PTL Lv  2 SAB           1 GRA                ROS: A ROS was performed and pertinent positives and negatives are included in the history.  GENERAL: No fevers or chills. HEENT: No change in vision, no earache, sore throat or sinus congestion. NECK: No pain or stiffness. CARDIOVASCULAR: No chest pain or pressure. No palpitations. PULMONARY: No shortness of breath, cough or wheeze. GASTROINTESTINAL: No abdominal pain, nausea, vomiting or diarrhea, melena or bright red blood per rectum. GENITOURINARY: No urinary frequency, urgency, hesitancy or dysuria. MUSCULOSKELETAL: No joint or muscle pain, no back  pain, no recent trauma. DERMATOLOGIC: No rash, no itching, no lesions. ENDOCRINE: No polyuria, polydipsia, no heat or cold intolerance. No recent change in weight. HEMATOLOGICAL: No anemia or easy bruising or bleeding. NEUROLOGIC: No headache, seizures, numbness, tingling or weakness. PSYCHIATRIC: No depression, no loss of interest in normal activity or change in sleep pattern.     Exam: chaperone present  BP 118/76  Ht 5\' 8"  (1.727 m)  Wt 227 lb (102.967 kg)  BMI 34.52 kg/m2  LMP 02/08/2012  Body mass index is 34.52 kg/(m^2).  General appearance : Well developed well nourished female. No acute distress HEENT: Neck supple, trachea midline, no carotid bruits, no thyroidmegaly Lungs: Clear to auscultation, no rhonchi or wheezes, or rib retractions  Heart: Regular rate and rhythm, no murmurs or gallops Breast:Examined in sitting and supine position were symmetrical in appearance, no palpable masses or tenderness,  no skin retraction, no nipple inversion, no nipple discharge, no skin discoloration, no axillary or supraclavicular lymphadenopathy Abdomen: no palpable masses or tenderness, no rebound or guarding Extremities: no edema or skin discoloration or tenderness  Pelvic:  Bartholin, Urethra, Skene Glands: Within normal limits             Vagina: No gross lesions or discharge  Cervix: Absent  Uterus absent  Adnexa  Without masses or tenderness  Anus and perineum  normal   Rectovaginal  normal sphincter tone without palpated masses or tenderness             Hemoccult not indicated     Assessment/Plan:  48 y.o. female for annual exam with no abnormalities noted. We did have a discussion on different types of routes of administration of estrogen. I have recommended that we switched to a transdermal route minimize her risk of DVT especially with history of varicose veins and currently undergoing injections. We did have a discussion on the women's health initiative study. She will be  started on Elestrin 0.08% transdermal application one pump to 1 on only each bedtime. The risks benefits and pros and cons of ERT were discussed once again. We discussed the importance of monthly breast exams. Pap smear was not done today in accordance to the new guidelines. Patient will return back in a fasting state to have the following labs drawn: CBC, comprehensive metabolic panel, fasting lipid profile, and urinalysis and TSH. We discussed the importance of calcium and vitamin D in regular exercise for osteoporosis prevention. The patient will need a bone density study next year. Patient recently had x-ray of the lumbar spine as a result of low back pain there was no evidence of any acute fracture or subluxation. They did describe disc space flattening at L5-S1 level    Note: This dictation was prepared with  Dragon/digital dictation along withSmart phrase technology. Any transcriptional errors that result from this process are unintentional.   Terrance Mass MD, 2:12 PM 11/14/2013

## 2013-11-15 LAB — URINALYSIS W MICROSCOPIC + REFLEX CULTURE
Bacteria, UA: NONE SEEN
Bilirubin Urine: NEGATIVE
Casts: NONE SEEN
Crystals: NONE SEEN
Glucose, UA: NEGATIVE mg/dL
HGB URINE DIPSTICK: NEGATIVE
Ketones, ur: NEGATIVE mg/dL
Leukocytes, UA: NEGATIVE
Nitrite: NEGATIVE
PROTEIN: NEGATIVE mg/dL
Specific Gravity, Urine: 1.009 (ref 1.005–1.030)
Urobilinogen, UA: 0.2 mg/dL (ref 0.0–1.0)
pH: 7 (ref 5.0–8.0)

## 2013-11-24 ENCOUNTER — Other Ambulatory Visit: Payer: Commercial Managed Care - PPO

## 2013-11-27 ENCOUNTER — Other Ambulatory Visit: Payer: Commercial Managed Care - PPO

## 2013-11-27 LAB — CBC WITH DIFFERENTIAL/PLATELET
BASOS ABS: 0 10*3/uL (ref 0.0–0.1)
Basophils Relative: 0 % (ref 0–1)
Eosinophils Absolute: 0.1 10*3/uL (ref 0.0–0.7)
Eosinophils Relative: 1 % (ref 0–5)
HCT: 41.2 % (ref 36.0–46.0)
Hemoglobin: 14.2 g/dL (ref 12.0–15.0)
Lymphocytes Relative: 34 % (ref 12–46)
Lymphs Abs: 2.7 10*3/uL (ref 0.7–4.0)
MCH: 32.3 pg (ref 26.0–34.0)
MCHC: 34.5 g/dL (ref 30.0–36.0)
MCV: 93.8 fL (ref 78.0–100.0)
MONO ABS: 0.6 10*3/uL (ref 0.1–1.0)
Monocytes Relative: 8 % (ref 3–12)
NEUTROS ABS: 4.6 10*3/uL (ref 1.7–7.7)
Neutrophils Relative %: 57 % (ref 43–77)
PLATELETS: 205 10*3/uL (ref 150–400)
RBC: 4.39 MIL/uL (ref 3.87–5.11)
RDW: 13.6 % (ref 11.5–15.5)
WBC: 8 10*3/uL (ref 4.0–10.5)

## 2013-11-27 LAB — COMPREHENSIVE METABOLIC PANEL
ALBUMIN: 4.2 g/dL (ref 3.5–5.2)
ALK PHOS: 54 U/L (ref 39–117)
ALT: 46 U/L — AB (ref 0–35)
AST: 25 U/L (ref 0–37)
BUN: 17 mg/dL (ref 6–23)
CO2: 23 mEq/L (ref 19–32)
Calcium: 9.1 mg/dL (ref 8.4–10.5)
Chloride: 104 mEq/L (ref 96–112)
Creat: 0.47 mg/dL — ABNORMAL LOW (ref 0.50–1.10)
Glucose, Bld: 111 mg/dL — ABNORMAL HIGH (ref 70–99)
POTASSIUM: 4.3 meq/L (ref 3.5–5.3)
Sodium: 136 mEq/L (ref 135–145)
Total Bilirubin: 0.6 mg/dL (ref 0.2–1.2)
Total Protein: 6.5 g/dL (ref 6.0–8.3)

## 2013-11-27 LAB — LIPID PANEL
Cholesterol: 170 mg/dL (ref 0–200)
HDL: 86 mg/dL (ref >39)
LDL CALC: 73 mg/dL (ref 0–99)
Total CHOL/HDL Ratio: 2 Ratio
Triglycerides: 54 mg/dL (ref <150)
VLDL: 11 mg/dL (ref 0–40)

## 2013-11-27 LAB — TSH: TSH: 1.223 u[IU]/mL (ref 0.350–4.500)

## 2013-11-28 ENCOUNTER — Telehealth: Payer: Self-pay

## 2013-11-28 ENCOUNTER — Other Ambulatory Visit: Payer: Self-pay | Admitting: Gynecology

## 2013-11-28 DIAGNOSIS — R945 Abnormal results of liver function studies: Principal | ICD-10-CM

## 2013-11-28 DIAGNOSIS — R7309 Other abnormal glucose: Secondary | ICD-10-CM

## 2013-11-28 DIAGNOSIS — R7989 Other specified abnormal findings of blood chemistry: Secondary | ICD-10-CM

## 2013-11-28 NOTE — Telephone Encounter (Signed)
Message copied by Ramond Craver on Tue Nov 28, 2013  4:50 PM ------      Message from: Terrance Mass      Created: Tue Nov 28, 2013  9:29 AM       Please inform patient that her blood sugar was in the pre-diabetes range and her creatinine was slightly low and her liver function was slightly elevated. I would like to repeat a fasting blood sugar, serum creatinine and SGOT and SGPT in one week. If still abnormal will need further evaluation. ------

## 2013-11-28 NOTE — Telephone Encounter (Signed)
I called patient about results. She mentioned that she has had some dizziness off and on at work.  I told her that she should see her PCP for work up for this. I told her electrolytes and iron level fine on panel.  She said PCP is [facility] provider and I told her he would be able to view her labs in her chart.

## 2013-11-29 ENCOUNTER — Telehealth: Payer: Self-pay | Admitting: *Deleted

## 2013-11-29 ENCOUNTER — Other Ambulatory Visit: Payer: Self-pay | Admitting: *Deleted

## 2013-11-29 ENCOUNTER — Other Ambulatory Visit: Payer: Commercial Managed Care - PPO

## 2013-11-29 DIAGNOSIS — R221 Localized swelling, mass and lump, neck: Secondary | ICD-10-CM

## 2013-11-29 DIAGNOSIS — J020 Streptococcal pharyngitis: Secondary | ICD-10-CM

## 2013-11-29 NOTE — Telephone Encounter (Signed)
The patient is waiting in the lab area of the office for answer for the below.

## 2013-11-29 NOTE — Telephone Encounter (Signed)
kari check with JF and this has been done.

## 2013-11-29 NOTE — Telephone Encounter (Signed)
Pt said her mother tested positive for strep, pt has sore throat. Would like to have test to check for strep? Please advise

## 2013-11-30 ENCOUNTER — Other Ambulatory Visit: Payer: Self-pay | Admitting: *Deleted

## 2013-11-30 LAB — RAPID STREP SCREEN (MED CTR MEBANE ONLY): Streptococcus, Group A Screen (Direct): POSITIVE — AB

## 2013-11-30 MED ORDER — FLUCONAZOLE 150 MG PO TABS: 150.0000 mg | ORAL_TABLET | Freq: Once | ORAL | Status: DC

## 2013-11-30 MED ORDER — PENICILLIN V POTASSIUM 500 MG PO TABS: 500.0000 mg | ORAL_TABLET | Freq: Four times a day (QID) | ORAL | Status: DC

## 2014-01-09 ENCOUNTER — Ambulatory Visit: Payer: Commercial Managed Care - PPO | Admitting: Podiatry

## 2014-01-18 ENCOUNTER — Ambulatory Visit: Payer: Commercial Managed Care - PPO | Admitting: Podiatry

## 2014-01-29 ENCOUNTER — Encounter: Payer: Self-pay | Admitting: Family Medicine

## 2014-01-29 ENCOUNTER — Ambulatory Visit (INDEPENDENT_AMBULATORY_CARE_PROVIDER_SITE_OTHER): Payer: Commercial Managed Care - PPO | Admitting: Family Medicine

## 2014-01-29 ENCOUNTER — Other Ambulatory Visit: Payer: Self-pay | Admitting: Family Medicine

## 2014-01-29 VITALS — BP 110/86 | HR 75 | Temp 98.3°F | Resp 16 | Ht 68.0 in | Wt 214.2 lb

## 2014-01-29 DIAGNOSIS — R740 Nonspecific elevation of levels of transaminase and lactic acid dehydrogenase [LDH]: Secondary | ICD-10-CM

## 2014-01-29 DIAGNOSIS — R7309 Other abnormal glucose: Secondary | ICD-10-CM

## 2014-01-29 DIAGNOSIS — Z0000 Encounter for general adult medical examination without abnormal findings: Secondary | ICD-10-CM

## 2014-01-29 DIAGNOSIS — R7401 Elevation of levels of liver transaminase levels: Secondary | ICD-10-CM

## 2014-01-29 LAB — COMPREHENSIVE METABOLIC PANEL
ALBUMIN: 4.5 g/dL (ref 3.5–5.2)
ALT: 46 U/L — ABNORMAL HIGH (ref 0–35)
AST: 31 U/L (ref 0–37)
Alkaline Phosphatase: 61 U/L (ref 39–117)
BUN: 8 mg/dL (ref 6–23)
CHLORIDE: 105 meq/L (ref 96–112)
CO2: 25 mEq/L (ref 19–32)
Calcium: 9.4 mg/dL (ref 8.4–10.5)
Creat: 0.54 mg/dL (ref 0.50–1.10)
Glucose, Bld: 101 mg/dL — ABNORMAL HIGH (ref 70–99)
POTASSIUM: 4.4 meq/L (ref 3.5–5.3)
Sodium: 140 mEq/L (ref 135–145)
TOTAL PROTEIN: 6.9 g/dL (ref 6.0–8.3)
Total Bilirubin: 0.6 mg/dL (ref 0.2–1.2)

## 2014-01-29 LAB — HEMOGLOBIN A1C
HEMOGLOBIN A1C: 5.8 % — AB (ref <5.7)
Mean Plasma Glucose: 120 mg/dL — ABNORMAL HIGH (ref <117)

## 2014-01-29 NOTE — Patient Instructions (Signed)
Congratulations on your excellent weight loss!  Keep up the good work and I will be in touch with your labs via mychart.  Please come and see me in about one year unless your labs indicate otherwise.   Remember your annual mammogram and flu shot.

## 2014-01-29 NOTE — Progress Notes (Signed)
[facility] [address], [place] [address] [phone]  Date:  01/29/2014   Name:  Crystal Williamson   DOB:  Jul 14, 1966   MRN:  [id number]  PCP:  Lamar Blinks, MD    Chief Complaint: Annual Exam   History of Present Illness:  Crystal Williamson is a 48 y.o. very pleasant female patient who presents with the following:  She is here today for a CPE.  She is doing well.  She has a history of hysterectomy, and sees Dr. Toney Rakes for her GYN care.   She is UTD on her mammogram  She has rosacea, which is under pretty good control per her derm in [place].    Wt Readings from Last 3 Encounters:  01/29/14 214 lb 3.2 oz (97.16 kg)  11/14/13 227 lb (102.967 kg)  10/18/13 233 lb (105.688 kg)   She is getting plenty of exercise at work, and is watching her diet.  She was asked to come and see [place] to recheck borderline glucose and mild elevation of an LFT.  She would like to lose more weight, and had not really been aware of her excellent progress so far.   She had a Tdap in 2010.    Patient Active Problem List   Diagnosis Date Noted  . Alopecia 07/19/2012  . 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 HEADACHE 08/06/2009  . KERATOSIS PILARIS 06/14/2009  . HYPERGLYCEMIA 02/22/2009  . DERMATOPHYTOSIS OF NAIL 02/14/2009  . OBESITY 02/14/2009  . SEBORRHEIC KERATOSIS 02/14/2009  . LOW BACK PAIN, CHRONIC 02/14/2009  . FATIGUE 02/14/2009  . ATTENTION DEFICIT DISORDER, HX OF 02/14/2009    Past Medical History  Diagnosis Date  . Fibroid   . Headache(784.0)   . No pertinent past medical history   . Varicose veins     bilateral    Past Surgical History  Procedure Laterality Date  . Septoplasty    . Wisdom tooth extraction    . Abdominal hysterectomy  06/01/2012    Procedure: HYSTERECTOMY ABDOMINAL;  Surgeon: Terrance Mass, MD;  Location: [facility];  Service: Gynecology;  Laterality: N/A;    History  Substance Use Topics  . Smoking status: Current Every Day  Smoker -- 0.20 packs/day    Types: Cigarettes  . Smokeless tobacco: Former Systems developer    Quit date: 12/29/2012     Comment: 5 cig a day  . Alcohol Use: 10.5 oz/week     Comment: 3-4x week     Family History  Problem Relation Age of Onset  . Colon cancer Paternal Grandfather   . Diabetes Paternal Grandfather     type 2   . Cancer Paternal Grandfather     COLON  . Breast cancer Paternal Grandmother   . Stroke Maternal Grandmother   . Lupus Mother   . Lumbar disc disease Mother     Allergies  Allergen Reactions  . Codeine Itching    REACTION: N \\T \ V  . Hydromorphone Itching  . Opium   . Percocet [Oxycodone-Acetaminophen] Itching    Medication list has been reviewed and updated.  Current Outpatient Prescriptions on File Prior to Visit  Medication Sig Dispense Refill  . CA & PHOS-VIT D-MAG PO Take 3 tablets by mouth at bedtime.      . cyclobenzaprine (FLEXERIL) 5 MG tablet Take 1 tablet (5 mg total) by mouth 3 (three) times daily as needed.  30 tablet  0  . doxycycline (ORACEA) 40 MG capsule Take 40 mg by mouth every  morning.      . Estradiol 0.52 MG/0.87 GM (0.06%) GEL Apply 1 application topically daily. Apply to one arm daily only  1 Bottle  11  . metroNIDAZOLE (METROGEL) 1 % gel Apply topically daily.      . Milk Thistle 200 MG CAPS Take 200-400 mg by mouth daily.      . Probiotic Product (PROBIOTIC DAILY PO) Take 1 capsule by mouth.      . Brimonidine Tartrate (MIRVASO) 0.33 % GEL Apply topically.      . fluconazole (DIFLUCAN) 150 MG tablet Take 1 tablet (150 mg total) by mouth once.  3 tablet  0  . penicillin v potassium (VEETID) 500 MG tablet Take 1 tablet (500 mg total) by mouth 4 (four) times daily.  40 tablet  0  . terbinafine (LAMISIL) 250 MG tablet Take 250 mg by mouth daily.       No current facility-administered medications on file prior to visit.    Review of Systems:  As per HPI- otherwise negative.   Physical Examination: Filed Vitals:   01/29/14 1053   BP: 110/86  Pulse: 75  Temp: 98.3 F (36.8 C)  Resp: 16   Filed Vitals:   01/29/14 1053  Height: 5\' 8"  (1.727 m)  Weight: 214 lb 3.2 oz (97.16 kg)   Body mass index is 32.58 kg/(m^2). Ideal Body Weight: Weight in (lb) to have BMI = 25: 164.1  GEN: WDWN, NAD, Non-toxic, A & O x 3, obese, looks well HEENT: Atraumatic, Normocephalic. Neck supple. No masses, No LAD.  Bilateral TM wnl, oropharynx normal.  PEERL,EOMI.   Ears and Nose: No external deformity. CV: RRR, No M/G/R. No JVD. No thrill. No extra heart sounds. PULM: CTA B, no wheezes, crackles, rhonchi. No retractions. No resp. distress. No accessory muscle use. ABD: S, NT, ND, +BS. No rebound. No HSM. EXTR: No c/c/e NEURO Normal gait.  PSYCH: Normally interactive. Conversant. Not depressed or anxious appearing.  Calm demeanor.    Assessment and Plan: Physical exam - Plan: Comprehensive metabolic panel  Elevated glucose - Plan: Hemoglobin A1c  Congratulated her on her weight loss.  She is pleased and will keep it up.  Recheck her LFT, glucose and A1c today.   Will be in touch with her labs immunizations UTD See patient instructions for more details.     Signed Lamar Blinks, MD

## 2014-01-30 LAB — HEPATITIS PANEL, ACUTE
HCV Ab: NEGATIVE
Hep A IgM: NONREACTIVE
Hep B C IgM: NONREACTIVE
Hepatitis B Surface Ag: NEGATIVE

## 2014-01-30 NOTE — Addendum Note (Signed)
Addended by: Lamar Blinks C on: 01/30/2014 08:33 AM   Modules accepted: Orders

## 2014-02-19 ENCOUNTER — Ambulatory Visit
Admission: RE | Admit: 2014-02-19 | Discharge: 2014-02-19 | Disposition: A | Discharge status: Home / Self Care | Payer: Commercial Managed Care - PPO | Source: Ambulatory Visit | Attending: Family Medicine | Admitting: Family Medicine

## 2014-02-19 DIAGNOSIS — R740 Nonspecific elevation of levels of transaminase and lactic acid dehydrogenase [LDH]: Principal | ICD-10-CM

## 2014-02-19 DIAGNOSIS — R7401 Elevation of levels of liver transaminase levels: Secondary | ICD-10-CM

## 2014-07-19 ENCOUNTER — Telehealth: Payer: Self-pay | Admitting: Radiology

## 2014-07-19 NOTE — Telephone Encounter (Signed)
Patient states she has had a reaction to a flu shot given to her at work, and she wants to know if this will affect her labs scheduled for Monday, she states she is to have liver function tests, I have advised her no, but we do need to see her for the reaction (she states her lymph nodes are swollen, she has improved but she still has swollen nodes under her right arm following flu shot from 5 days ago). She will have you look at it when she is here on Monday, and she will call back if anything changes between now and then. To you FYI

## 2014-07-23 ENCOUNTER — Ambulatory Visit: Payer: Commercial Managed Care - PPO | Admitting: Family Medicine

## 2014-07-23 ENCOUNTER — Encounter: Payer: Self-pay | Admitting: Family Medicine

## 2014-09-03 DIAGNOSIS — M79673 Pain in unspecified foot: Secondary | ICD-10-CM

## 2014-09-18 ENCOUNTER — Ambulatory Visit: Payer: Commercial Managed Care - PPO | Admitting: Podiatry

## 2014-11-01 ENCOUNTER — Other Ambulatory Visit: Payer: Self-pay

## 2014-11-01 DIAGNOSIS — Z1231 Encounter for screening mammogram for malignant neoplasm of breast: Secondary | ICD-10-CM

## 2014-11-06 ENCOUNTER — Ambulatory Visit
Admission: RE | Admit: 2014-11-06 | Discharge: 2014-11-06 | Disposition: A | Discharge status: Home / Self Care | Payer: Commercial Managed Care - PPO | Source: Ambulatory Visit

## 2014-11-06 DIAGNOSIS — Z1231 Encounter for screening mammogram for malignant neoplasm of breast: Secondary | ICD-10-CM

## 2014-11-15 ENCOUNTER — Encounter: Payer: Self-pay | Admitting: Gynecology

## 2014-11-15 ENCOUNTER — Ambulatory Visit (INDEPENDENT_AMBULATORY_CARE_PROVIDER_SITE_OTHER): Payer: Commercial Managed Care - PPO | Admitting: Gynecology

## 2014-11-15 VITALS — BP 122/78 | Ht 67.5 in | Wt 218.0 lb

## 2014-11-15 DIAGNOSIS — Z780 Asymptomatic menopausal state: Secondary | ICD-10-CM

## 2014-11-15 DIAGNOSIS — Z79890 Hormone replacement therapy (postmenopausal): Secondary | ICD-10-CM

## 2014-11-15 DIAGNOSIS — Z01419 Encounter for gynecological examination (general) (routine) without abnormal findings: Secondary | ICD-10-CM

## 2014-11-15 MED ORDER — ESTRADIOL 0.52 MG/0.87 GM (0.06%) TD GEL: 1.0000 "application " | Freq: Every day | TRANSDERMAL | Status: DC

## 2014-11-15 NOTE — Progress Notes (Signed)
Crystal Williamson [facility] 06-20-1966 [id number]   History:    49 y.o.  for annual gyn exam with no complaints today. Patient with prior history of total abdominal hysterectomy with ovarian conservation secondary to a 16 week size multilobulated leiomyomatous uteri. Her pathology was benign. Patient previously has been tested with an [facility] indicated she was menopausal and she was originally on Estrace 2 mg by mouth daily for vasomotor symptoms and is now on Elestrin 0.06% daily transdermal application and has done well. Her PCP we'll be doing her blood work in May. Her vaccines are up-to-date. Patient prior to her hysterectomy had no history of abnormal Pap smears.  Past medical history,surgical history, family history and social history were all reviewed and documented in the EPIC chart.  Gynecologic History Patient's last menstrual period was 02/08/2012. Contraception: status post hysterectomy Last Pap: 2010. Results were: normal Last mammogram: 2016. Results were: normal  Obstetric History OB History  Gravida Para Term Preterm AB SAB TAB Ectopic Multiple Living  2 0 0 0 1 1 0 0 0 0     # Outcome Date GA Lbr Len/2nd Weight Sex Delivery Anes PTL Lv  2 SAB           1 Gravida                ROS: A ROS was performed and pertinent positives and negatives are included in the history.  GENERAL: No fevers or chills. HEENT: No change in vision, no earache, sore throat or sinus congestion. NECK: No pain or stiffness. CARDIOVASCULAR: No chest pain or pressure. No palpitations. PULMONARY: No shortness of breath, cough or wheeze. GASTROINTESTINAL: No abdominal pain, nausea, vomiting or diarrhea, melena or bright red blood per rectum. GENITOURINARY: No urinary frequency, urgency, hesitancy or dysuria. MUSCULOSKELETAL: No joint or muscle pain, no back pain, no recent trauma. DERMATOLOGIC: No rash, no itching, no lesions. ENDOCRINE: No polyuria, polydipsia, no heat or cold intolerance. No recent change in  weight. HEMATOLOGICAL: No anemia or easy bruising or bleeding. NEUROLOGIC: No headache, seizures, numbness, tingling or weakness. PSYCHIATRIC: No depression, no loss of interest in normal activity or change in sleep pattern.     Exam: chaperone present  BP 122/78 mmHg  Ht 5' 7.5" (1.715 m)  Wt 218 lb (98.884 kg)  BMI 33.62 kg/m2  LMP 02/08/2012  Body mass index is 33.62 kg/(m^2).  General appearance : Well developed well nourished female. No acute distress HEENT: Eyes: no retinal hemorrhage or exudates,  Neck supple, trachea midline, no carotid bruits, no thyroidmegaly Lungs: Clear to auscultation, no rhonchi or wheezes, or rib retractions  Heart: Regular rate and rhythm, no murmurs or gallops Breast:Examined in sitting and supine position were symmetrical in appearance, no palpable masses or tenderness,  no skin retraction, no nipple inversion, no nipple discharge, no skin discoloration, no axillary or supraclavicular lymphadenopathy Abdomen: no palpable masses or tenderness, no rebound or guarding Extremities: no edema or skin discoloration or tenderness  Pelvic:  Bartholin, Urethra, Skene Glands: Within normal limits             Vagina: No gross lesions or discharge  Cervix: Absent  Uterus  absent  Adnexa  Without masses or tenderness  Anus and perineum  normal   Rectovaginal  normal sphincter tone without palpated masses or tenderness             Hemoccult not indicated     Assessment/Plan:  49 y.o. female for annual exam doing well on transdermal  estrogen daily. Her PCP will be doing her blood work. Pap smear no longer indicated no past history of abnormal Pap smears prior to her hysterectomy. We discussed importance of calcium vitamin D along with regular exercises for osteoporosis prevention. We discussed importance of monthly breast exam.   Terrance Mass MD, 11:13 AM 11/15/2014

## 2015-01-12 ENCOUNTER — Other Ambulatory Visit: Payer: Self-pay | Admitting: Gynecology

## 2015-02-20 ENCOUNTER — Telehealth: Payer: Self-pay | Admitting: *Deleted

## 2015-02-20 NOTE — Telephone Encounter (Signed)
Pt called requesting a coupon for elestrin 0.06 % gel, left on voicemail coupon left up front for pick up

## 2015-05-28 ENCOUNTER — Other Ambulatory Visit: Payer: Self-pay

## 2015-05-28 DIAGNOSIS — I8390 Asymptomatic varicose veins of unspecified lower extremity: Secondary | ICD-10-CM

## 2015-06-04 ENCOUNTER — Encounter: Payer: Self-pay | Admitting: Vascular Surgery

## 2015-06-05 ENCOUNTER — Encounter (HOSPITAL_COMMUNITY): Payer: Commercial Managed Care - PPO

## 2015-06-05 ENCOUNTER — Encounter: Payer: Commercial Managed Care - PPO | Admitting: Vascular Surgery

## 2015-06-20 ENCOUNTER — Other Ambulatory Visit: Payer: Self-pay | Admitting: Family Medicine

## 2015-06-20 ENCOUNTER — Encounter: Payer: Self-pay | Admitting: Family Medicine

## 2015-06-20 ENCOUNTER — Ambulatory Visit (INDEPENDENT_AMBULATORY_CARE_PROVIDER_SITE_OTHER): Payer: BLUE CROSS/BLUE SHIELD | Admitting: Family Medicine

## 2015-06-20 VITALS — BP 116/81 | HR 84 | Temp 98.5°F | Resp 16 | Ht 68.0 in | Wt 230.6 lb

## 2015-06-20 DIAGNOSIS — Z1389 Encounter for screening for other disorder: Secondary | ICD-10-CM | POA: Diagnosis not present

## 2015-06-20 DIAGNOSIS — Z1321 Encounter for screening for nutritional disorder: Secondary | ICD-10-CM

## 2015-06-20 DIAGNOSIS — R740 Nonspecific elevation of levels of transaminase and lactic acid dehydrogenase [LDH]: Secondary | ICD-10-CM | POA: Diagnosis not present

## 2015-06-20 DIAGNOSIS — G8929 Other chronic pain: Secondary | ICD-10-CM

## 2015-06-20 DIAGNOSIS — Z130 Encounter for screening for diseases of the blood and blood-forming organs and certain disorders involving the immune mechanism: Secondary | ICD-10-CM | POA: Diagnosis not present

## 2015-06-20 DIAGNOSIS — Z0000 Encounter for general adult medical examination without abnormal findings: Secondary | ICD-10-CM | POA: Diagnosis not present

## 2015-06-20 DIAGNOSIS — L719 Rosacea, unspecified: Secondary | ICD-10-CM | POA: Diagnosis not present

## 2015-06-20 DIAGNOSIS — M545 Low back pain: Secondary | ICD-10-CM

## 2015-06-20 DIAGNOSIS — R7309 Other abnormal glucose: Secondary | ICD-10-CM

## 2015-06-20 DIAGNOSIS — Z136 Encounter for screening for cardiovascular disorders: Secondary | ICD-10-CM | POA: Diagnosis not present

## 2015-06-20 DIAGNOSIS — Z113 Encounter for screening for infections with a predominantly sexual mode of transmission: Secondary | ICD-10-CM | POA: Diagnosis not present

## 2015-06-20 DIAGNOSIS — Z1329 Encounter for screening for other suspected endocrine disorder: Secondary | ICD-10-CM | POA: Diagnosis not present

## 2015-06-20 DIAGNOSIS — E669 Obesity, unspecified: Secondary | ICD-10-CM

## 2015-06-20 DIAGNOSIS — Z1383 Encounter for screening for respiratory disorder NEC: Secondary | ICD-10-CM

## 2015-06-20 DIAGNOSIS — R7401 Elevation of levels of liver transaminase levels: Secondary | ICD-10-CM

## 2015-06-20 LAB — COMPREHENSIVE METABOLIC PANEL
ALK PHOS: 65 U/L (ref 33–115)
ALT: 28 U/L (ref 6–29)
AST: 21 U/L (ref 10–35)
Albumin: 4.4 g/dL (ref 3.6–5.1)
BILIRUBIN TOTAL: 0.7 mg/dL (ref 0.2–1.2)
BUN: 13 mg/dL (ref 7–25)
CALCIUM: 9.4 mg/dL (ref 8.6–10.2)
CO2: 26 mmol/L (ref 20–31)
Chloride: 104 mmol/L (ref 98–110)
Creat: 0.58 mg/dL (ref 0.50–1.10)
Glucose, Bld: 112 mg/dL — ABNORMAL HIGH (ref 65–99)
Potassium: 4.2 mmol/L (ref 3.5–5.3)
Sodium: 137 mmol/L (ref 135–146)
TOTAL PROTEIN: 6.9 g/dL (ref 6.1–8.1)

## 2015-06-20 LAB — CBC
HEMATOCRIT: 43.2 % (ref 36.0–46.0)
HEMOGLOBIN: 14.6 g/dL (ref 12.0–15.0)
MCH: 31.7 pg (ref 26.0–34.0)
MCHC: 33.8 g/dL (ref 30.0–36.0)
MCV: 93.9 fL (ref 78.0–100.0)
MPV: 11.4 fL (ref 8.6–12.4)
Platelets: 231 10*3/uL (ref 150–400)
RBC: 4.6 MIL/uL (ref 3.87–5.11)
RDW: 13.7 % (ref 11.5–15.5)
WBC: 7.2 10*3/uL (ref 4.0–10.5)

## 2015-06-20 LAB — POCT URINALYSIS DIP (MANUAL ENTRY)
BILIRUBIN UA: NEGATIVE
Blood, UA: NEGATIVE
Glucose, UA: NEGATIVE
Ketones, POC UA: NEGATIVE
LEUKOCYTES UA: NEGATIVE
NITRITE UA: NEGATIVE
Protein Ur, POC: NEGATIVE
Spec Grav, UA: 1.01
Urobilinogen, UA: 0.2
pH, UA: 7

## 2015-06-20 LAB — LIPID PANEL
CHOLESTEROL: 180 mg/dL (ref 125–200)
HDL: 87 mg/dL (ref >=46)
LDL Cholesterol: 83 mg/dL (ref <130)
TRIGLYCERIDES: 48 mg/dL (ref <150)
Total CHOL/HDL Ratio: 2.1 Ratio (ref <=5.0)
VLDL: 10 mg/dL (ref <30)

## 2015-06-20 LAB — POCT GLYCOSYLATED HEMOGLOBIN (HGB A1C): Hemoglobin A1C: 5.3

## 2015-06-20 MED ORDER — DOXYCYCLINE 40 MG PO CPDR: 40.0000 mg | DELAYED_RELEASE_CAPSULE | ORAL | Status: DC

## 2015-06-20 MED ORDER — CYCLOBENZAPRINE HCL 5 MG PO TABS: 5.0000 mg | ORAL_TABLET | Freq: Three times a day (TID) | ORAL | Status: DC | PRN

## 2015-06-20 NOTE — Patient Instructions (Signed)
Insomnia Insomnia is frequent trouble falling and/or staying asleep. Insomnia can be a long term problem or a short term problem. Both are common. Insomnia can be a short term problem when the wakefulness is related to a certain stress or worry. Long term insomnia is often related to ongoing stress during waking hours and/or poor sleeping habits. Overtime, sleep deprivation itself can make the problem worse. Every little thing feels more severe because you are overtired and your ability to cope is decreased. CAUSES   Stress, anxiety, and depression.  Poor sleeping habits.  Distractions such as TV in the bedroom.  Naps close to bedtime.  Engaging in emotionally charged conversations before bed.  Technical reading before sleep.  Alcohol and other sedatives. They may make the problem worse. They can hurt normal sleep patterns and normal dream activity.  Stimulants such as caffeine for several hours prior to bedtime.  Pain syndromes and shortness of breath can cause insomnia.  Exercise late at night.  Changing time zones may cause sleeping problems (jet lag). It is sometimes helpful to have someone observe your sleeping patterns. They should look for periods of not breathing during the night (sleep apnea). They should also look to see how long those periods last. If you live alone or observers are uncertain, you can also be observed at a sleep clinic where your sleep patterns will be professionally monitored. Sleep apnea requires a checkup and treatment. Give your caregivers your medical history. Give your caregivers observations your family has made about your sleep.  SYMPTOMS   Not feeling rested in the morning.  Anxiety and restlessness at bedtime.  Difficulty falling and staying asleep. TREATMENT   Your caregiver may prescribe treatment for an underlying medical disorders. Your caregiver can give advice or help if you are using alcohol or other drugs for self-medication. Treatment  of underlying problems will usually eliminate insomnia problems.  Medications can be prescribed for short time use. They are generally not recommended for lengthy use.  Over-the-counter sleep medicines are not recommended for lengthy use. They can be habit forming.  You can promote easier sleeping by making lifestyle changes such as:  Using relaxation techniques that help with breathing and reduce muscle tension.  Exercising earlier in the day.  Changing your diet and the time of your last meal. No night time snacks.  Establish a regular time to go to bed.  Counseling can help with stressful problems and worry.  Soothing music and white noise may be helpful if there are background noises you cannot remove.  Stop tedious detailed work at least one hour before bedtime. HOME CARE INSTRUCTIONS   Keep a diary. Inform your caregiver about your progress. This includes any medication side effects. See your caregiver regularly. Take note of:  Times when you are asleep.  Times when you are awake during the night.  The quality of your sleep.  How you feel the next day. This information will help your caregiver care for you.  Get out of bed if you are still awake after 15 minutes. Read or do some quiet activity. Keep the lights down. Wait until you feel sleepy and go back to bed.  Keep regular sleeping and waking hours. Avoid naps.  Exercise regularly.  Avoid distractions at bedtime. Distractions include watching television or engaging in any intense or detailed activity like attempting to balance the household checkbook.  Develop a bedtime ritual. Keep a familiar routine of bathing, brushing your teeth, climbing into bed at the same  time each night, listening to soothing music. Routines increase the success of falling to sleep faster.  Use relaxation techniques. This can be using breathing and muscle tension release routines. It can also include visualizing peaceful scenes. You can  also help control troubling or intruding thoughts by keeping your mind occupied with boring or repetitive thoughts like the old concept of counting sheep. You can make it more creative like imagining planting one beautiful flower after another in your backyard garden.  During your day, work to eliminate stress. When this is not possible use some of the previous suggestions to help reduce the anxiety that accompanies stressful situations. MAKE SURE YOU:   Understand these instructions.  Will watch your condition.  Will get help right away if you are not doing well or get worse. Document Released: 09/04/2000 Document Revised: 11/30/2011 Document Reviewed: 10/05/2007 [facility] Patient Information 2015 [place], [place]. This information is not intended to replace advice given to you by your health care provider. Make sure you discuss any questions you have with your health care provider.  Health Maintenance Adopting a healthy lifestyle and getting preventive care can go a long way to promote health and wellness. Talk with your health care provider about what schedule of regular examinations is right for you. This is a good chance for you to check in with your provider about disease prevention and staying healthy. In between checkups, there are plenty of things you can do on your own. Experts have done a lot of research about which lifestyle changes and preventive measures are most likely to keep you healthy. Ask your health care provider for more information. WEIGHT AND DIET  Eat a healthy diet  Be sure to include plenty of vegetables, fruits, low-fat dairy products, and lean protein.  Do not eat a lot of foods high in solid fats, added sugars, or salt.  Get regular exercise. This is one of the most important things you can do for your health.  Most adults should exercise for at least 150 minutes each week. The exercise should increase your heart rate and make you sweat (moderate-intensity  exercise).  Most adults should also do strengthening exercises at least twice a week. This is in addition to the moderate-intensity exercise.  Maintain a healthy weight  Body mass index (BMI) is a measurement that can be used to identify possible weight problems. It estimates body fat based on height and weight. Your health care provider can help determine your BMI and help you achieve or maintain a healthy weight.  For females 69 years of age and older:   A BMI below 18.5 is considered underweight.  A BMI of 18.5 to 24.9 is normal.  A BMI of 25 to 29.9 is considered overweight.  A BMI of 30 and above is considered obese.  Watch levels of cholesterol and blood lipids  You should start having your blood tested for lipids and cholesterol at 48 years of age, then have this test every 5 years.  You may need to have your cholesterol levels checked more often if:  Your lipid or cholesterol levels are high.  You are older than 48 years of age.  You are at high risk for heart disease.  CANCER SCREENING   Lung Cancer  Lung cancer screening is recommended for adults 60-29 years old who are at high risk for lung cancer because of a history of smoking.  A yearly low-dose CT scan of the lungs is recommended for people who:  Currently  smoke.  Have quit within the past 15 years.  Have at least a 30-pack-year history of smoking. A pack year is smoking an average of one pack of cigarettes a day for 1 year.  Yearly screening should continue until it has been 15 years since you quit.  Yearly screening should stop if you develop a health problem that would prevent you from having lung cancer treatment.  Breast Cancer  Practice breast self-awareness. This means understanding how your breasts normally appear and feel.  It also means doing regular breast self-exams. Let your health care provider know about any changes, no matter how small.  If you are in your 20s or 30s, you should  have a clinical breast exam (CBE) by a health care provider every 1-3 years as part of a regular health exam.  If you are 68 or older, have a CBE every year. Also consider having a breast X-ray (mammogram) every year.  If you have a family history of breast cancer, talk to your health care provider about genetic screening.  If you are at high risk for breast cancer, talk to your health care provider about having an MRI and a mammogram every year.  Breast cancer gene (BRCA) assessment is recommended for women who have family members with BRCA-related cancers. BRCA-related cancers include:  Breast.  Ovarian.  Tubal.  Peritoneal cancers.  Results of the assessment will determine the need for genetic counseling and BRCA1 and BRCA2 testing. Cervical Cancer Routine pelvic examinations to screen for cervical cancer are no longer recommended for nonpregnant women who are considered low risk for cancer of the pelvic organs (ovaries, uterus, and vagina) and who do not have symptoms. A pelvic examination may be necessary if you have symptoms including those associated with pelvic infections. Ask your health care provider if a screening pelvic exam is right for you.   The Pap test is the screening test for cervical cancer for women who are considered at risk.  If you had a hysterectomy for a problem that was not cancer or a condition that could lead to cancer, then you no longer need Pap tests.  If you are older than 65 years, and you have had normal Pap tests for the past 10 years, you no longer need to have Pap tests.  If you have had past treatment for cervical cancer or a condition that could lead to cancer, you need Pap tests and screening for cancer for at least 20 years after your treatment.  If you no longer get a Pap test, assess your risk factors if they change (such as having a new sexual partner). This can affect whether you should start being screened again.  Some women have medical  problems that increase their chance of getting cervical cancer. If this is the case for you, your health care provider may recommend more frequent screening and Pap tests.  The human papillomavirus (HPV) test is another test that may be used for cervical cancer screening. The HPV test looks for the virus that can cause cell changes in the cervix. The cells collected during the Pap test can be tested for HPV.  The HPV test can be used to screen women 24 years of age and older. Getting tested for HPV can extend the interval between normal Pap tests from three to five years.  An HPV test also should be used to screen women of any age who have unclear Pap test results.  After 48 years of age, women should  have HPV testing as often as Pap tests.  Colorectal Cancer  This type of cancer can be detected and often prevented.  Routine colorectal cancer screening usually begins at 48 years of age and continues through 48 years of age.  Your health care provider may recommend screening at an earlier age if you have risk factors for colon cancer.  Your health care provider may also recommend using home test kits to check for hidden blood in the stool.  A small camera at the end of a tube can be used to examine your colon directly (sigmoidoscopy or colonoscopy). This is done to check for the earliest forms of colorectal cancer.  Routine screening usually begins at age 7.  Direct examination of the colon should be repeated every 5-10 years through 48 years of age. However, you may need to be screened more often if early forms of precancerous polyps or small growths are found. Skin Cancer  Check your skin from head to toe regularly.  Tell your health care provider about any new moles or changes in moles, especially if there is a change in a mole's shape or color.  Also tell your health care provider if you have a mole that is larger than the size of a pencil eraser.  Always use sunscreen. Apply  sunscreen liberally and repeatedly throughout the day.  Protect yourself by wearing long sleeves, pants, a wide-brimmed hat, and sunglasses whenever you are outside. HEART DISEASE, DIABETES, AND HIGH BLOOD PRESSURE   Have your blood pressure checked at least every 1-2 years. High blood pressure causes heart disease and increases the risk of stroke.  If you are between 21 years and 36 years old, ask your health care provider if you should take aspirin to prevent strokes.  Have regular diabetes screenings. This involves taking a blood sample to check your fasting blood sugar level.  If you are at a normal weight and have a low risk for diabetes, have this test once every three years after 48 years of age.  If you are overweight and have a high risk for diabetes, consider being tested at a younger age or more often. PREVENTING INFECTION  Hepatitis B  If you have a higher risk for hepatitis B, you should be screened for this virus. You are considered at high risk for hepatitis B if:  You were born in a country where hepatitis B is common. Ask your health care provider which countries are considered high risk.  Your parents were born in a high-risk country, and you have not been immunized against hepatitis B (hepatitis B vaccine).  You have HIV or AIDS.  You use needles to inject street drugs.  You live with someone who has hepatitis B.  You have had sex with someone who has hepatitis B.  You get hemodialysis treatment.  You take certain medicines for conditions, including cancer, organ transplantation, and autoimmune conditions. Hepatitis C  Blood testing is recommended for:  Everyone born from [birth year] through 1964.  Anyone with known risk factors for hepatitis C. Sexually transmitted infections (STIs)  You should be screened for sexually transmitted infections (STIs) including gonorrhea and chlamydia if:  You are sexually active and are younger than 48 years of age.  You are  older than 48 years of age and your health care provider tells you that you are at risk for this type of infection.  Your sexual activity has changed since you were last screened and you are at an increased risk  for chlamydia or gonorrhea. Ask your health care provider if you are at risk.  If you do not have HIV, but are at risk, it may be recommended that you take a prescription medicine daily to prevent HIV infection. This is called pre-exposure prophylaxis (PrEP). You are considered at risk if:  You are sexually active and do not regularly use condoms or know the HIV status of your partner(s).  You take drugs by injection.  You are sexually active with a partner who has HIV. Talk with your health care provider about whether you are at high risk of being infected with HIV. If you choose to begin PrEP, you should first be tested for HIV. You should then be tested every 3 months for as long as you are taking PrEP.  PREGNANCY   If you are premenopausal and you may become pregnant, ask your health care provider about preconception counseling.  If you may become pregnant, take 400 to 800 micrograms (mcg) of folic acid every day.  If you want to prevent pregnancy, talk to your health care provider about birth control (contraception). OSTEOPOROSIS AND MENOPAUSE   Osteoporosis is a disease in which the bones lose minerals and strength with aging. This can result in serious bone fractures. Your risk for osteoporosis can be identified using a bone density scan.  If you are 1 years of age or older, or if you are at risk for osteoporosis and fractures, ask your health care provider if you should be screened.  Ask your health care provider whether you should take a calcium or vitamin D supplement to lower your risk for osteoporosis.  Menopause may have certain physical symptoms and risks.  Hormone replacement therapy may reduce some of these symptoms and risks. Talk to your health care provider  about whether hormone replacement therapy is right for you.  HOME CARE INSTRUCTIONS   Schedule regular health, dental, and eye exams.  Stay current with your immunizations.   Do not use any tobacco products including cigarettes, chewing tobacco, or electronic cigarettes.  If you are pregnant, do not drink alcohol.  If you are breastfeeding, limit how much and how often you drink alcohol.  Limit alcohol intake to no more than 1 drink per day for nonpregnant women. One drink equals 12 ounces of beer, 5 ounces of wine, or 1 ounces of hard liquor.  Do not use street drugs.  Do not share needles.  Ask your health care provider for help if you need support or information about quitting drugs.  Tell your health care provider if you often feel depressed.  Tell your health care provider if you have ever been abused or do not feel safe at home. Document Released: 03/23/2011 Document Revised: 01/22/2014 Document Reviewed: 08/09/2013 [facility] Patient Information 2015 [place], [place]. This information is not intended to replace advice given to you by your health care provider. Make sure you discuss any questions you have with your health care provider.

## 2015-06-20 NOTE — Progress Notes (Signed)
Subjective:    Patient ID: Crystal Williamson, female    DOB: 1966-01-07, 49 y.o.   MRN: [id number] Chief Complaint  Patient presents with  . CPE    No pelvic  . Medication Refill    Oracea and Flexeril    HPI     Crystal Williamson is a 49 y.o. very pleasant female patient who presents with the following:  She is here today for a CPE.  She is doing well.    She has a history of hysterectomy, and sees Dr. Toney Rakes for her GYN care.   She is UTD on her mammogram  She has rosacea, which is under pretty good control per her derm in [place].    Has DDD her whole life.  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  Her husband has been diagnosed with brain cancer this yr and he has had 2 surgeries but he is overall doing really well. However, with this she gained back the weight she had lost in the year prior as they have been eating more comfort foods and exercising less.  She is still active at work.  She would like to turn this around and loose the weight again.  Filed Weights   06/20/15 1336  Weight: 230 lb 9.6 oz (104.599 kg)   Wt Readings from Last 3 Encounters:  11/15/14 218 lb (98.884 kg)  01/29/14 214 lb 3.2 oz (97.16 kg)  11/14/13 227 lb (102.967 kg)    She had a Tdap in 2010.    Depression screen PHQ 2/9 06/20/2015  Decreased Interest 0  Down, Depressed, Hopeless 0  PHQ - 2 Score 0   Past Medical History  Diagnosis Date  . Fibroid   . Headache(784.0)   . No pertinent past medical history   . Varicose veins     bilateral   Past Surgical History  Procedure Laterality Date  . Septoplasty    . Wisdom tooth extraction    . Abdominal hysterectomy  06/01/2012    Procedure: HYSTERECTOMY ABDOMINAL;  Surgeon: Terrance Mass, MD;  Location: [facility];  Service: Gynecology;  Laterality: N/A;  . Varicose vein surgery     Current Outpatient Prescriptions on File Prior to Visit  Medication Sig Dispense Refill   . CA & PHOS-VIT D-MAG PO Take 3 tablets by mouth at bedtime.    Marland Kitchen ELESTRIN 0.52 MG/0.87 GM (0.06%) GEL apply topically to ONE ARM AS DIRECTED DAILY 52 g 10  . Milk Thistle 200 MG CAPS Take 200-400 mg by mouth daily.    . Probiotic Product (PROBIOTIC DAILY PO) Take 1 capsule by mouth.    . metroNIDAZOLE (METROGEL) 1 % gel Apply topically daily.    Marland Kitchen OVER THE COUNTER MEDICATION      No current facility-administered medications on file prior to visit.   Allergies  Allergen Reactions  . Codeine Itching    REACTION: N \\T \ V  . Hydromorphone Itching  . Opium   . Percocet [Oxycodone-Acetaminophen] Itching   Family History  Problem Relation Age of Onset  . Colon cancer Paternal Grandfather   . Diabetes Paternal Grandfather     type 2   . Cancer Paternal Grandfather     COLON  . Breast cancer Paternal Grandmother   . Stroke Maternal Grandmother   . Lupus Mother   . Lumbar disc disease Mother   . Cancer Maternal Grandfather   .  Diabetes Maternal Grandfather    Social History   Social History  . Marital Status: Married    Spouse Name: N/A  . Number of Children: N/A  . Years of Education: N/A   Social History Main Topics  . Smoking status: Former Smoker -- 0.20 packs/day    Types: Cigarettes  . Smokeless tobacco: Former Systems developer    Quit date: 12/29/2012     Comment: 5 cig a day  . Alcohol Use: 0.0 oz/week    0 Standard drinks or equivalent per week     Comment: 3-4x week   . Drug Use: No  . Sexual Activity: Not Currently    Birth Control/ Protection: None, Surgical   Other Topics Concern  . None   Social History Narrative   ** Merged History Encounter **          Review of Systems  Constitutional: Positive for activity change and fatigue. Negative for fever, chills, diaphoresis, appetite change and unexpected weight change.  Gastrointestinal: Negative for nausea, vomiting and abdominal pain.  Musculoskeletal: Positive for myalgias, back pain and arthralgias. Negative  for joint swelling and gait problem.  Psychiatric/Behavioral: Positive for sleep disturbance. Negative for dysphoric mood. The patient is not nervous/anxious.   All other systems reviewed and are negative.      Objective:  Physical Exam  Constitutional: She is oriented to person, place, and time. She appears well-developed and well-nourished. No distress.  HENT:  Head: Normocephalic and atraumatic.  Right Ear: Tympanic membrane, external ear and ear canal normal.  Left Ear: Tympanic membrane, external ear and ear canal normal.  Nose: Nose normal. No mucosal edema or rhinorrhea.  Mouth/Throat: Uvula is midline, oropharynx is clear and moist and mucous membranes are normal. No posterior oropharyngeal erythema.  Eyes: Conjunctivae and EOM are normal. Pupils are equal, round, and reactive to light. Right eye exhibits no discharge. Left eye exhibits no discharge. No scleral icterus.  Neck: Normal range of motion. Neck supple. No thyromegaly present.  Cardiovascular: Normal rate, regular rhythm, normal heart sounds and intact distal pulses.   Pulmonary/Chest: Effort normal and breath sounds normal. No respiratory distress.  Abdominal: Soft. Bowel sounds are normal. There is no tenderness.  Musculoskeletal: She exhibits no edema.  Lymphadenopathy:    She has no cervical adenopathy.  Neurological: She is alert and oriented to person, place, and time. She has normal reflexes.  Skin: Skin is warm and dry. She is not diaphoretic. No erythema.  Psychiatric: She has a normal mood and affect. Her behavior is normal.      BP 116/81 mmHg  Pulse 84  Temp(Src) 98.5 F (36.9 C) (Oral)  Resp 16  Ht 5\' 8"  (1.727 m)  Wt 230 lb 9.6 oz (104.599 kg)  BMI 35.07 kg/m2  SpO2 97%  LMP 02/08/2012     Assessment & Plan:  Pt requests that lab results be mailed or called - she does not use mychart even though she did sign up for it and so it is active.  1. Annual physical exam - seeing gyn Dr. Darnelle Going  for well-woman care, nml mammo in Feb of this yr.  2. Elevated glucose - a1c improved from prior despite weight gain - pt encouraged.  3. Elevated ALT measurement - recheck, now normal, pt reassured - RUQ [place] last yr showed likely hepatic steatosis  4. Obesity - worse with working 3rd shift long term, husband diagnosed with brain cancer in past yr so life has changed - eating more, exercising  less as he is going through trx but overall doing well  5. Screening for STD (sexually transmitted disease)   6. Screening for thyroid disorder   7. Screening for cardiovascular, respiratory, and genitourinary diseases   8. Screening for deficiency anemia   9. Encounter for vitamin deficiency screening   10.     Chronic MSK low back pain - from heavy lifting occ at work, refilled prn flexeril 11.    Rosacea - refilled doxy, helping a lot.  Orders Placed This Encounter  Procedures  . Comprehensive metabolic panel    Order Specific Question:  Has the patient fasted?    Answer:  Yes  . Lipid panel    Order Specific Question:  Has the patient fasted?    Answer:  Yes  . CBC  . TSH  . POCT glycosylated hemoglobin (Hb A1C)  . POCT urinalysis dipstick    Meds ordered this encounter  Medications  . doxycycline (ORACEA) 40 MG capsule    Sig: Take 1 capsule (40 mg total) by mouth every morning.    Dispense:  90 capsule    Refill:  3  . cyclobenzaprine (FLEXERIL) 5 MG tablet    Sig: Take 1 tablet (5 mg total) by mouth 3 (three) times daily as needed.    Dispense:  30 tablet    Refill:  1    Please leave on file to fill as pt needs    [name], MD MPH  Results for orders placed or performed in visit on 06/20/15  Comprehensive metabolic panel  Result Value Ref Range   Sodium 137 135 - 146 mmol/L   Potassium 4.2 3.5 - 5.3 mmol/L   Chloride 104 98 - 110 mmol/L   CO2 26 20 - 31 mmol/L   Glucose, Bld 112 (H) 65 - 99 mg/dL   BUN 13 7 - 25 mg/dL   Creat 0.58 0.50 - 1.10 mg/dL   Total Bilirubin 0.7  0.2 - 1.2 mg/dL   Alkaline Phosphatase 65 33 - 115 U/L   AST 21 10 - 35 U/L   ALT 28 6 - 29 U/L   Total Protein 6.9 6.1 - 8.1 g/dL   Albumin 4.4 3.6 - 5.1 g/dL   Calcium 9.4 8.6 - 10.2 mg/dL  Lipid panel  Result Value Ref Range   Cholesterol 180 125 - 200 mg/dL   Triglycerides 48 <150 mg/dL   HDL 87 >=46 mg/dL   Total CHOL/HDL Ratio 2.1 <=5.0 Ratio   VLDL 10 <30 mg/dL   LDL Cholesterol 83 <130 mg/dL  CBC  Result Value Ref Range   WBC 7.2 4.0 - 10.5 K/uL   RBC 4.60 3.87 - 5.11 MIL/uL   Hemoglobin 14.6 12.0 - 15.0 g/dL   HCT 43.2 36.0 - 46.0 %   MCV 93.9 78.0 - 100.0 fL   MCH 31.7 26.0 - 34.0 pg   MCHC 33.8 30.0 - 36.0 g/dL   RDW 13.7 11.5 - 15.5 %   Platelets 231 150 - 400 K/uL   MPV 11.4 8.6 - 12.4 fL  TSH  Result Value Ref Range   TSH 1.094 0.350 - 4.500 uIU/mL  POCT glycosylated hemoglobin (Hb A1C)  Result Value Ref Range   Hemoglobin A1C 5.3   POCT urinalysis dipstick  Result Value Ref Range   Color, UA yellow yellow   Clarity, UA clear clear   Glucose, UA negative negative   Bilirubin, UA negative negative   Ketones, POC UA negative negative  Spec Grav, UA 1.010    Blood, UA negative negative   pH, UA 7.0    Protein Ur, POC negative negative   Urobilinogen, UA 0.2    Nitrite, UA Negative Negative   Leukocytes, UA Negative Negative

## 2015-06-21 ENCOUNTER — Encounter: Payer: Commercial Managed Care - PPO | Admitting: Family Medicine

## 2015-06-21 LAB — TSH: TSH: 1.094 u[IU]/mL (ref 0.350–4.500)

## 2015-06-25 ENCOUNTER — Encounter: Payer: Self-pay | Admitting: Family Medicine

## 2015-06-26 LAB — HEMOGLOBIN A1C
Hgb A1c MFr Bld: 5.8 % — ABNORMAL HIGH (ref <5.7)
Mean Plasma Glucose: 120 mg/dL — ABNORMAL HIGH (ref <117)

## 2015-07-10 ENCOUNTER — Encounter: Payer: Self-pay | Admitting: Surgery

## 2015-07-15 ENCOUNTER — Ambulatory Visit (HOSPITAL_COMMUNITY)
Admission: RE | Admit: 2015-07-15 | Discharge: 2015-07-15 | Disposition: A | Discharge status: Home / Self Care | Payer: BLUE CROSS/BLUE SHIELD | Source: Ambulatory Visit | Attending: Surgery | Admitting: Surgery

## 2015-07-15 ENCOUNTER — Ambulatory Visit (INDEPENDENT_AMBULATORY_CARE_PROVIDER_SITE_OTHER): Payer: BLUE CROSS/BLUE SHIELD | Admitting: Surgery

## 2015-07-15 ENCOUNTER — Encounter: Payer: Self-pay | Admitting: Surgery

## 2015-07-15 VITALS — BP 125/75 | HR 88 | Ht 68.0 in | Wt 229.3 lb

## 2015-07-15 DIAGNOSIS — I8390 Asymptomatic varicose veins of unspecified lower extremity: Secondary | ICD-10-CM

## 2015-07-15 DIAGNOSIS — I8393 Asymptomatic varicose veins of bilateral lower extremities: Secondary | ICD-10-CM | POA: Insufficient documentation

## 2015-07-15 DIAGNOSIS — I868 Varicose veins of other specified sites: Secondary | ICD-10-CM

## 2015-07-15 DIAGNOSIS — I872 Venous insufficiency (chronic) (peripheral): Secondary | ICD-10-CM

## 2015-07-15 NOTE — Progress Notes (Signed)
Referred by:  Darreld Mclean, MD [address] [place], [place] [address]   History of Present Illness  Crystal Williamson is a 49 y.o. (May 04, 1966) female who presents with chief complaint: bilateral leg swelling, right worse than left.  Patient notes, onset of swelling "several months ago."  She was previously a patient a [facility] but has recently switched practices due to insurance issues. She underwent laser ablation of the right great saphenous vein last winter and "injections" to her left leg. She experienced great improvement in her symptoms following these procedures. However, in the past several months, she has described increasing heaviness, burning and aching. Her symptoms are worse at the end of the day. She wears thigh high compression stockings daily but has not replaced them since last winter.  She denies a history of DVT, skin changes or ulceration. There is a family history of venous disorders.   She has no significant past medical history. She is a current smoker.   Past Medical History  Diagnosis Date  . Fibroid   . Headache(784.0)   . No pertinent past medical history   . Varicose veins     bilateral    Past Surgical History  Procedure Laterality Date  . Septoplasty    . Wisdom tooth extraction    . Abdominal hysterectomy  06/01/2012    Procedure: HYSTERECTOMY ABDOMINAL;  Surgeon: Terrance Mass, MD;  Location: [facility];  Service: Gynecology;  Laterality: N/A;  . Varicose vein surgery      Social History   Social History  . Marital Status: Married    Spouse Name: N/A  . Number of Children: N/A  . Years of Education: N/A   Occupational History  . Not on file.   Social History Main Topics  . Smoking status: Current Every Day Smoker -- 0.25 packs/day    Types: Cigarettes  . Smokeless tobacco: Not on file     Comment: 5 cig a day  . Alcohol Use: No     Comment: 3-4x week   . Drug Use: No  . Sexual Activity: Not Currently    Birth Control/  Protection: None, Surgical   Other Topics Concern  . Not on file   Social History Narrative   ** Merged History Encounter **        Family History  Problem Relation Age of Onset  . Colon cancer Paternal Grandfather   . Diabetes Paternal Grandfather     type 2   . Cancer Paternal Grandfather     COLON  . Breast cancer Paternal Grandmother   . Stroke Maternal Grandmother   . Lupus Mother   . Lumbar disc disease Mother   . Varicose Veins Mother   . Cancer Maternal Grandfather   . Diabetes Maternal Grandfather     Current Outpatient Prescriptions on File Prior to Visit  Medication Sig Dispense Refill  . cyclobenzaprine (FLEXERIL) 5 MG tablet Take 1 tablet (5 mg total) by mouth 3 (three) times daily as needed. 30 tablet 1  . doxycycline (ORACEA) 40 MG capsule Take 1 capsule (40 mg total) by mouth every morning. 90 capsule 3  . ELESTRIN 0.52 MG/0.87 GM (0.06%) GEL apply topically to ONE ARM AS DIRECTED DAILY 52 g 10  . metroNIDAZOLE (METROGEL) 1 % gel Apply topically daily.    Marland Kitchen OVER THE COUNTER MEDICATION     . Probiotic Product (PROBIOTIC DAILY PO) Take 1 capsule by mouth.    . CA & PHOS-VIT  D-MAG PO Take 3 tablets by mouth at bedtime.    . Milk Thistle 200 MG CAPS Take 200-400 mg by mouth daily.     No current facility-administered medications on file prior to visit.    Allergies  Allergen Reactions  . Codeine Itching    REACTION: N \\T \ V  . Hydromorphone Itching  . Opium   . Percocet [Oxycodone-Acetaminophen] Itching    REVIEW OF SYSTEMS:  (Positives checked otherwise negative)  CARDIOVASCULAR:  []  chest pain, []  chest pressure, []  palpitations, []  shortness of breath when laying flat, []  shortness of breath with exertion,  []  pain in feet when walking, []  pain in feet when laying flat, []  history of blood clot in veins (DVT), []  history of phlebitis, [x]  swelling in legs, [x]  varicose veins  PULMONARY:  []  productive cough, []  asthma, []  wheezing  NEUROLOGIC:  []   weakness in arms or legs, []  numbness in arms or legs, []  difficulty speaking or slurred speech, []  temporary loss of vision in one eye, []  dizziness  HEMATOLOGIC:  []  bleeding problems, []  problems with blood clotting too easily  MUSCULOSKEL:  []  joint pain, []  joint swelling  GASTROINTEST:  []  vomiting blood, []  blood in stool     GENITOURINARY:  []  burning with urination, []  blood in urine  PSYCHIATRIC:  []  history of major depression  INTEGUMENTARY:  []  rashes, []  ulcers  CONSTITUTIONAL:  []  fever, []  chills   Physical Examination Filed Vitals:   07/15/15 1406  BP: 125/75  Pulse: 88  Height: 5\' 8"  (1.727 m)  Weight: 229 lb 4.8 oz (104.01 kg)  SpO2: 99%   Body mass index is 34.87 kg/(m^2).  General: A&O x 3, WDWN obese female in NAD  Head: [place]/AT  Pulmonary: Sym exp, good air movt, CTAB, no rales, rhonchi, & wheezing  Cardiac: RRR, Nl S1, S2, no Murmurs, rubs or gallops  Vascular: Palpable radial pulses bilaterally, palpable dorsalis pedis pulses bilaterally, no swelling of lower extremities, no venous stasis changes, no lower extremity wounds, no spider veins or varicosities seen.   Musculoskeletal: Extremities without ischemic changes.   Neurologic: No focal deficits   Psychiatric: Judgment intact, Mood & affect appropriate for pt's clinical situation  Dermatologic: See M/S exam for extremity exam, no rashes otherwise noted  Non-Invasive Vascular Imaging  BLE Venous Insufficiency Duplex (Date: 07/15/2015):   RLE: negative for DVT, GSV ablated, small 0.3 cm perforator distally, deep venous reflux present  LLE: negative for DVT, no superficial or deep venous reflux present.   Medical Decision Making  Crystal Williamson is a 49 y.o. female who presents with right lower extremity chronic venous insufficiency.    The patient has previously undergone successful venous ablation of the right great saphenous vein. Discussed that there are no other procedures  available to treat her leg.   Explained that her right leg symptoms may be related to her deep venous reflux.  Recommended use of compression stockings and elevation for symptom relief. Recommended replacing her stockings every six months for optimum effectiveness.   She will follow up on an as needed basis.   Virgina Jock, PA-C [facility] Office: [phone] Pager: [phone]  07/15/2015, 3:37 PM  This patient was seen and examined in conjunction with Dr. Trula Slade   I agree with the above.  I have seen and evaluated the patient.  She has previously undergone venous ablation of the right great saphenous vein at [facility].  They no longer  take her insurance and therefore she comes in today with questions regarding leg swelling and some heaviness.  We repeated her ultrasound.  Her saphenous vein remains closed she does have deep vein reflux in the right common femoral vein.  The left leg was a normal study.  I discussed with the patient that at this point she needs leg elevation and compression stockings.  There are no surgical treatments.  We will try to facilitate getting her compression stockings.  She will follow up on an as-needed basis  Wells Brabham

## 2015-10-01 ENCOUNTER — Encounter: Payer: BLUE CROSS/BLUE SHIELD | Admitting: Podiatry

## 2015-10-01 ENCOUNTER — Ambulatory Visit: Payer: Self-pay

## 2015-10-01 DIAGNOSIS — M722 Plantar fascial fibromatosis: Secondary | ICD-10-CM

## 2015-10-11 ENCOUNTER — Encounter: Payer: Self-pay | Admitting: Family Medicine

## 2015-10-15 NOTE — Progress Notes (Signed)
This encounter was created in error - please disregard.

## 2015-10-16 ENCOUNTER — Encounter: Payer: Self-pay | Admitting: Family Medicine

## 2015-10-22 ENCOUNTER — Ambulatory Visit (INDEPENDENT_AMBULATORY_CARE_PROVIDER_SITE_OTHER): Payer: BLUE CROSS/BLUE SHIELD

## 2015-10-22 ENCOUNTER — Encounter: Payer: Self-pay | Admitting: Podiatry

## 2015-10-22 ENCOUNTER — Ambulatory Visit (INDEPENDENT_AMBULATORY_CARE_PROVIDER_SITE_OTHER): Payer: BLUE CROSS/BLUE SHIELD | Admitting: Podiatry

## 2015-10-22 VITALS — BP 119/78 | HR 86 | Resp 12

## 2015-10-22 DIAGNOSIS — M79673 Pain in unspecified foot: Secondary | ICD-10-CM | POA: Diagnosis not present

## 2015-10-22 DIAGNOSIS — M722 Plantar fascial fibromatosis: Secondary | ICD-10-CM | POA: Diagnosis not present

## 2015-10-22 NOTE — Progress Notes (Signed)
   Subjective:    Patient ID: Crystal Williamson, female    DOB: Nov 07, 1965, 50 y.o.   MRN: [id number]  HPI: She presents today requesting new orthotics. She states that she has had orthotics in the past and with her flatfeet do seem to do a good job relieving her symptoms in her feet as well as in her legs.    Review of Systems  Musculoskeletal: Positive for back pain.       Objective:   Physical Exam: Vital signs are stable she is alert and oriented 3. Pulses are strongly palpable. Neurologic sensorium is intact per Semmes-Weinstein monofilament. Deep tendon reflexes are intact bilateral and muscle strength +5 over 5 dorsiflexion plantar flexors and inverters everters all intrinsic musculature is intact. Orthopedic evaluation demonstrates flexible pes planus bilateral radiographs confirm Leksell pes planus bilateral      Assessment & Plan:  Assessment: Pes planus bilateral with mild plantar fasciitis.  Plan: She was 27 orthotics.

## 2015-11-12 ENCOUNTER — Ambulatory Visit: Payer: BLUE CROSS/BLUE SHIELD | Admitting: *Deleted

## 2015-11-12 DIAGNOSIS — M722 Plantar fascial fibromatosis: Secondary | ICD-10-CM

## 2015-11-12 NOTE — Patient Instructions (Signed)

## 2015-11-12 NOTE — Progress Notes (Signed)
Patient ID: Crystal Williamson, female   DOB: 1966/01/03, 50 y.o.   MRN: [id number] Patient presents for orthotic pick up.  Verbal and written break in and wear instructions given.  Patient will follow up in 4 weeks if symptoms worsen or fail to improve.

## 2015-12-10 ENCOUNTER — Ambulatory Visit: Payer: Self-pay | Admitting: *Deleted

## 2015-12-10 DIAGNOSIS — M79673 Pain in unspecified foot: Secondary | ICD-10-CM

## 2015-12-10 NOTE — Progress Notes (Signed)
Patient ID: Crystal Williamson, female   DOB: Apr 05, 1966, 50 y.o.   MRN: [id number] Patient presents for pick up of adjusted orthotics. Patient states that they still don't feel right.  Patient is comparing custom orthotics to store orthotics, will send back for additional adjustments. If these adjustments are not correct we will plaster cast patient and completely remake.

## 2015-12-10 NOTE — Patient Instructions (Signed)

## 2016-01-02 ENCOUNTER — Other Ambulatory Visit: Payer: Self-pay

## 2016-01-02 ENCOUNTER — Telehealth: Payer: Self-pay | Admitting: *Deleted

## 2016-01-02 ENCOUNTER — Other Ambulatory Visit: Payer: Self-pay | Admitting: *Deleted

## 2016-01-02 DIAGNOSIS — Z79890 Hormone replacement therapy (postmenopausal): Secondary | ICD-10-CM

## 2016-01-02 DIAGNOSIS — Z1231 Encounter for screening mammogram for malignant neoplasm of breast: Secondary | ICD-10-CM

## 2016-01-02 NOTE — Telephone Encounter (Signed)
ORDER PLACED

## 2016-01-02 NOTE — Telephone Encounter (Signed)
-----   Message from Terrance Mass, MD sent at 01/02/2016 12:39 PM EDT ----- Regarding: RE: nurse call Year she will need a bone density this year she is 50 years old menopausal on hormone replacement therapy ----- Message -----    From: Thamas Jaegers, RMA    Sent: 01/02/2016  12:31 PM      To: Terrance Mass, MD Subject: FW: nurse call                                 Dr.Fernandez please see the below. ----- Message -----    From: Sinclair Grooms    Sent: 01/02/2016  12:20 PM      To: Thamas Jaegers, RMA Subject: nurse call                                     Patient states that JF wanted to do bone density this year but there in no order or mention of it in his notes from last year. Can you please ask him if he wants to do bone density in 2017. She is scheduled for physical on May 25 with him. Thx

## 2016-01-09 ENCOUNTER — Other Ambulatory Visit: Payer: Self-pay | Admitting: Gynecology

## 2016-01-09 ENCOUNTER — Ambulatory Visit (INDEPENDENT_AMBULATORY_CARE_PROVIDER_SITE_OTHER): Payer: 59

## 2016-01-09 DIAGNOSIS — Z13820 Encounter for screening for osteoporosis: Secondary | ICD-10-CM

## 2016-01-09 DIAGNOSIS — Z79890 Hormone replacement therapy (postmenopausal): Secondary | ICD-10-CM

## 2016-01-20 ENCOUNTER — Ambulatory Visit
Admission: RE | Admit: 2016-01-20 | Discharge: 2016-01-20 | Disposition: A | Discharge status: Home / Self Care | Payer: 59 | Source: Ambulatory Visit

## 2016-01-20 DIAGNOSIS — Z1231 Encounter for screening mammogram for malignant neoplasm of breast: Secondary | ICD-10-CM

## 2016-02-11 ENCOUNTER — Other Ambulatory Visit: Payer: Self-pay | Admitting: *Deleted

## 2016-02-11 MED ORDER — ESTRADIOL 0.52 MG/0.87 GM (0.06%) TD GEL: TRANSDERMAL | Status: DC

## 2016-02-11 NOTE — Telephone Encounter (Signed)
Annual scheduled on 02/13/16, Rx sent

## 2016-02-13 ENCOUNTER — Encounter: Payer: Self-pay | Admitting: Gynecology

## 2016-02-13 ENCOUNTER — Ambulatory Visit (INDEPENDENT_AMBULATORY_CARE_PROVIDER_SITE_OTHER): Payer: 59 | Admitting: Gynecology

## 2016-02-13 VITALS — BP 112/70 | Ht 68.0 in | Wt 211.0 lb

## 2016-02-13 DIAGNOSIS — Z720 Tobacco use: Secondary | ICD-10-CM | POA: Diagnosis not present

## 2016-02-13 DIAGNOSIS — Z79890 Hormone replacement therapy (postmenopausal): Secondary | ICD-10-CM

## 2016-02-13 DIAGNOSIS — Z01419 Encounter for gynecological examination (general) (routine) without abnormal findings: Secondary | ICD-10-CM | POA: Diagnosis not present

## 2016-02-13 DIAGNOSIS — F17200 Nicotine dependence, unspecified, uncomplicated: Secondary | ICD-10-CM

## 2016-02-13 NOTE — Patient Instructions (Signed)

## 2016-02-13 NOTE — Progress Notes (Signed)
Crystal Williamson [facility] 08/13/1966 [id number]   History:    50 y.o.  for annual gyn exam with no complaints today. Patient lost her husband a few months ago from brain cancer is coping well as expected.Patient with prior history of total abdominal hysterectomy with ovarian conservation secondary to a 16 week size multilobulated leiomyomatous uteri. Her pathology was benign. Patient previously has been tested with an [facility] indicated she was menopausal and she was originally on Estrace 2 mg by mouth daily for vasomotor symptoms and is now on Elestrin 0.06% daily transdermal application and has done well. Her PCP we'll be doing her blood work .Her vaccines are up-to-date. Patient prior to her hysterectomy had no history of abnormal Pap smears.  Past medical history,surgical history, family history and social history were all reviewed and documented in the EPIC chart.  Gynecologic History Patient's last menstrual period was 02/08/2012. Contraception: status post hysterectomy Last Pap: 2010. Results were: normal Last mammogram: 2017. Results were: normal  Obstetric History OB History  Gravida Para Term Preterm AB SAB TAB Ectopic Multiple Living  2 0 0 0 1 1 0 0 0 0     # Outcome Date GA Lbr Len/2nd Weight Sex Delivery Anes PTL Lv  2 SAB           1 Gravida                ROS: A ROS was performed and pertinent positives and negatives are included in the history.  GENERAL: No fevers or chills. HEENT: No change in vision, no earache, sore throat or sinus congestion. NECK: No pain or stiffness. CARDIOVASCULAR: No chest pain or pressure. No palpitations. PULMONARY: No shortness of breath, cough or wheeze. GASTROINTESTINAL: No abdominal pain, nausea, vomiting or diarrhea, melena or bright red blood per rectum. GENITOURINARY: No urinary frequency, urgency, hesitancy or dysuria. MUSCULOSKELETAL: No joint or muscle pain, no back pain, no recent trauma. DERMATOLOGIC: No rash, no itching, no lesions.  ENDOCRINE: No polyuria, polydipsia, no heat or cold intolerance. No recent change in weight. HEMATOLOGICAL: No anemia or easy bruising or bleeding. NEUROLOGIC: No headache, seizures, numbness, tingling or weakness. PSYCHIATRIC: No depression, no loss of interest in normal activity or change in sleep pattern.     Exam: chaperone present  BP 112/70 mmHg  Ht 5\' 8"  (1.727 m)  Wt 211 lb (95.709 kg)  BMI 32.09 kg/m2  LMP 02/08/2012  Body mass index is 32.09 kg/(m^2).  General appearance : Well developed well nourished female. No acute distress HEENT: Eyes: no retinal hemorrhage or exudates,  Neck supple, trachea midline, no carotid bruits, no thyroidmegaly Lungs: Clear to auscultation, no rhonchi or wheezes, or rib retractions  Heart: Regular rate and rhythm, no murmurs or gallops Breast:Examined in sitting and supine position were symmetrical in appearance, no palpable masses or tenderness,  no skin retraction, no nipple inversion, no nipple discharge, no skin discoloration, no axillary or supraclavicular lymphadenopathy Abdomen: no palpable masses or tenderness, no rebound or guarding Extremities: no edema or skin discoloration or tenderness  Pelvic:  Bartholin, Urethra, Skene Glands: Within normal limits             Vagina: No gross lesions or discharge  Cervix: Absent  Uterus absent  Adnexa  Without masses or tenderness  Anus and perineum  normal   Rectovaginal  normal sphincter tone without palpated masses or tenderness             Hemoccult not indicated  Assessment/Plan:  50 y.o. female for annual exam doing well on her elestrin transdermal cream that she applies only one on daily. Patient taking her calcium and vitamin D for osteoporosis prevention as instructed. She was counseled once again on the detrimental effects of smoking potential risk of DVT and pulmonary embolism. She smokes 5 cigarettes per day but just recently since the death of her husband due to her stress. Pap  smear not indicated today.   Terrance Mass MD, 1:21 PM 02/13/2016

## 2016-03-17 ENCOUNTER — Other Ambulatory Visit (HOSPITAL_COMMUNITY): Payer: 59

## 2016-03-19 ENCOUNTER — Other Ambulatory Visit (HOSPITAL_COMMUNITY): Payer: 59

## 2016-04-20 ENCOUNTER — Telehealth: Payer: Self-pay | Admitting: *Deleted

## 2016-04-20 NOTE — Telephone Encounter (Signed)
Minivelle transdermal twice a week patch: 0.1 mg twice a week   # 8 patches with 11 refills

## 2016-04-20 NOTE — Telephone Encounter (Signed)
Pt called c/o elestrin 0.06% is too expensive asked if other options? Please advise

## 2016-04-21 MED ORDER — ESTRADIOL 1.53 MG/SPRAY TD SOLN: 1.0000 | Freq: Every day | TRANSDERMAL | 11 refills | Status: DC

## 2016-04-21 NOTE — Telephone Encounter (Signed)
Pt informed with the below note. Rx sent.  

## 2016-04-21 NOTE — Telephone Encounter (Signed)
Left message for pt to call.

## 2016-04-21 NOTE — Telephone Encounter (Signed)
If she wants to try the Evamist and if it is covered by her insurance: 1.53 mg per actuation. Apply one actuation daily to inner surface of forearm (1 forearm only daily) if in 2 weeks she does not feel any improvement she can use two actuation daily. 3 months supply with 4 refills

## 2016-04-21 NOTE — Telephone Encounter (Signed)
Pt asked if medication called evamist? States the pharmacist spoke with her about this medication this am. She doesn't believe the patches will work for her. Please advise

## 2016-06-09 ENCOUNTER — Other Ambulatory Visit: Payer: Self-pay | Admitting: Family Medicine

## 2016-07-16 ENCOUNTER — Encounter: Payer: Self-pay | Admitting: Family Medicine

## 2016-07-16 ENCOUNTER — Ambulatory Visit (INDEPENDENT_AMBULATORY_CARE_PROVIDER_SITE_OTHER): Payer: 59 | Admitting: Family Medicine

## 2016-07-16 VITALS — BP 124/88 | HR 84 | Temp 98.5°F | Resp 16 | Ht 68.0 in | Wt 217.2 lb

## 2016-07-16 DIAGNOSIS — Z136 Encounter for screening for cardiovascular disorders: Secondary | ICD-10-CM

## 2016-07-16 DIAGNOSIS — K760 Fatty (change of) liver, not elsewhere classified: Secondary | ICD-10-CM | POA: Diagnosis not present

## 2016-07-16 DIAGNOSIS — L718 Other rosacea: Secondary | ICD-10-CM

## 2016-07-16 DIAGNOSIS — G8929 Other chronic pain: Secondary | ICD-10-CM | POA: Diagnosis not present

## 2016-07-16 DIAGNOSIS — Z1383 Encounter for screening for respiratory disorder NEC: Secondary | ICD-10-CM | POA: Diagnosis not present

## 2016-07-16 DIAGNOSIS — H10829 Rosacea conjunctivitis, unspecified eye: Secondary | ICD-10-CM

## 2016-07-16 DIAGNOSIS — Z1389 Encounter for screening for other disorder: Secondary | ICD-10-CM

## 2016-07-16 DIAGNOSIS — R7309 Other abnormal glucose: Secondary | ICD-10-CM | POA: Diagnosis not present

## 2016-07-16 DIAGNOSIS — M545 Low back pain: Secondary | ICD-10-CM | POA: Diagnosis not present

## 2016-07-16 DIAGNOSIS — L719 Rosacea, unspecified: Secondary | ICD-10-CM

## 2016-07-16 DIAGNOSIS — Z130 Encounter for screening for diseases of the blood and blood-forming organs and certain disorders involving the immune mechanism: Secondary | ICD-10-CM

## 2016-07-16 DIAGNOSIS — Z0000 Encounter for general adult medical examination without abnormal findings: Secondary | ICD-10-CM | POA: Diagnosis not present

## 2016-07-16 DIAGNOSIS — Z1329 Encounter for screening for other suspected endocrine disorder: Secondary | ICD-10-CM

## 2016-07-16 LAB — CBC
HEMATOCRIT: 41.3 % (ref 35.0–45.0)
Hemoglobin: 13.9 g/dL (ref 11.7–15.5)
MCH: 32.3 pg (ref 27.0–33.0)
MCHC: 33.7 g/dL (ref 32.0–36.0)
MCV: 96 fL (ref 80.0–100.0)
MPV: 11.3 fL (ref 7.5–12.5)
PLATELETS: 215 10*3/uL (ref 140–400)
RBC: 4.3 MIL/uL (ref 3.80–5.10)
RDW: 13.6 % (ref 11.0–15.0)
WBC: 7.7 10*3/uL (ref 3.8–10.8)

## 2016-07-16 LAB — COMPREHENSIVE METABOLIC PANEL
ALT: 26 U/L (ref 6–29)
AST: 22 U/L (ref 10–35)
Albumin: 4.6 g/dL (ref 3.6–5.1)
Alkaline Phosphatase: 69 U/L (ref 33–115)
BUN: 12 mg/dL (ref 7–25)
CALCIUM: 9.4 mg/dL (ref 8.6–10.2)
CO2: 20 mmol/L (ref 20–31)
Chloride: 105 mmol/L (ref 98–110)
Creat: 0.56 mg/dL (ref 0.50–1.10)
Glucose, Bld: 111 mg/dL — ABNORMAL HIGH (ref 65–99)
POTASSIUM: 4.1 mmol/L (ref 3.5–5.3)
Sodium: 139 mmol/L (ref 135–146)
Total Bilirubin: 0.7 mg/dL (ref 0.2–1.2)
Total Protein: 6.9 g/dL (ref 6.1–8.1)

## 2016-07-16 LAB — POCT URINALYSIS DIP (MANUAL ENTRY)
Bilirubin, UA: NEGATIVE
GLUCOSE UA: NEGATIVE
Ketones, POC UA: NEGATIVE
Leukocytes, UA: NEGATIVE
NITRITE UA: NEGATIVE
PH UA: 6.5
Protein Ur, POC: NEGATIVE
RBC UA: NEGATIVE
UROBILINOGEN UA: 0.2

## 2016-07-16 LAB — LIPID PANEL
Cholesterol: 180 mg/dL (ref 125–200)
HDL: 95 mg/dL (ref >=46)
LDL CALC: 74 mg/dL (ref <130)
TRIGLYCERIDES: 54 mg/dL (ref <150)
Total CHOL/HDL Ratio: 1.9 Ratio (ref <=5.0)
VLDL: 11 mg/dL (ref <30)

## 2016-07-16 LAB — HEMOGLOBIN A1C
HEMOGLOBIN A1C: 5.1 % (ref <5.7)
Mean Plasma Glucose: 100 mg/dL

## 2016-07-16 LAB — TSH: TSH: 1.4 mIU/L

## 2016-07-16 MED ORDER — DOXYCYCLINE 40 MG PO CPDR: 40.0000 mg | DELAYED_RELEASE_CAPSULE | Freq: Every morning | ORAL | 3 refills | Status: DC

## 2016-07-16 MED ORDER — CYCLOBENZAPRINE HCL 5 MG PO TABS: 5.0000 mg | ORAL_TABLET | Freq: Three times a day (TID) | ORAL | 3 refills | Status: DC | PRN

## 2016-07-16 NOTE — Progress Notes (Signed)
Subjective:    Patient ID: Crystal Williamson, female    DOB: [birth year], 50 y.o.   MRN: [id number] Chief Complaint  Patient presents with  . Annual Exam    need refill on flexeril     HPI     Crystal Williamson is a 50 y.o. very pleasant female patient who presents with the following:  She is here today for a CPE.  She is doing as well as can be expected as her husband passed away from brain cancer earlier this year.  When I saw Hailie last year, her husband was doing well and cancer seemed to be in remission. He was then enrolled in a study at [facility] to transplant a type of bacteria into his brain to kill the cancer (???) but while he was waiting to start, he unintentionally was taken off treatment for several mos (pharmacy didn't have med or wouldn't clear through insurance, then pharmacy fridge broke and med was ruined, etc).  By the time the study started he was much worse and did go through with the treatment but already had some mets to spine so very week and in constant pain and so d/c to SNF where he passed sev mos later. Today she brings in a bill from [facility] for her husbands care for $400 which has been turned over to collections.  He did not have life insurance. Her parents have sold their house and are moving in with her as she is financially destitute from medical bills.   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 She has a college education and has applied to numerous other jobs w/o success which is really frustrating and depressing for her.  She has a history of hysterectomy, and sees Dr. Toney Rakes for her GYN care.   She is UTD on her mammogram  She has ocular rosacea, which is under pretty good control per her derm in [place].  On doxycycline 40mg .  Has DDD her whole life.  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  Her husband has been diagnosed with brain  cancer this yr and he has had 2 surgeries but he is overall doing really well. However, with this she gained back the weight she had lost in the year prior as they have been eating more comfort foods and exercising less.  She is still active at work.  She would like to turn this around and loose the weight again.  Filed Weights   07/16/16 1517  Weight: 217 lb 3.2 oz (98.5 kg)   Wt Readings from Last 3 Encounters:  11/15/14 218 lb (98.884 kg)  01/29/14 214 lb 3.2 oz (97.16 kg)  11/14/13 227 lb (102.967 kg)    She had a Tdap in 2010.    Depression screen [facility] 2/9 07/16/2016 06/20/2015  Decreased Interest 0 0  Down, Depressed, Hopeless 0 0  PHQ - 2 Score 0 0   Past Medical History:  Diagnosis Date  . Allergy   . Depression   . Fibroid   . Headache(784.0)   . No pertinent past medical history   . Varicose veins    bilateral   Past Surgical History:  Procedure Laterality Date  . ABDOMINAL HYSTERECTOMY  06/01/2012   Procedure: HYSTERECTOMY ABDOMINAL;  Surgeon: Terrance Mass, MD;  Location: [facility];  Service: Gynecology;  Laterality: N/A;  . SEPTOPLASTY    .  VARICOSE VEIN SURGERY    . WISDOM TOOTH EXTRACTION     Current Outpatient Prescriptions on File Prior to Visit  Medication Sig Dispense Refill  . CA & PHOS-VIT D-MAG PO Take 3 tablets by mouth at bedtime.    . cyclobenzaprine (FLEXERIL) 5 MG tablet Take 1 tablet (5 mg total) by mouth 3 (three) times daily as needed. 30 tablet 1  . estradiol (EVAMIST) 1.53 MG/SPRAY transdermal spray Place 1 spray onto the skin daily. 8.1 mL 11  . metroNIDAZOLE (METROGEL) 1 % gel Apply topically daily. Reported on 02/13/2016    . Milk Thistle 200 MG CAPS Take 200-400 mg by mouth daily.    . ORACEA 40 MG capsule take 1 capsule by mouth every morning 90 capsule 0  . OVER THE COUNTER MEDICATION Reported on 02/13/2016    . Probiotic Product (PROBIOTIC DAILY PO) Take 1 capsule by mouth.     No current facility-administered medications on file prior  to visit.    Allergies  Allergen Reactions  . Codeine Itching    REACTION: N \\T \ V  . Hydromorphone Itching  . Opium   . Percocet [Oxycodone-Acetaminophen] Itching   Family History  Problem Relation Age of Onset  . Colon cancer Paternal Grandfather   . Diabetes Paternal Grandfather     type 2   . Cancer Paternal Grandfather     COLON  . Breast cancer Paternal Grandmother   . Stroke Maternal Grandmother   . Lupus Mother   . Lumbar disc disease Mother   . Varicose Veins Mother   . Cancer Maternal Grandfather   . Diabetes Maternal Grandfather    Social History   Social History  . Marital status: Widowed    Spouse name: N/A  . Number of children: N/A  . Years of education: N/A   Social History Main Topics  . Smoking status: Current Every Day Smoker    Packs/day: 0.25    Types: Cigarettes  . Smokeless tobacco: Never Used     Comment: 5 cig a day  . Alcohol use No     Comment: 3-4x week   . Drug use: No  . Sexual activity: Not Currently    Birth control/ protection: None, Surgical   Other Topics Concern  . None   Social History Narrative   ** Merged History Encounter **          Review of Systems  Constitutional: Positive for activity change and fatigue. Negative for appetite change, chills, diaphoresis, fever and unexpected weight change.  Gastrointestinal: Negative for abdominal pain, nausea and vomiting.  Musculoskeletal: Positive for arthralgias, back pain and myalgias. Negative for gait problem and joint swelling.  Psychiatric/Behavioral: Positive for sleep disturbance. Negative for dysphoric mood. The patient is not nervous/anxious.   All other systems reviewed and are negative.      Objective:  Physical Exam  Constitutional: She is oriented to person, place, and time. She appears well-developed and well-nourished. No distress.  HENT:  Head: Normocephalic and atraumatic.  Right Ear: Tympanic membrane, external ear and ear canal normal.  Left Ear:  Tympanic membrane, external ear and ear canal normal.  Nose: Nose normal. No mucosal edema or rhinorrhea.  Mouth/Throat: Uvula is midline, oropharynx is clear and moist and mucous membranes are normal. No posterior oropharyngeal erythema.  Eyes: Conjunctivae and EOM are normal. Pupils are equal, round, and reactive to light. Right eye exhibits no discharge. Left eye exhibits no discharge. No scleral icterus.  Neck: Normal range of  motion. Neck supple. No thyromegaly present.  Cardiovascular: Normal rate, regular rhythm, normal heart sounds and intact distal pulses.   Pulmonary/Chest: Effort normal and breath sounds normal. No respiratory distress.  Abdominal: Soft. Bowel sounds are normal. There is no tenderness.  Musculoskeletal: She exhibits no edema.  Lymphadenopathy:    She has no cervical adenopathy.  Neurological: She is alert and oriented to person, place, and time. She has normal reflexes.  Skin: Skin is warm and dry. She is not diaphoretic. No erythema.  Psychiatric: She has a normal mood and affect. Her behavior is normal.    BP 124/88 (BP Location: Left Arm, Patient Position: Sitting, Cuff Size: Small)   Pulse 84   Temp 98.5 F (36.9 C) (Oral)   Resp 16   Ht 5\' 8"  (1.727 m)   Wt 217 lb 3.2 oz (98.5 kg)   LMP 02/08/2012   SpO2 98%   BMI 33.03 kg/m      Assessment & Plan:  Pt requests that lab results be mailed or called - she does not use mychart even though she did sign up for it and so it is active.  1. Annual physical exam - seeing gyn Dr. Toney Rakes for well-woman care, nml mammo in Feb of this yr.  2. Elevated glucose - a1c improved from prior despite weight gain - pt encouraged.  3. Elevated ALT measurement - recheck, now normal, pt reassured - RUQ [place] last yr showed likely hepatic steatosis  4. Obesity - worse with working 3rd shift long term, husband diagnosed with brain cancer in past yr so life has changed - eating more, exercising less as he is going through trx  but overall doing well  5. Screening for STD (sexually transmitted disease)   6. Screening for thyroid disorder   7. Screening for cardiovascular, respiratory, and genitourinary diseases   8. Screening for deficiency anemia   9. Encounter for vitamin deficiency screening   10.     Chronic MSK low back pain - from heavy lifting occ at work, refilled prn flexeril.  Sleeping on back, ice, stretching so sees chr 11.    Rosacea - refilled doxy, helping a lot.  Orders Placed This Encounter  Procedures  . Comprehensive metabolic panel    Order Specific Question:  Has the patient fasted?    Answer:  Yes  . Lipid panel    Order Specific Question:  Has the patient fasted?    Answer:  Yes  . CBC  . TSH  . POCT glycosylated hemoglobin (Hb A1C)  . POCT urinalysis dipstick    Meds ordered this encounter  Medications  . doxycycline (ORACEA) 40 MG capsule    Sig: Take 1 capsule (40 mg total) by mouth every morning.    Dispense:  90 capsule    Refill:  3  . cyclobenzaprine (FLEXERIL) 5 MG tablet    Sig: Take 1 tablet (5 mg total) by mouth 3 (three) times daily as needed.    Dispense:  30 tablet    Refill:  1    Please leave on file to fill as pt needs    [name], MD MPH  Results for orders placed or performed in visit on 06/20/15  Hemoglobin A1c  Result Value Ref Range   Hgb A1c MFr Bld 5.8 (H) <5.7 %   Mean Plasma Glucose 120 (H) <117 mg/dL

## 2016-07-16 NOTE — Patient Instructions (Addendum)
   IF you received an x-ray today, you will receive an invoice from [facility]. Please contact [facility] at [phone] with questions or concerns regarding your invoice.   IF you received labwork today, you will receive an invoice from [facility]. Please contact [facility] at [phone] with questions or concerns regarding your invoice.   Our billing staff will not be able to assist you with questions regarding bills from these companies.  You will be contacted with the lab results as soon as they are available. The fastest way to get your results is to activate your My Chart account. Instructions are located on the last page of this paperwork. If you have not heard from us regarding the results in 2 weeks, please contact this office.     Health Maintenance, Female Adopting a healthy lifestyle and getting preventive care can go a long way to promote health and wellness. Talk with your health care provider about what schedule of regular examinations is right for you. This is a good chance for you to check in with your provider about disease prevention and staying healthy. In between checkups, there are plenty of things you can do on your own. Experts have done a lot of research about which lifestyle changes and preventive measures are most likely to keep you healthy. Ask your health care provider for more information. WEIGHT AND DIET  Eat a healthy diet  Be sure to include plenty of vegetables, fruits, low-fat dairy products, and lean protein.  Do not eat a lot of foods high in solid fats, added sugars, or salt.  Get regular exercise. This is one of the most important things you can do for your health.  Most adults should exercise for at least 150 minutes each week. The exercise should increase your heart rate and make you sweat (moderate-intensity exercise).  Most adults should also do strengthening exercises at least twice a week. This  is in addition to the moderate-intensity exercise.  Maintain a healthy weight  Body mass index (BMI) is a measurement that can be used to identify possible weight problems. It estimates body fat based on height and weight. Your health care provider can help determine your BMI and help you achieve or maintain a healthy weight.  For females 20 years of age and older:   A BMI below 18.5 is considered underweight.  A BMI of 18.5 to 24.9 is normal.  A BMI of 25 to 29.9 is considered overweight.  A BMI of 30 and above is considered obese.  Watch levels of cholesterol and blood lipids  You should start having your blood tested for lipids and cholesterol at 49 years of age, then have this test every 5 years.  You may need to have your cholesterol levels checked more often if:  Your lipid or cholesterol levels are high.  You are older than 50 years of age.  You are at high risk for heart disease.  CANCER SCREENING   Lung Cancer  Lung cancer screening is recommended for adults 55-80 years old who are at high risk for lung cancer because of a history of smoking.  A yearly low-dose CT scan of the lungs is recommended for people who:  Currently smoke.  Have quit within the past 15 years.  Have at least a 30-pack-year history of smoking. A pack year is smoking an average of one pack of cigarettes a day for 1 year.  Yearly screening should continue until it has been   15 years since you quit.  Yearly screening should stop if you develop a health problem that would prevent you from having lung cancer treatment.  Breast Cancer  Practice breast self-awareness. This means understanding how your breasts normally appear and feel.  It also means doing regular breast self-exams. Let your health care provider know about any changes, no matter how small.  If you are in your 20s or 30s, you should have a clinical breast exam (CBE) by a health care provider every 1-3 years as part of a  regular health exam.  If you are 40 or older, have a CBE every year. Also consider having a breast X-ray (mammogram) every year.  If you have a family history of breast cancer, talk to your health care provider about genetic screening.  If you are at high risk for breast cancer, talk to your health care provider about having an MRI and a mammogram every year.  Breast cancer gene (BRCA) assessment is recommended for women who have family members with BRCA-related cancers. BRCA-related cancers include:  Breast.  Ovarian.  Tubal.  Peritoneal cancers.  Results of the assessment will determine the need for genetic counseling and BRCA1 and BRCA2 testing. Cervical Cancer Your health care provider may recommend that you be screened regularly for cancer of the pelvic organs (ovaries, uterus, and vagina). This screening involves a pelvic examination, including checking for microscopic changes to the surface of your cervix (Pap test). You may be encouraged to have this screening done every 3 years, beginning at age 21.  For women ages 30-65, health care providers may recommend pelvic exams and Pap testing every 3 years, or they may recommend the Pap and pelvic exam, combined with testing for human papilloma virus (HPV), every 5 years. Some types of HPV increase your risk of cervical cancer. Testing for HPV may also be done on women of any age with unclear Pap test results.  Other health care providers may not recommend any screening for nonpregnant women who are considered low risk for pelvic cancer and who do not have symptoms. Ask your health care provider if a screening pelvic exam is right for you.  If you have had past treatment for cervical cancer or a condition that could lead to cancer, you need Pap tests and screening for cancer for at least 20 years after your treatment. If Pap tests have been discontinued, your risk factors (such as having a new sexual partner) need to be reassessed to  determine if screening should resume. Some women have medical problems that increase the chance of getting cervical cancer. In these cases, your health care provider may recommend more frequent screening and Pap tests. Colorectal Cancer  This type of cancer can be detected and often prevented.  Routine colorectal cancer screening usually begins at 50 years of age and continues through 49 years of age.  Your health care provider may recommend screening at an earlier age if you have risk factors for colon cancer.  Your health care provider may also recommend using home test kits to check for hidden blood in the stool.  A small camera at the end of a tube can be used to examine your colon directly (sigmoidoscopy or colonoscopy). This is done to check for the earliest forms of colorectal cancer.  Routine screening usually begins at age 50.  Direct examination of the colon should be repeated every 5-10 years through 49 years of age. However, you may need to be screened more often if   early forms of precancerous polyps or small growths are found. Skin Cancer  Check your skin from head to toe regularly.  Tell your health care provider about any new moles or changes in moles, especially if there is a change in a mole's shape or color.  Also tell your health care provider if you have a mole that is larger than the size of a pencil eraser.  Always use sunscreen. Apply sunscreen liberally and repeatedly throughout the day.  Protect yourself by wearing long sleeves, pants, a wide-brimmed hat, and sunglasses whenever you are outside. HEART DISEASE, DIABETES, AND HIGH BLOOD PRESSURE   High blood pressure causes heart disease and increases the risk of stroke. High blood pressure is more likely to develop in:  People who have blood pressure in the high end of the normal range (130-139/85-89 mm Hg).  People who are overweight or obese.  People who are African American.  If you are 18-39 years of  age, have your blood pressure checked every 3-5 years. If you are 40 years of age or older, have your blood pressure checked every year. You should have your blood pressure measured twice--once when you are at a hospital or clinic, and once when you are not at a hospital or clinic. Record the average of the two measurements. To check your blood pressure when you are not at a hospital or clinic, you can use:  An automated blood pressure machine at a pharmacy.  A home blood pressure monitor.  If you are between 55 years and 79 years old, ask your health care provider if you should take aspirin to prevent strokes.  Have regular diabetes screenings. This involves taking a blood sample to check your fasting blood sugar level.  If you are at a normal weight and have a low risk for diabetes, have this test once every three years after 49 years of age.  If you are overweight and have a high risk for diabetes, consider being tested at a younger age or more often. PREVENTING INFECTION  Hepatitis B  If you have a higher risk for hepatitis B, you should be screened for this virus. You are considered at high risk for hepatitis B if:  You were born in a country where hepatitis B is common. Ask your health care provider which countries are considered high risk.  Your parents were born in a high-risk country, and you have not been immunized against hepatitis B (hepatitis B vaccine).  You have HIV or AIDS.  You use needles to inject street drugs.  You live with someone who has hepatitis B.  You have had sex with someone who has hepatitis B.  You get hemodialysis treatment.  You take certain medicines for conditions, including cancer, organ transplantation, and autoimmune conditions. Hepatitis C  Blood testing is recommended for:  Everyone born from 1945 through 1965.  Anyone with known risk factors for hepatitis C. Sexually transmitted infections (STIs)  You should be screened for sexually  transmitted infections (STIs) including gonorrhea and chlamydia if:  You are sexually active and are younger than 49 years of age.  You are older than 49 years of age and your health care provider tells you that you are at risk for this type of infection.  Your sexual activity has changed since you were last screened and you are at an increased risk for chlamydia or gonorrhea. Ask your health care provider if you are at risk.  If you do not have HIV, but   are at risk, it may be recommended that you take a prescription medicine daily to prevent HIV infection. This is called pre-exposure prophylaxis (PrEP). You are considered at risk if:  You are sexually active and do not regularly use condoms or know the HIV status of your partner(s).  You take drugs by injection.  You are sexually active with a partner who has HIV. Talk with your health care provider about whether you are at high risk of being infected with HIV. If you choose to begin PrEP, you should first be tested for HIV. You should then be tested every 3 months for as long as you are taking PrEP.  PREGNANCY   If you are premenopausal and you may become pregnant, ask your health care provider about preconception counseling.  If you may become pregnant, take 400 to 800 micrograms (mcg) of folic acid every day.  If you want to prevent pregnancy, talk to your health care provider about birth control (contraception). OSTEOPOROSIS AND MENOPAUSE   Osteoporosis is a disease in which the bones lose minerals and strength with aging. This can result in serious bone fractures. Your risk for osteoporosis can be identified using a bone density scan.  If you are 68 years of age or older, or if you are at risk for osteoporosis and fractures, ask your health care provider if you should be screened.  Ask your health care provider whether you should take a calcium or vitamin D supplement to lower your risk for osteoporosis.  Menopause may have  certain physical symptoms and risks.  Hormone replacement therapy may reduce some of these symptoms and risks. Talk to your health care provider about whether hormone replacement therapy is right for you.  HOME CARE INSTRUCTIONS   Schedule regular health, dental, and eye exams.  Stay current with your immunizations.   Do not use any tobacco products including cigarettes, chewing tobacco, or electronic cigarettes.  If you are pregnant, do not drink alcohol.  If you are breastfeeding, limit how much and how often you drink alcohol.  Limit alcohol intake to no more than 1 drink per day for nonpregnant women. One drink equals 12 ounces of beer, 5 ounces of wine, or 1 ounces of hard liquor.  Do not use street drugs.  Do not share needles.  Ask your health care provider for help if you need support or information about quitting drugs.  Tell your health care provider if you often feel depressed.  Tell your health care provider if you have ever been abused or do not feel safe at home.   This information is not intended to replace advice given to you by your health care provider. Make sure you discuss any questions you have with your health care provider.   Document Released: 03/23/2011 Document Revised: 09/28/2014 Document Reviewed: 08/09/2013 Elsevier Interactive Patient Education [employer].

## 2016-11-16 ENCOUNTER — Telehealth: Payer: Self-pay | Admitting: *Deleted

## 2016-11-16 NOTE — Telephone Encounter (Signed)
Pt called stating she will be starting a new job and will need refills on Evamist 1.53 mg spray, pt has refills until May, but with new jobs she will have to wait for 90 day to get new insurance. I told pt let us know once refills run out and Rx can be sent to pharmacy.

## 2016-11-17 ENCOUNTER — Telehealth: Payer: Self-pay | Admitting: *Deleted

## 2016-11-17 NOTE — Telephone Encounter (Signed)
Left message for pt to call to find out what pharmacy to send Rx.

## 2016-11-17 NOTE — Telephone Encounter (Signed)
Yes she can use two pops daily on one arm only.

## 2016-11-17 NOTE — Telephone Encounter (Signed)
Pt takes Evamist 1.53 mg apply 1 spray to arm daily, states Rx has been doing well, but at times she has notice increase hot flashes off and on, asked if Rx could be increased to 2 sprays daily? Please advise

## 2016-11-18 MED ORDER — ESTRADIOL 1.53 MG/SPRAY TD SOLN: 2.0000 | Freq: Every day | TRANSDERMAL | 11 refills | Status: DC

## 2016-11-19 MED ORDER — ESTRADIOL 0.52 MG/0.87 GM (0.06%) TD GEL: TRANSDERMAL | 3 refills | Status: DC

## 2016-11-19 NOTE — Telephone Encounter (Signed)
Dr.Fernandez pt called back and was confusing the names of medication, she doesn't take the Evamist spray states tried and it didn't help with symptoms, she takes the elestrin gel 0.06 % which you originally prescribed at annual in May 2017. Pt would like to elestrin gel and asked if the direction on this Rx could be for 2 pumps daily? Please advise

## 2016-11-19 NOTE — Telephone Encounter (Signed)
Correct Rx sent for elestrin 0.06%

## 2016-11-19 NOTE — Telephone Encounter (Signed)
I received a different message yesterday. I typicaly dont prescribe evamist but do Elestrin. See can use 2 pumps on one arm only at bed time.

## 2016-11-24 ENCOUNTER — Telehealth: Payer: Self-pay | Admitting: Family Medicine

## 2016-11-24 NOTE — Telephone Encounter (Signed)
She has my chart  Please advise about the refill

## 2016-11-24 NOTE — Telephone Encounter (Signed)
Pt calling because she has not heard back from her labs from last CPE on 07/16/16. She will also be with ins between job switches and would like to know if there is any way she can write the rx for oracea to state "take 2x daily". She will only take once daily, but this way she will have enough to cover during her ins lapse. Please advise.

## 2016-11-26 MED ORDER — DOXYCYCLINE 40 MG PO CPDR: 40.0000 mg | DELAYED_RELEASE_CAPSULE | Freq: Two times a day (BID) | ORAL | 1 refills | Status: DC

## 2016-11-26 NOTE — Telephone Encounter (Addendum)
Absolutely - new rx for bid sent to pharmacy. Labs on MyChart - they are all FABULOUS! Getting a new job??? YAY!

## 2016-11-27 NOTE — Telephone Encounter (Signed)
Lm with dr Manya Silvas note

## 2017-02-03 ENCOUNTER — Encounter: Payer: Self-pay | Admitting: Gynecology

## 2017-04-15 ENCOUNTER — Encounter: Payer: 59 | Admitting: Family Medicine

## 2017-04-29 ENCOUNTER — Encounter: Payer: 59 | Admitting: Family Medicine

## 2017-08-02 ENCOUNTER — Telehealth: Payer: Self-pay | Admitting: *Deleted

## 2017-08-02 NOTE — Telephone Encounter (Signed)
Patient will be continuing care with you) Patient started new job, won't have insurance until Jan 2019, currently using estradiol 0.06% gel, but medication is over $100 without insurance. Pt asked if you would be wiling to prescribe a pill form of estrogen until she can have annual exam in Jan 2019?  Please advise

## 2017-08-03 MED ORDER — ESTRADIOL 1 MG PO TABS: 1.0000 mg | ORAL_TABLET | Freq: Every day | ORAL | 0 refills | Status: DC

## 2017-08-03 NOTE — Telephone Encounter (Signed)
Pt aware, Rx sent. 

## 2017-08-03 NOTE — Telephone Encounter (Signed)
Chart reviewed.  S/P Hysterectomy.  Agree with Estradiol 1 mg PO daily.

## 2017-10-06 ENCOUNTER — Encounter: Payer: 59 | Admitting: Family Medicine

## 2017-11-06 ENCOUNTER — Emergency Department (HOSPITAL_COMMUNITY): Payer: 59

## 2017-11-06 ENCOUNTER — Inpatient Hospital Stay (HOSPITAL_COMMUNITY)
Admission: EM | Admit: 2017-11-06 | Discharge: 2017-11-09 | DRG: 917 | Disposition: A | Discharge status: Other Acute Inpatient Hospital | Payer: 59 | Attending: Internal Medicine | Admitting: Internal Medicine

## 2017-11-06 ENCOUNTER — Inpatient Hospital Stay (HOSPITAL_COMMUNITY): Payer: 59

## 2017-11-06 ENCOUNTER — Encounter (HOSPITAL_COMMUNITY): Payer: Self-pay | Admitting: *Deleted

## 2017-11-06 DIAGNOSIS — J9600 Acute respiratory failure, unspecified whether with hypoxia or hypercapnia: Secondary | ICD-10-CM | POA: Diagnosis not present

## 2017-11-06 DIAGNOSIS — R450 Nervousness: Secondary | ICD-10-CM | POA: Diagnosis not present

## 2017-11-06 DIAGNOSIS — Z90710 Acquired absence of both cervix and uterus: Secondary | ICD-10-CM | POA: Diagnosis not present

## 2017-11-06 DIAGNOSIS — G92 Toxic encephalopathy: Secondary | ICD-10-CM | POA: Diagnosis present

## 2017-11-06 DIAGNOSIS — J9690 Respiratory failure, unspecified, unspecified whether with hypoxia or hypercapnia: Secondary | ICD-10-CM | POA: Diagnosis not present

## 2017-11-06 DIAGNOSIS — E870 Hyperosmolality and hypernatremia: Secondary | ICD-10-CM | POA: Diagnosis present

## 2017-11-06 DIAGNOSIS — Z885 Allergy status to narcotic agent status: Secondary | ICD-10-CM | POA: Diagnosis not present

## 2017-11-06 DIAGNOSIS — F1010 Alcohol abuse, uncomplicated: Secondary | ICD-10-CM | POA: Diagnosis present

## 2017-11-06 DIAGNOSIS — Z4682 Encounter for fitting and adjustment of non-vascular catheter: Secondary | ICD-10-CM | POA: Diagnosis not present

## 2017-11-06 DIAGNOSIS — Z634 Disappearance and death of family member: Secondary | ICD-10-CM | POA: Diagnosis not present

## 2017-11-06 DIAGNOSIS — R402342 Coma scale, best motor response, flexion withdrawal, at arrival to emergency department: Secondary | ICD-10-CM | POA: Diagnosis not present

## 2017-11-06 DIAGNOSIS — Z9911 Dependence on respirator [ventilator] status: Secondary | ICD-10-CM | POA: Diagnosis present

## 2017-11-06 DIAGNOSIS — F17210 Nicotine dependence, cigarettes, uncomplicated: Secondary | ICD-10-CM | POA: Diagnosis not present

## 2017-11-06 DIAGNOSIS — T424X2A Poisoning by benzodiazepines, intentional self-harm, initial encounter: Principal | ICD-10-CM | POA: Diagnosis present

## 2017-11-06 DIAGNOSIS — R55 Syncope and collapse: Secondary | ICD-10-CM | POA: Diagnosis not present

## 2017-11-06 DIAGNOSIS — Z79899 Other long term (current) drug therapy: Secondary | ICD-10-CM

## 2017-11-06 DIAGNOSIS — F909 Attention-deficit hyperactivity disorder, unspecified type: Secondary | ICD-10-CM | POA: Diagnosis present

## 2017-11-06 DIAGNOSIS — M25512 Pain in left shoulder: Secondary | ICD-10-CM | POA: Diagnosis not present

## 2017-11-06 DIAGNOSIS — Z91410 Personal history of adult physical and sexual abuse: Secondary | ICD-10-CM | POA: Diagnosis not present

## 2017-11-06 DIAGNOSIS — F341 Dysthymic disorder: Secondary | ICD-10-CM | POA: Diagnosis not present

## 2017-11-06 DIAGNOSIS — Z720 Tobacco use: Secondary | ICD-10-CM | POA: Diagnosis not present

## 2017-11-06 DIAGNOSIS — R402212 Coma scale, best verbal response, none, at arrival to emergency department: Secondary | ICD-10-CM | POA: Diagnosis present

## 2017-11-06 DIAGNOSIS — Z564 Discord with boss and workmates: Secondary | ICD-10-CM | POA: Diagnosis not present

## 2017-11-06 DIAGNOSIS — Z6379 Other stressful life events affecting family and household: Secondary | ICD-10-CM | POA: Diagnosis not present

## 2017-11-06 DIAGNOSIS — Z915 Personal history of self-harm: Secondary | ICD-10-CM | POA: Diagnosis not present

## 2017-11-06 DIAGNOSIS — T1491XA Suicide attempt, initial encounter: Secondary | ICD-10-CM | POA: Diagnosis not present

## 2017-11-06 DIAGNOSIS — T5192XA Toxic effect of unspecified alcohol, intentional self-harm, initial encounter: Secondary | ICD-10-CM | POA: Diagnosis not present

## 2017-11-06 DIAGNOSIS — Z638 Other specified problems related to primary support group: Secondary | ICD-10-CM | POA: Diagnosis not present

## 2017-11-06 DIAGNOSIS — T887XXA Unspecified adverse effect of drug or medicament, initial encounter: Secondary | ICD-10-CM | POA: Diagnosis not present

## 2017-11-06 DIAGNOSIS — Y908 Blood alcohol level of 240 mg/100 ml or more: Secondary | ICD-10-CM | POA: Diagnosis present

## 2017-11-06 DIAGNOSIS — R4020 Unspecified coma: Secondary | ICD-10-CM | POA: Diagnosis not present

## 2017-11-06 DIAGNOSIS — Z566 Other physical and mental strain related to work: Secondary | ICD-10-CM | POA: Diagnosis not present

## 2017-11-06 DIAGNOSIS — F332 Major depressive disorder, recurrent severe without psychotic features: Secondary | ICD-10-CM | POA: Diagnosis present

## 2017-11-06 DIAGNOSIS — F419 Anxiety disorder, unspecified: Secondary | ICD-10-CM | POA: Diagnosis not present

## 2017-11-06 DIAGNOSIS — G4700 Insomnia, unspecified: Secondary | ICD-10-CM | POA: Diagnosis not present

## 2017-11-06 DIAGNOSIS — R402112 Coma scale, eyes open, never, at arrival to emergency department: Secondary | ICD-10-CM | POA: Diagnosis present

## 2017-11-06 DIAGNOSIS — T50904A Poisoning by unspecified drugs, medicaments and biological substances, undetermined, initial encounter: Secondary | ICD-10-CM | POA: Diagnosis not present

## 2017-11-06 DIAGNOSIS — J9811 Atelectasis: Secondary | ICD-10-CM | POA: Diagnosis not present

## 2017-11-06 DIAGNOSIS — T50901A Poisoning by unspecified drugs, medicaments and biological substances, accidental (unintentional), initial encounter: Secondary | ICD-10-CM | POA: Diagnosis present

## 2017-11-06 LAB — COMPREHENSIVE METABOLIC PANEL
ALT: 25 U/L (ref 14–54)
AST: 27 U/L (ref 15–41)
Albumin: 4.3 g/dL (ref 3.5–5.0)
Alkaline Phosphatase: 57 U/L (ref 38–126)
Anion gap: 10 (ref 5–15)
BUN: 8 mg/dL (ref 6–20)
CO2: 26 mmol/L (ref 22–32)
Calcium: 9.2 mg/dL (ref 8.9–10.3)
Chloride: 111 mmol/L (ref 101–111)
Creatinine, Ser: 0.49 mg/dL (ref 0.44–1.00)
GFR calc Af Amer: >60 mL/min (ref >60)
GFR calc non Af Amer: >60 mL/min (ref >60)
Glucose, Bld: 105 mg/dL — ABNORMAL HIGH (ref 65–99)
Potassium: 3.6 mmol/L (ref 3.5–5.1)
Sodium: 147 mmol/L — ABNORMAL HIGH (ref 135–145)
Total Bilirubin: 0.7 mg/dL (ref 0.3–1.2)
Total Protein: 7 g/dL (ref 6.5–8.1)

## 2017-11-06 LAB — RAPID URINE DRUG SCREEN, HOSP PERFORMED
Amphetamines: NOT DETECTED
Barbiturates: NOT DETECTED
Benzodiazepines: POSITIVE — AB
Cocaine: NOT DETECTED
Opiates: NOT DETECTED
Tetrahydrocannabinol: NOT DETECTED

## 2017-11-06 LAB — BLOOD GAS, ARTERIAL
ACID-BASE DEFICIT: 3 mmol/L — AB (ref 0.0–2.0)
BICARBONATE: 21.7 mmol/L (ref 20.0–28.0)
Drawn by: 11249
FIO2: 100
LHR: 14 {breaths}/min
MECHVT: 550 mL
O2 Saturation: 99.3 %
PATIENT TEMPERATURE: 95
PCO2 ART: 35.8 mmHg (ref 32.0–48.0)
PEEP/CPAP: 5 cmH2O
PH ART: 7.388 (ref 7.350–7.450)
PO2 ART: 456 mmHg — AB (ref 83.0–108.0)

## 2017-11-06 LAB — CBC WITH DIFFERENTIAL/PLATELET
Basophils Absolute: 0 10*3/uL (ref 0.0–0.1)
Basophils Relative: 0 %
Eosinophils Absolute: 0 10*3/uL (ref 0.0–0.7)
Eosinophils Relative: 0 %
HCT: 41.7 % (ref 36.0–46.0)
Hemoglobin: 14.2 g/dL (ref 12.0–15.0)
Lymphocytes Relative: 30 %
Lymphs Abs: 2.1 10*3/uL (ref 0.7–4.0)
MCH: 32.7 pg (ref 26.0–34.0)
MCHC: 34.1 g/dL (ref 30.0–36.0)
MCV: 96.1 fL (ref 78.0–100.0)
Monocytes Absolute: 0.5 10*3/uL (ref 0.1–1.0)
Monocytes Relative: 8 %
Neutro Abs: 4.2 10*3/uL (ref 1.7–7.7)
Neutrophils Relative %: 62 %
Platelets: 228 10*3/uL (ref 150–400)
RBC: 4.34 MIL/uL (ref 3.87–5.11)
RDW: 12.5 % (ref 11.5–15.5)
WBC: 6.9 10*3/uL (ref 4.0–10.5)

## 2017-11-06 LAB — TRIGLYCERIDES: Triglycerides: 83 mg/dL (ref <150)

## 2017-11-06 LAB — GLUCOSE, CAPILLARY
GLUCOSE-CAPILLARY: 96 mg/dL (ref 65–99)
Glucose-Capillary: 80 mg/dL (ref 65–99)

## 2017-11-06 LAB — MRSA PCR SCREENING: MRSA BY PCR: NEGATIVE

## 2017-11-06 LAB — ETHANOL: Alcohol, Ethyl (B): 290 mg/dL — ABNORMAL HIGH (ref <10)

## 2017-11-06 LAB — PROCALCITONIN: Procalcitonin: <0.1 ng/mL

## 2017-11-06 LAB — LACTIC ACID, PLASMA: Lactic Acid, Venous: 2 mmol/L (ref 0.5–1.9)

## 2017-11-06 LAB — ACETAMINOPHEN LEVEL: Acetaminophen (Tylenol), Serum: <10 ug/mL — ABNORMAL LOW (ref 10–30)

## 2017-11-06 LAB — CK: Total CK: 124 U/L (ref 38–234)

## 2017-11-06 LAB — SALICYLATE LEVEL: Salicylate Lvl: <7 mg/dL (ref 2.8–30.0)

## 2017-11-06 LAB — CBG MONITORING, ED: Glucose-Capillary: 94 mg/dL (ref 65–99)

## 2017-11-06 MED ORDER — PROPOFOL 1000 MG/100ML IV EMUL
5.0000 ug/kg/min | INTRAVENOUS | Status: DC
  Administered 2017-11-06: 15 ug/kg/min via INTRAVENOUS
  Administered 2017-11-07 (×2): 30 ug/kg/min via INTRAVENOUS
  Filled 2017-11-06 (×2): qty 100

## 2017-11-06 MED ORDER — NALOXONE HCL 2 MG/2ML IJ SOSY
PREFILLED_SYRINGE | INTRAMUSCULAR | Status: AC
  Filled 2017-11-06: qty 2

## 2017-11-06 MED ORDER — PANTOPRAZOLE SODIUM 40 MG IV SOLR
40.0000 mg | Freq: Every day | INTRAVENOUS | Status: DC
  Administered 2017-11-06 – 2017-11-07 (×2): 40 mg via INTRAVENOUS
  Filled 2017-11-06 (×2): qty 40

## 2017-11-06 MED ORDER — LACTATED RINGERS IV SOLN
INTRAVENOUS | Status: DC
Start: 2017-11-06 — End: 2017-11-09
  Administered 2017-11-06 – 2017-11-07 (×2): via INTRAVENOUS

## 2017-11-06 MED ORDER — PROPOFOL 1000 MG/100ML IV EMUL
5.0000 ug/kg/min | Freq: Once | INTRAVENOUS | Status: AC
  Administered 2017-11-06: 5 ug/kg/min via INTRAVENOUS

## 2017-11-06 MED ORDER — FENTANYL CITRATE (PF) 100 MCG/2ML IJ SOLN: 100.0000 ug | INTRAMUSCULAR | Status: DC | PRN

## 2017-11-06 MED ORDER — FENTANYL CITRATE (PF) 100 MCG/2ML IJ SOLN
100.0000 ug | INTRAMUSCULAR | Status: DC | PRN
Start: 2017-11-06 — End: 2017-11-07
  Administered 2017-11-07: 50 ug via INTRAVENOUS
  Filled 2017-11-06: qty 2

## 2017-11-06 MED ORDER — THIAMINE HCL 100 MG/ML IJ SOLN
100.0000 mg | Freq: Every day | INTRAMUSCULAR | Status: DC
  Administered 2017-11-07 – 2017-11-08 (×2): 100 mg via INTRAVENOUS
  Filled 2017-11-06 (×2): qty 2

## 2017-11-06 MED ORDER — HEPARIN SODIUM (PORCINE) 5000 UNIT/ML IJ SOLN
5000.0000 [IU] | Freq: Three times a day (TID) | INTRAMUSCULAR | Status: DC
  Administered 2017-11-06 – 2017-11-08 (×5): 5000 [IU] via SUBCUTANEOUS
  Filled 2017-11-06 (×7): qty 1

## 2017-11-06 MED ORDER — FOLIC ACID 5 MG/ML IJ SOLN
1.0000 mg | Freq: Every day | INTRAMUSCULAR | Status: DC
  Administered 2017-11-07 – 2017-11-08 (×2): 1 mg via INTRAVENOUS
  Filled 2017-11-06 (×2): qty 0.2

## 2017-11-06 MED ORDER — NALOXONE HCL 2 MG/2ML IJ SOSY
2.0000 mg | PREFILLED_SYRINGE | Freq: Once | INTRAMUSCULAR | Status: AC
  Administered 2017-11-06: 2 mg via INTRAVENOUS

## 2017-11-06 MED ORDER — SODIUM CHLORIDE 0.9 % IV BOLUS (SEPSIS)
1000.0000 mL | Freq: Once | INTRAVENOUS | Status: AC
Start: 2017-11-06 — End: 2017-11-06
  Administered 2017-11-06: 1000 mL via INTRAVENOUS

## 2017-11-06 MED ORDER — PROPOFOL 1000 MG/100ML IV EMUL
INTRAVENOUS | Status: AC
  Administered 2017-11-06: 15 ug/kg/min via INTRAVENOUS
  Filled 2017-11-06: qty 100

## 2017-11-06 MED ORDER — ETOMIDATE 2 MG/ML IV SOLN
30.0000 mg | Freq: Once | INTRAVENOUS | Status: AC
  Administered 2017-11-06: 20 mg via INTRAVENOUS

## 2017-11-06 MED ORDER — SODIUM CHLORIDE 0.9 % IV SOLN: 250.0000 mL | INTRAVENOUS | Status: DC | PRN

## 2017-11-06 MED ORDER — SUCCINYLCHOLINE CHLORIDE 20 MG/ML IJ SOLN
100.0000 mg | Freq: Once | INTRAMUSCULAR | Status: AC
  Administered 2017-11-06: 100 mg via INTRAVENOUS
  Filled 2017-11-06: qty 5

## 2017-11-06 NOTE — ED Triage Notes (Signed)
EMS reports pt took approx 36 0.5mg  Xanax with ETOH. Prescription for Xanax not hers, pt maintaining airway,unresponsive to all stimuli on arrival. IV #20 in rt hand, NPA in place left nare.

## 2017-11-06 NOTE — H&P (Signed)
.. ..  Name: Crystal Williamson MRN: [id number] DOB: 04-May-1966    ADMISSION DATE:  11/06/2017 CONSULTATION DATE:  11/06/17  REFERRING MD :  Wilson Singer MD  CHIEF COMPLAINT:  ALTERED MENTAL STATUS  BRIEF PATIENT DESCRIPTION: 52 yr old female with PMHx Depression suspected ingestion of 0.5mg  Xanax (56 tabs) found unconscious, now intubated.  SIGNIFICANT EVENTS  Altered mental status ?Suicidal attempt Intubated  STUDIES:  CXR, ABG   HISTORY OF PRESENT ILLNESS:  52 yr old female with PMHx Depression, presents to [facility] post intentional OD with Xanax.  Per ED Physician note Xanax was prescribed to her friend. Pt reportedly took the remaining 56 tabs in the bottle along with alcohol. On presentation to the ED she was unconscious but protecting her airway. Pt received Narcan but remained GCS 6 and decision was made to intubate. There is no evidence in the history to suggest opiate use. UDS collected is positive for Benzodiazepines and ETOH level >200. Acteaminophen, Salicylates were negative.     PAST MEDICAL HISTORY :   has a past medical history of Allergy, Depression, Fibroid, Headache(784.0), No pertinent past medical history, and Varicose veins.  has a past surgical history that includes Septoplasty; Wisdom tooth extraction; Abdominal hysterectomy (06/01/2012); and Varicose vein surgery. Prior to Admission medications   Medication Sig Start Date End Date Taking? Authorizing Provider  CA & PHOS-VIT D-MAG PO Take 3 tablets by mouth at bedtime.    [provider]  cyclobenzaprine (FLEXERIL) 5 MG tablet Take 1 tablet (5 mg total) by mouth 3 (three) times daily as needed. 07/16/16   [name], MD  doxycycline (ORACEA) 40 MG capsule Take 1 capsule (40 mg total) by mouth 2 (two) times daily. 11/26/16   [name], MD  estradiol (ESTRACE) 1 MG tablet Take 1 tablet (1 mg total) daily by mouth. 08/03/17   Princess Bruins, MD  metroNIDAZOLE (METROGEL) 1 % gel Apply topically daily.  Reported on 02/13/2016    [provider]  Milk Thistle 200 MG CAPS Take 200-400 mg by mouth daily.    [provider]  OVER THE COUNTER MEDICATION Reported on 02/13/2016    [provider]  Probiotic Product (PROBIOTIC DAILY PO) Take 1 capsule by mouth.    [provider]   Allergies  Allergen Reactions  . Codeine Itching    REACTION: N \\T \ V  . Hydromorphone Itching  . Opium   . Percocet [Oxycodone-Acetaminophen] Itching    FAMILY HISTORY:  family history includes Breast cancer in her paternal grandmother; Cancer in her maternal grandfather and paternal grandfather; Colon cancer in her paternal grandfather; Diabetes in her maternal grandfather and paternal grandfather; Lumbar disc disease in her mother; Lupus in her mother; Stroke in her maternal grandmother; Varicose Veins in her mother. SOCIAL HISTORY:  reports that she has been smoking cigarettes.  She has been smoking about 0.25 packs per day. she has never used smokeless tobacco. She reports that she drinks alcohol. She reports that she does not use drugs.  REVIEW OF SYSTEMS:  (Unable to obtain due to metabolic encephalopathy secondary from benzodiazepine OD) Constitutional: Negative for fever, chills, weight loss, malaise/fatigue and diaphoresis.  HENT: Negative for hearing loss, ear pain, nosebleeds, congestion, sore throat, neck pain, tinnitus and ear discharge.   Eyes: Negative for blurred vision, double vision, photophobia, pain, discharge and redness.  Respiratory: Negative for cough, hemoptysis, sputum production, shortness of breath, wheezing and stridor.   Cardiovascular: Negative for chest pain, palpitations, orthopnea, claudication,  leg swelling and PND.  Gastrointestinal: Negative for heartburn, nausea, vomiting, abdominal pain, diarrhea, constipation, blood in stool and melena.  Genitourinary: Negative for dysuria, urgency, frequency, hematuria and flank pain.  Musculoskeletal: Negative  for myalgias, back pain, joint pain and falls.  Skin: Negative for itching and rash.  Neurological: Negative for dizziness, tingling, tremors, sensory change, speech change, focal weakness, seizures, loss of consciousness, weakness and headaches.  Endo/Heme/Allergies: Negative for environmental allergies and polydipsia. Does not bruise/bleed easily.  SUBJECTIVE:   VITAL SIGNS: Temp:  [95 F (35 C)] 95 F (35 C) (02/16 1930) Pulse Rate:  [62-80] 63 (02/16 1930) Resp:  [14-27] 14 (02/16 1930) BP: (95-137)/(62-96) 95/63 (02/16 1930) SpO2:  [100 %] 100 % (02/16 1930) FiO2 (%):  [100 %] 100 % (02/16 1805) Weight:  [97.5 kg (215 lb)] 97.5 kg (215 lb) (02/16 1814)  PHYSICAL EXAMINATION: General:  Intubated and sedated Neuro:  Sedated on propofol HEENT:  Normocephalic ETT and OGT in oropharynx Cardiovascular:  S1 and S2 appreciated + click Lungs:  Clear bilaterally Abdomen:  Soft non distended with + BS, left lower abdomen has a medium sized hematoma. Musculoskeletal:  No gross deformity. No edema Skin:  Grossly intact  Recent Labs  Lab 11/06/17 1752  NA 147*  K 3.6  CL 111  CO2 26  BUN 8  CREATININE 0.49  GLUCOSE 105*   Recent Labs  Lab 11/06/17 1752  HGB 14.2  HCT 41.7  WBC 6.9  PLT 228   Dg Abdomen 1 View  Result Date: 11/06/2017 CLINICAL DATA:  ET tube and OG tube placement. EXAM: ABDOMEN - 1 VIEW COMPARISON:  None FINDINGS: There is a enteric tube with tip projecting over the body of the stomach. Side port well below GE junction. No dilated bowel loops noted. IMPRESSION: 1. Enteric tube tip projects over the body of the stomach. Electronically Signed   By: Kerby Moors M.D.   On: 11/06/2017 18:17   Dg Chest Portable 1 View  Result Date: 11/06/2017 CLINICAL DATA:  Endotracheal tube placement. Unresponsive status post overdose. EXAM: PORTABLE CHEST 1 VIEW COMPARISON:  No comparison studies available. FINDINGS: 1751 hours. Leftward patient rotation. Left costophrenic  angle not included on the film. No evidence for focal airspace consolidation or pulmonary edema. No right pleural effusion and no substantial left pleural effusion evident. Endotracheal tube tip 3.8 cm above the base of the carina. The NG tube passes into the stomach although the distal tip position is not included on the film. The visualized bony structures of the thorax are intact. IMPRESSION: Limited study with out acute cardiopulmonary findings. Endotracheal and NG tubes as above. Electronically Signed   By: Misty Stanley M.D.   On: 11/06/2017 18:18    ASSESSMENT / PLAN: NEURO: Altered mental Status Severe Depression ? Suicidal Attempt Metabolic Encephalopathy 28mg  ingestion of Xanax suspected. GCS 6 prior to intubation Sedation with Propofol-> RASS goal 0 Reversal agent for Benzodiazepine is Flumazenil however not sure if patient is a chronic user and this medication can lower seizure threshold [place] Poison control contacted- per their recommendations  H/o ETOH abuse ETOH level >200 Risk of withdrawl 48-72 hrs post last drink Thiamine and Folic Acid ordered  Found lying on the floor R/O trauma  CT head and neck- stat  PSYCH: Severe Depression - secondary to loss of spouse to Brain Cancer in May 2017 - when extubated patient will need 1:1 and Psych evaluation  CARDIAC: Hemodynamically stable Normotensive Not requiring any Vasopressors at this  time EKG reviewed QRS 106 ms and QTc 471 Will continue to monitor  PULMONARY: Intubated for Increased Aspiration Risk due to Metabolic Encephalopathy Mechanical ventilation- PRVC No history of vomitting Reviewed CXR Daily wean protocol with SBT As tolerated   ID: No evidence of an active infection Trend WBC, fever curve and lactate  Endocrine: HgbA1c 5.1 No H/o insulin dependence   GI: NPO Placed OGT- discontinue from suction minimal output Start on PPI for prohylaxis No TF at this time    Gyn: S/p partial  Hysterectomy with Ovarian preservation Secondary to 16 week size leiomyomata   Heme: If Hgb<7 transfuse PRBCs No signs of active bleeding And no h/o coagulopathy DVT PPx-> hep [place] and SCDs  RENAL At Baseline Creatinine GFR >60 Lab Results  Component Value Date   CREATININE 0.49 11/06/2017   CREATININE 0.56 07/16/2016   CREATININE 0.58 06/20/2015  Replace electrolytes as needed Continuous LR @75  Found lying on the floor will check a CK Indwelling foley catheter inserted by ED      I, Dr Seward Carol have personally reviewed patient's available data, including medical history, events of note, physical examination and test results as part of my evaluation. I have discussed with NP Dewaine Oats and other care providers such as pharmacist, RN and Warren Lacy. The patient is critically ill with multiple organ systems failure and requires high complexity decision making for assessment and support, frequent evaluation and titration of therapies, application of advanced monitoring technologies and extensive interpretation of multiple databases. Critical Care Time devoted to patient care services described in this note is 49 Minutes. This time reflects time of care of this signee Dr Seward Carol. This critical care time does not reflect procedure time, or teaching time or supervisory time of NP but could involve care discussion time    DISPOSITION: ICU CC TIME: 55 mins CODE STATUS: Full PROGNOSIS: Guarded FAMILY: due to the sensitive nature of injury only discuss details of care with immediate family members.    Signed Dr Seward Carol Pulmonary and [place]   11/06/2017, 7:36 PM

## 2017-11-06 NOTE — ED Provider Notes (Signed)
[facility] DEPT Provider Note   CSN: [id number] Arrival date & time: 11/06/17  1733     History   Chief Complaint No chief complaint on file.   HPI Crystal Williamson is a 52 y.o. female.  HPI   52 year old female with reported intentional drug overdose.  Unclear as to exact time of ingestion but reportedly took 18mg  of Xanax. Prescription bottle that she found is not hers. Also reportedly a heavy drinker.  EMS reports that she notified her family by EMS was called.  On their arrival, she was lying down on the floor.  No reported vomiting.  No response to verbal or painful stimuli.  Nasal airway was placed.  Blood sugar in the 100s.  O2 sats were in the high 90s on room air.  Normotensive with a heart rate in the 80s-90s.  Past Medical History:  Diagnosis Date  . Allergy   . Depression   . Fibroid   . Headache(784.0)   . No pertinent past medical history   . Varicose veins    bilateral    Patient Active Problem List   Diagnosis Date Noted  . Alopecia 07/19/2012  . 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 HEADACHE 08/06/2009  . KERATOSIS PILARIS 06/14/2009  . HYPERGLYCEMIA 02/22/2009  . DERMATOPHYTOSIS OF NAIL 02/14/2009  . OBESITY 02/14/2009  . SEBORRHEIC KERATOSIS 02/14/2009  . LOW BACK PAIN, CHRONIC 02/14/2009  . FATIGUE 02/14/2009  . ATTENTION DEFICIT DISORDER, HX OF 02/14/2009    Past Surgical History:  Procedure Laterality Date  . ABDOMINAL HYSTERECTOMY  06/01/2012   Procedure: HYSTERECTOMY ABDOMINAL;  Surgeon: Terrance Mass, MD;  Location: [facility];  Service: Gynecology;  Laterality: N/A;  . SEPTOPLASTY    . VARICOSE VEIN SURGERY    . WISDOM TOOTH EXTRACTION      OB History    Gravida Para Term Preterm AB Living   2 0 0 0 1 0   SAB TAB Ectopic Multiple Live Births   1 0 0 0         Home Medications    Prior to Admission medications   Medication Sig Start Date End Date Taking? Authorizing Provider  CA & PHOS-VIT  D-MAG PO Take 3 tablets by mouth at bedtime.    [provider]  cyclobenzaprine (FLEXERIL) 5 MG tablet Take 1 tablet (5 mg total) by mouth 3 (three) times daily as needed. 07/16/16   [name], MD  doxycycline (ORACEA) 40 MG capsule Take 1 capsule (40 mg total) by mouth 2 (two) times daily. 11/26/16   [name], MD  estradiol (ESTRACE) 1 MG tablet Take 1 tablet (1 mg total) daily by mouth. 08/03/17   Princess Bruins, MD  metroNIDAZOLE (METROGEL) 1 % gel Apply topically daily. Reported on 02/13/2016    [provider]  Milk Thistle 200 MG CAPS Take 200-400 mg by mouth daily.    [provider]  OVER THE COUNTER MEDICATION Reported on 02/13/2016    [provider]  Probiotic Product (PROBIOTIC DAILY PO) Take 1 capsule by mouth.    [provider]    Family History Family History  Problem Relation Age of Onset  . Colon cancer Paternal Grandfather   . Diabetes Paternal Grandfather        type 2   . Cancer Paternal Grandfather        COLON  . Breast cancer Paternal Grandmother   . Stroke Maternal Grandmother   . Lupus  Mother   . Lumbar disc disease Mother   . Varicose Veins Mother   . Cancer Maternal Grandfather   . Diabetes Maternal Grandfather     Social History Social History   Tobacco Use  . Smoking status: Current Every Day Smoker    Packs/day: 0.25    Types: Cigarettes  . Smokeless tobacco: Never Used  . Tobacco comment: 5 cig a day  Substance Use Topics  . Alcohol use: No    Alcohol/week: 0.0 oz    Comment: 3-4x week   . Drug use: No     Allergies   Codeine; Hydromorphone; Opium; and Percocet [oxycodone-acetaminophen]   Review of Systems Review of Systems  Level 5 caveat because of unresponsiveness.   Physical Exam Updated Vital Signs LMP 02/08/2012   Physical Exam  Constitutional: She appears well-developed and well-nourished.  HENT:  Head: Normocephalic and atraumatic.  Eyes: Conjunctivae are normal.  Right eye exhibits no discharge. Left eye exhibits no discharge.  Neck: Neck supple.  Cardiovascular: Normal rate, regular rhythm and normal heart sounds. Exam reveals no gallop and no friction rub.  No murmur heard. Pulmonary/Chest: Breath sounds normal.  Snoring respirations.  Sounds clear bilaterally.  No gag.  Abdominal: Soft. She exhibits no distension. There is no tenderness.  Musculoskeletal: She exhibits no edema or tenderness.  Neurological: GCS eye subscore is 1. GCS verbal subscore is 1. GCS motor subscore is 4.  Skin: Skin is warm and dry.  Scattered ecchymosis to lower abdomen/anterior hips.  Nursing note and vitals reviewed.    ED Treatments / Results  Labs (all labs ordered are listed, but only abnormal results are displayed) Labs Reviewed  RAPID URINE DRUG SCREEN, HOSP PERFORMED - Abnormal; Notable for the following components:      Result Value   Benzodiazepines POSITIVE (*)    All other components within normal limits  ACETAMINOPHEN LEVEL - Abnormal; Notable for the following components:   Acetaminophen (Tylenol), Serum <10 (*)    All other components within normal limits  COMPREHENSIVE METABOLIC PANEL - Abnormal; Notable for the following components:   Sodium 147 (*)    Glucose, Bld 105 (*)    All other components within normal limits  ETHANOL - Abnormal; Notable for the following components:   Alcohol, Ethyl (B) 290 (*)    All other components within normal limits  SALICYLATE LEVEL  CBC WITH DIFFERENTIAL/PLATELET  BLOOD GAS, ARTERIAL    EKG  EKG Interpretation None       Radiology Dg Abdomen 1 View  Result Date: 11/06/2017 CLINICAL DATA:  ET tube and OG tube placement. EXAM: ABDOMEN - 1 VIEW COMPARISON:  None FINDINGS: There is a enteric tube with tip projecting over the body of the stomach. Side port well below GE junction. No dilated bowel loops noted. IMPRESSION: 1. Enteric tube tip projects over the body of the stomach. Electronically Signed    By: Kerby Moors M.D.   On: 11/06/2017 18:17   Dg Chest Portable 1 View  Result Date: 11/06/2017 CLINICAL DATA:  Endotracheal tube placement. Unresponsive status post overdose. EXAM: PORTABLE CHEST 1 VIEW COMPARISON:  No comparison studies available. FINDINGS: 1751 hours. Leftward patient rotation. Left costophrenic angle not included on the film. No evidence for focal airspace consolidation or pulmonary edema. No right pleural effusion and no substantial left pleural effusion evident. Endotracheal tube tip 3.8 cm above the base of the carina. The NG tube passes into the stomach although the distal tip position is not included  on the film. The visualized bony structures of the thorax are intact. IMPRESSION: Limited study with out acute cardiopulmonary findings. Endotracheal and NG tubes as above. Electronically Signed   By: Misty Stanley M.D.   On: 11/06/2017 18:18    Procedures Procedures (including critical care time)  CRITICAL CARE Performed by: Virgel Manifold Total critical care time: 40 minutes Critical care time was exclusive of separately billable procedures and treating other patients. Critical care was necessary to treat or prevent imminent or life-threatening deterioration. Critical care was time spent personally by me on the following activities: development of treatment plan with patient and/or surrogate as well as nursing, discussions with consultants, evaluation of patient's response to treatment, examination of patient, obtaining history from patient or surrogate, ordering and performing treatments and interventions, ordering and review of laboratory studies, ordering and review of radiographic studies, pulse oximetry and re-evaluation of patient's condition.  INTUBATION Performed by: Virgel Manifold  Required items: required blood products, implants, devices, and special equipment available Patient identity confirmed: provided demographic data and hospital-assigned identification  number Time out: Immediately prior to procedure a "time out" was called to verify the correct patient, procedure, equipment, support staff and site/side marked as required.  Indications: airway protection  Intubation method: Glidescope Laryngoscopy   Preoxygenation: BVM  Sedatives: Etomidate Paralytic: Succinylcholine  Tube Size: 7.5 cuffed  Post-procedure assessment: chest rise and ETCO2 monitor Breath sounds: equal and absent over the epigastrium Tube secured with: ETT holder Chest x-ray interpreted by radiologist and me.  Chest x-ray findings: endotracheal tube in appropriate position  Patient tolerated the procedure well with no immediate complications.      Medications Ordered in ED Medications  propofol (DIPRIVAN) 1000 MG/100ML infusion (not administered)  propofol (DIPRIVAN) 1000 MG/100ML infusion (not administered)  naloxone (NARCAN) injection 2 mg (2 mg Intravenous Given 11/06/17 1740)  sodium chloride 0.9 % bolus 1,000 mL (1,000 mLs Intravenous New Bag/Given 11/06/17 1801)  etomidate (AMIDATE) injection 30 mg (20 mg Intravenous Given 11/06/17 1802)  succinylcholine (ANECTINE) injection 100 mg (100 mg Intravenous Given 11/06/17 1802)     Initial Impression / Assessment and Plan / ED Course  I have reviewed the triage vital signs and the nursing notes.  Pertinent labs & imaging results that were available during my care of the patient were reviewed by me and considered in my medical decision making (see chart for details).     Reportedly intentional drug overdose.  Benzos and possibly also drinking alcohol.  She arrived with a GCS of 6. She was given 2 mg of Narcan without any clinical response. She was intubated for airway protection.  Final Clinical Impressions(s) / ED Diagnoses   Final diagnoses:  Benzodiazepine (tranquilizer) overdose, intentional self-harm, initial encounter [facility])    ED Discharge Orders    None       Virgel Manifold, MD 11/08/17  2321

## 2017-11-06 NOTE — Progress Notes (Signed)
CRITICAL VALUE ALERT  Critical Value:  Lactic acid 2.0  Date & Time Notied:  11/06/2017  1107pm   Provider Notified: yes  Orders Received/Actions taken: pending

## 2017-11-06 NOTE — ED Triage Notes (Signed)
All of our efforts thus far have been to assess and maintain pt's. Airway, which ultimately led to intubation when Narcan showed no appreciable effect. Also, I have just applied the [facility] hugger warming device. All requisite confirmatory x-rays were also taken. Further, we have inserted a temperature-sensing foley. Monitor shows nsr without ectopy.

## 2017-11-06 NOTE — ED Notes (Signed)
Spoke with poison control, does not need to continue case at this time.

## 2017-11-06 NOTE — ED Notes (Signed)
Without my consent or advice, our Surveyor, quantity, Abigail Butts,  brought in pt's. "roommate" and her "work Librarian, academic" (non-family members) to see pt. And she spoke with them in the room at length.

## 2017-11-06 NOTE — ED Triage Notes (Signed)
Her "roommate" found pt. unconscious on the floor; and she was found with a bottle of 0.5mg  Xanax (#60), four of which were reportedly taken by her "friend", to whom they were prescribed; ostensibly pt. Took the other 56 tablets. This was reported to have happened about an hour p.t.a.

## 2017-11-06 NOTE — ED Notes (Signed)
This Probation officer was asked by Dr Wilson Singer to speak to roommate and patients boss who were in the waiting room to try to obtain a family contact.  They advised patient is estranged from mother and has no other family. Her boss looked through HR files and she had no emergency contact listed.   Dr. Wilson Singer said it would be ok for her boss and roommate to see her briefly.  I spoke to charge [employer], who agreed.  No medical information was shared with visitors by this Probation officer.

## 2017-11-06 NOTE — ED Notes (Signed)
Pts roommate Adam provided this Probation officer with the name of patients biological father Preston Fleeting (he obtained from her cell phone). This writer left a message for Mr Risa Grill to call the ICU in regards to a family member. ICU number given on his VM.

## 2017-11-07 ENCOUNTER — Encounter (HOSPITAL_COMMUNITY): Payer: Self-pay | Admitting: *Deleted

## 2017-11-07 ENCOUNTER — Other Ambulatory Visit: Payer: Self-pay

## 2017-11-07 ENCOUNTER — Inpatient Hospital Stay (HOSPITAL_COMMUNITY): Payer: 59

## 2017-11-07 DIAGNOSIS — T50904A Poisoning by unspecified drugs, medicaments and biological substances, undetermined, initial encounter: Secondary | ICD-10-CM

## 2017-11-07 LAB — CBC
HEMATOCRIT: 36.8 % (ref 36.0–46.0)
HEMOGLOBIN: 12 g/dL (ref 12.0–15.0)
MCH: 31.8 pg (ref 26.0–34.0)
MCHC: 32.6 g/dL (ref 30.0–36.0)
MCV: 97.6 fL (ref 78.0–100.0)
Platelets: 209 10*3/uL (ref 150–400)
RBC: 3.77 MIL/uL — ABNORMAL LOW (ref 3.87–5.11)
RDW: 13 % (ref 11.5–15.5)
WBC: 8.7 10*3/uL (ref 4.0–10.5)

## 2017-11-07 LAB — GLUCOSE, CAPILLARY
GLUCOSE-CAPILLARY: 84 mg/dL (ref 65–99)
GLUCOSE-CAPILLARY: 100 mg/dL — AB (ref 65–99)
GLUCOSE-CAPILLARY: 136 mg/dL — AB (ref 65–99)
Glucose-Capillary: 92 mg/dL (ref 65–99)
Glucose-Capillary: 93 mg/dL (ref 65–99)
Glucose-Capillary: 101 mg/dL — ABNORMAL HIGH (ref 65–99)

## 2017-11-07 LAB — BASIC METABOLIC PANEL
Anion gap: 9 (ref 5–15)
BUN: 9 mg/dL (ref 6–20)
CALCIUM: 8 mg/dL — AB (ref 8.9–10.3)
CO2: 21 mmol/L — AB (ref 22–32)
Chloride: 116 mmol/L — ABNORMAL HIGH (ref 101–111)
Creatinine, Ser: 0.51 mg/dL (ref 0.44–1.00)
GFR calc Af Amer: >60 mL/min (ref >60)
GFR calc non Af Amer: >60 mL/min (ref >60)
GLUCOSE: 84 mg/dL (ref 65–99)
Potassium: 3.2 mmol/L — ABNORMAL LOW (ref 3.5–5.1)
Sodium: 146 mmol/L — ABNORMAL HIGH (ref 135–145)

## 2017-11-07 LAB — BLOOD GAS, ARTERIAL
ACID-BASE DEFICIT: 3 mmol/L — AB (ref 0.0–2.0)
BICARBONATE: 21.9 mmol/L (ref 20.0–28.0)
Drawn by: 11249
FIO2: 40
LHR: 14 {breaths}/min
O2 SAT: 97.5 %
PATIENT TEMPERATURE: 37.6
PCO2 ART: 42.1 mmHg (ref 32.0–48.0)
PEEP/CPAP: 5 cmH2O
PH ART: 7.34 — AB (ref 7.350–7.450)
VT: 550 mL
pO2, Arterial: 117 mmHg — ABNORMAL HIGH (ref 83.0–108.0)

## 2017-11-07 LAB — PROCALCITONIN: Procalcitonin: <0.1 ng/mL

## 2017-11-07 LAB — MAGNESIUM: MAGNESIUM: 1.6 mg/dL — AB (ref 1.7–2.4)

## 2017-11-07 LAB — OSMOLALITY: Osmolality: 343 mOsm/kg (ref 275–295)

## 2017-11-07 LAB — HIV ANTIBODY (ROUTINE TESTING W REFLEX): HIV Screen 4th Generation wRfx: NONREACTIVE

## 2017-11-07 LAB — PHOSPHORUS: Phosphorus: 3.1 mg/dL (ref 2.5–4.6)

## 2017-11-07 MED ORDER — POTASSIUM CHLORIDE 20 MEQ/15ML (10%) PO SOLN
40.0000 meq | Freq: Once | ORAL | Status: AC
  Administered 2017-11-07: 40 meq
  Filled 2017-11-07: qty 30

## 2017-11-07 MED ORDER — SODIUM CHLORIDE 0.9 % IV BOLUS (SEPSIS)
1000.0000 mL | Freq: Once | INTRAVENOUS | Status: AC
  Administered 2017-11-07: 1000 mL via INTRAVENOUS

## 2017-11-07 MED ORDER — MAGNESIUM SULFATE IN D5W 1-5 GM/100ML-% IV SOLN
1.0000 g | Freq: Once | INTRAVENOUS | Status: AC
  Administered 2017-11-07: 1 g via INTRAVENOUS
  Filled 2017-11-07: qty 100

## 2017-11-07 NOTE — Progress Notes (Addendum)
.. ..  Name: Crystal Williamson MRN: [id number] DOB: [birth year]    ADMISSION DATE:  11/06/2017 CONSULTATION DATE:  11/06/17  REFERRING MD :  Wilson Singer MD  CHIEF COMPLAINT:  ALTERED MENTAL STATUS  BRIEF PATIENT DESCRIPTION: 52 yr old female with PMHx Depression suspected ingestion of 0.5mg  Xanax (56 tabs) found unconscious, now intubated.  SIGNIFICANT EVENTS  Altered mental status ?Suicidal attempt Intubated  STUDIES:  CXR, ABG   HISTORY OF PRESENT ILLNESS:  52 yr old female with PMHx Depression, presents to [facility] post intentional OD with Xanax.  Per ED Physician note Xanax was prescribed to her friend. Pt reportedly took the remaining 56 tabs in the bottle along with alcohol. On presentation to the ED she was unconscious but protecting her airway. Pt received Narcan but remained GCS 6 and decision was made to intubate. There is no evidence in the history to suggest opiate use. UDS collected is positive for Benzodiazepines and ETOH level >200. Acteaminophen, Salicylates were negative.   PAST MEDICAL HISTORY :   has a past medical history of Allergy, Depression, Fibroid, Headache(784.0), No pertinent past medical history, and Varicose veins.  has a past surgical history that includes Septoplasty; Wisdom tooth extraction; Abdominal hysterectomy (06/01/2012); and Varicose vein surgery. Prior to Admission medications   Medication Sig Start Date End Date Taking? Authorizing Provider  CA & PHOS-VIT D-MAG PO Take 3 tablets by mouth at bedtime.    [provider]  cyclobenzaprine (FLEXERIL) 5 MG tablet Take 1 tablet (5 mg total) by mouth 3 (three) times daily as needed. 07/16/16   [name], MD  doxycycline (ORACEA) 40 MG capsule Take 1 capsule (40 mg total) by mouth 2 (two) times daily. 11/26/16   [name], MD  estradiol (ESTRACE) 1 MG tablet Take 1 tablet (1 mg total) daily by mouth. 08/03/17   Princess Bruins, MD  metroNIDAZOLE (METROGEL) 1 % gel Apply topically daily.  Reported on 02/13/2016    [provider]  Milk Thistle 200 MG CAPS Take 200-400 mg by mouth daily.    [provider]  OVER THE COUNTER MEDICATION Reported on 02/13/2016    [provider]  Probiotic Product (PROBIOTIC DAILY PO) Take 1 capsule by mouth.    [provider]   Allergies  Allergen Reactions  . Codeine Itching    REACTION: N \\T \ V  . Hydromorphone Itching  . Opium   . Percocet [Oxycodone-Acetaminophen] Itching    FAMILY HISTORY:  family history includes Breast cancer in her paternal grandmother; Cancer in her maternal grandfather and paternal grandfather; Colon cancer in her paternal grandfather; Diabetes in her maternal grandfather and paternal grandfather; Lumbar disc disease in her mother; Lupus in her mother; Stroke in her maternal grandmother; Varicose Veins in her mother. SOCIAL HISTORY:  reports that she has been smoking cigarettes.  She has been smoking about 0.25 packs per day. she has never used smokeless tobacco. She reports that she drinks alcohol. She reports that she does not use drugs.  REVIEW OF SYSTEMS:  Unable to obtain as patient is intubated  SUBJECTIVE:  Awake today.  Agitated on weaning trial.  VITAL SIGNS: Temp:  [95 F (35 C)-99.9 F (37.7 C)] 99.5 F (37.5 C) (02/17 0800) Pulse Rate:  [60-90] 85 (02/17 0800) Resp:  [14-27] 14 (02/17 0800) BP: (92-137)/(48-96) 126/64 (02/17 0800) SpO2:  [94 %-100 %] 99 % (02/17 0806) FiO2 (%):  [40 %-100 %] 40 % (02/17 0815) Weight:  [189 lb 13.1 oz (86.1  kg)-216 lb 0.8 oz (98 kg)] 189 lb 13.1 oz (86.1 kg) (02/17 0500)  PHYSICAL EXAMINATION: Gen:      No acute distress HEENT:  EOMI, sclera anicteric, ETT Neck:     No masses; no thyromegaly Lungs:    Clear to auscultation bilaterally; normal respiratory effort CV:         Regular rate and rhythm; no murmurs Abd:      + bowel sounds; soft, non-tender; no palpable masses, no distension Ext:    No edema; adequate peripheral  perfusion Skin:      Warm and dry; no rash Neuro: Awake, agitated.  Moves all extremities & follows commands.  No focal deficits.  Recent Labs  Lab 11/06/17 1752 11/07/17 0325  NA 147* 146*  K 3.6 3.2*  CL 111 116*  CO2 26 21*  BUN 8 9  CREATININE 0.49 0.51  GLUCOSE 105* 84   Recent Labs  Lab 11/06/17 1752 11/07/17 0325  HGB 14.2 12.0  HCT 41.7 36.8  WBC 6.9 8.7  PLT 228 209   Dg Abdomen 1 View  Result Date: 11/06/2017 CLINICAL DATA:  ET tube and OG tube placement. EXAM: ABDOMEN - 1 VIEW COMPARISON:  None FINDINGS: There is a enteric tube with tip projecting over the body of the stomach. Side port well below GE junction. No dilated bowel loops noted. IMPRESSION: 1. Enteric tube tip projects over the body of the stomach. Electronically Signed   By: Kerby Moors M.D.   On: 11/06/2017 18:17   Ct Head Wo Contrast  Result Date: 11/06/2017 CLINICAL DATA:  Found down. Possible overdose. Possible head trauma. Initial encounter. EXAM: CT HEAD WITHOUT CONTRAST TECHNIQUE: Contiguous axial images were obtained from the base of the skull through the vertex without intravenous contrast. COMPARISON:  None. FINDINGS: Brain: There is no evidence of acute infarct, intracranial hemorrhage, mass, midline shift, or extra-axial fluid collection. The ventricles and sulci are normal. Vascular: No hyperdense vessel. Skull: No fracture.  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 Sinuses/Orbits: Trace left sphenoid sinus fluid. Trace bilateral maxillary sinus mucosal thickening. Trace left mastoid effusion. Unremarkable orbits. Other: Mild right scalp swelling. IMPRESSION: 1. No evidence of acute intracranial abnormality. Unremarkable CT appearance of the brain. 2. Right scalp swelling. Electronically Signed   By: Logan Bores M.D.   On: 11/06/2017 21:11   Ct Soft Tissue Neck Wo Contrast  Result Date: 11/06/2017 CLINICAL DATA:  Found down. Possible overdose. Possible soft tissue hematoma in the neck. Initial  encounter. EXAM: CT NECK WITHOUT CONTRAST TECHNIQUE: Multidetector CT imaging of the neck was performed following the standard protocol without intravenous contrast. COMPARISON:  None. FINDINGS: Pharynx and larynx: Incompletely visualized enteric tube. Endotracheal tube terminates above the carina. Retained secretions in the posterior nasal cavity and pharynx. No parapharyngeal or retropharyngeal fluid collection or significant inflammatory change. Salivary glands: No inflammation, mass, or stone. Thyroid: Unremarkable. Lymph nodes: No enlarged or suspicious lymph nodes in the neck. Vascular: Unremarkable noncontrast appearance. Limited intracranial: Unremarkable. Visualized orbits: Unremarkable. Mastoids and visualized paranasal sinuses: Minimal mucosal thickening in the maxillary sinuses. Trace left sphenoid sinus fluid. Rightward nasal septal deviation. Trace left mastoid effusion. Skeleton: Incomplete posterior C1 fusion, a normal variant. No acute fracture. C5-6 disc degeneration with moderate disc space narrowing and endplate spurring. Moderate to advanced left facet arthrosis at C3-4 and C4-5. Upper chest: Clear lung apices. Other: No sizable soft tissue hematoma identified in the neck. IMPRESSION: No hematoma or other acute abnormality identified in the neck.  Electronically Signed   By: Logan Bores M.D.   On: 11/06/2017 21:08   Dg Chest Port 1 View  Result Date: 11/07/2017 CLINICAL DATA:  Ventilator dependent. EXAM: PORTABLE CHEST 1 VIEW COMPARISON:  Radiograph yesterday. FINDINGS: Tip of the endotracheal tube at the thoracic inlet. Enteric tube in place below the diaphragm. Normal heart size and mediastinal contours. Developing left infrahilar atelectasis. No confluent consolidation. No pulmonary edema, large pneumothorax or pleural effusion. IMPRESSION: Developing left lung base atelectasis. Electronically Signed   By: Jeb Levering M.D.   On: 11/07/2017 05:28   Dg Chest Portable 1 View  Result  Date: 11/06/2017 CLINICAL DATA:  Endotracheal tube placement. Unresponsive status post overdose. EXAM: PORTABLE CHEST 1 VIEW COMPARISON:  No comparison studies available. FINDINGS: 1751 hours. Leftward patient rotation. Left costophrenic angle not included on the film. No evidence for focal airspace consolidation or pulmonary edema. No right pleural effusion and no substantial left pleural effusion evident. Endotracheal tube tip 3.8 cm above the base of the carina. The NG tube passes into the stomach although the distal tip position is not included on the film. The visualized bony structures of the thorax are intact. IMPRESSION: Limited study with out acute cardiopulmonary findings. Endotracheal and NG tubes as above. Electronically Signed   By: Misty Stanley M.D.   On: 11/06/2017 18:18    ASSESSMENT / PLAN: 52 year old with severe depression admitted after intentional overdose with Xanax.  High EtOH levels.  PULMONARY: Intubated for Increased Aspiration Risk due to Metabolic Encephalopathy Weaning well Plan on extubation  PSYCH, NEURO Severe Depression, suicide attempt - secondary to loss of spouse to Brain Cancer in May 2017 Encephalopathy secondary to Xanax overdose, alcohol abuse Bedside sitter, psychiatry evaluation after extubation Monitor for alcohol withdrawal Thiamine and Folic Acid ordered CT head and neck shows rt scalp swelling No intracranial or abnormality  CARDIAC: Hemodynamically stable Telemetry monitoring  ID: No evidence of an active infection Trend WBC, fever curve and lactate Observe off antibiotics  GI: NPO PPI for prophylaxis.  Gyn: S/p partial Hysterectomy with Ovarian preservation Secondary to 16 week size leiomyomata  Heme: Stable Follow CBC  RENAL Low K, magnesium Replete lytes  The patient is critically ill with multiple organ system failure and requires high complexity decision making for assessment and support, frequent evaluation and  titration of therapies, advanced monitoring, review of radiographic studies and interpretation of complex data.   Critical Care Time devoted to patient care services, exclusive of separately billable procedures, described in this note is 35 minutes.   Marshell Garfinkel MD [facility] Pager [phone] If no answer or after 3pm call: [phone] 11/07/2017, 8:54 AM

## 2017-11-07 NOTE — Progress Notes (Signed)
Bladder scanned patient after patient voided only 60 cc in [facility].  Bladder scan showed 120-153 cc present in bladder currently.  Continue to monitor patient closely.  Sitter at bedside.  Saki Legore Roselie Awkward RN

## 2017-11-07 NOTE — Progress Notes (Signed)
CRITICAL VALUE ALERT  Critical Value:  Serum osmolarity 343  Date & Time Notied:  12:45 11/07/2017  Provider Notified: Dr. Vaughan Browner  Orders Received/Actions taken:  No new orders currently

## 2017-11-07 NOTE — Procedures (Signed)
Extubation Procedure Note  Patient Details:   Name: Anyelina Claycomb Eddings DOB: [birth year] MRN: [id number]   Airway Documentation:     Evaluation  O2 sats: 99 Complications: none Patient tolerated procedure well. Bilateral Breath Sounds: Clear, Diminished   Able to speak  Per CCM order, pt extubated and placed on 3L nasal cannula.  Pt tolerated well with no complications.  Martha Clan 11/07/2017, 9:11 AM

## 2017-11-07 NOTE — Progress Notes (Signed)
CSW confirmed with RN Amy that patient has been served IVC paperwork.  Laveda Abbe, LCSW Clinical Social Worker [phone] (Weekend Coverage)

## 2017-11-07 NOTE — Progress Notes (Signed)
eLink Physician-Brief Progress Note Patient Name: Crystal Williamson DOB: [birth year] MRN: [id number]   Date of Service  11/07/2017  HPI/Events of Note  Lactic Acid = 2.0.   eICU Interventions  Will bolus with 0.9 NaCl 1 liter IV over 1 hour now.      Intervention Category Major Interventions: Acid-Base disturbance - evaluation and management  Brittney Caraway Eugene 11/07/2017, 12:04 AM

## 2017-11-07 NOTE — Progress Notes (Signed)
Report given to Poison control they are signing off from care, call if needed.

## 2017-11-07 NOTE — Progress Notes (Addendum)
CSW consulted by MD due to patient intentional overdose, and contacted by RN to complete IVC paperwork. CSW completed paperwork and sent to MD to sign. CSW faxed completed paperwork to Magistrate's office for patient to be served.  CSW will continue to follow to ensure patient is served, as well as for any disposition needs after psychiatry is able to see the patient.  Laveda Abbe, LCSW Clinical Social Worker [phone] (Weekend coverage)   UPDATE 3:00 PM:  CSW went to confirm that IVC paperwork had been served: not on chart. CSW contacted Magistrate's office to confirm that paperwork had been received. Magistrate's office confirmed receipt of paperwork, and CSW confirmed patient's room number.   CSW will continue to follow.  Laveda Abbe, [place] Clinical Social Worker [phone]

## 2017-11-08 ENCOUNTER — Inpatient Hospital Stay (HOSPITAL_COMMUNITY): Payer: 59

## 2017-11-08 DIAGNOSIS — T1491XA Suicide attempt, initial encounter: Secondary | ICD-10-CM

## 2017-11-08 DIAGNOSIS — J9600 Acute respiratory failure, unspecified whether with hypoxia or hypercapnia: Secondary | ICD-10-CM

## 2017-11-08 DIAGNOSIS — Z634 Disappearance and death of family member: Secondary | ICD-10-CM

## 2017-11-08 DIAGNOSIS — F341 Dysthymic disorder: Secondary | ICD-10-CM

## 2017-11-08 DIAGNOSIS — Z566 Other physical and mental strain related to work: Secondary | ICD-10-CM

## 2017-11-08 DIAGNOSIS — T424X2A Poisoning by benzodiazepines, intentional self-harm, initial encounter: Principal | ICD-10-CM

## 2017-11-08 DIAGNOSIS — Z564 Discord with boss and workmates: Secondary | ICD-10-CM

## 2017-11-08 DIAGNOSIS — Y908 Blood alcohol level of 240 mg/100 ml or more: Secondary | ICD-10-CM

## 2017-11-08 DIAGNOSIS — Z638 Other specified problems related to primary support group: Secondary | ICD-10-CM

## 2017-11-08 DIAGNOSIS — T5192XA Toxic effect of unspecified alcohol, intentional self-harm, initial encounter: Secondary | ICD-10-CM

## 2017-11-08 LAB — CBC
HCT: 35.8 % — ABNORMAL LOW (ref 36.0–46.0)
Hemoglobin: 12.2 g/dL (ref 12.0–15.0)
MCH: 33.1 pg (ref 26.0–34.0)
MCHC: 34.1 g/dL (ref 30.0–36.0)
MCV: 97 fL (ref 78.0–100.0)
Platelets: 214 10*3/uL (ref 150–400)
RBC: 3.69 MIL/uL — AB (ref 3.87–5.11)
RDW: 12.9 % (ref 11.5–15.5)
WBC: 8.9 10*3/uL (ref 4.0–10.5)

## 2017-11-08 LAB — MAGNESIUM: MAGNESIUM: 2 mg/dL (ref 1.7–2.4)

## 2017-11-08 LAB — BASIC METABOLIC PANEL
ANION GAP: 5 (ref 5–15)
BUN: 12 mg/dL (ref 6–20)
CALCIUM: 8.3 mg/dL — AB (ref 8.9–10.3)
CO2: 27 mmol/L (ref 22–32)
Chloride: 109 mmol/L (ref 101–111)
Creatinine, Ser: 0.52 mg/dL (ref 0.44–1.00)
GFR calc non Af Amer: >60 mL/min (ref >60)
Glucose, Bld: 113 mg/dL — ABNORMAL HIGH (ref 65–99)
POTASSIUM: 3.5 mmol/L (ref 3.5–5.1)
SODIUM: 141 mmol/L (ref 135–145)

## 2017-11-08 LAB — PHOSPHORUS: PHOSPHORUS: 3.6 mg/dL (ref 2.5–4.6)

## 2017-11-08 LAB — GLUCOSE, CAPILLARY: GLUCOSE-CAPILLARY: 138 mg/dL — AB (ref 65–99)

## 2017-11-08 MED ORDER — LORAZEPAM 1 MG PO TABS: 1.0000 mg | ORAL_TABLET | Freq: Four times a day (QID) | ORAL | Status: DC | PRN

## 2017-11-08 MED ORDER — VITAMIN B-1 100 MG PO TABS
100.0000 mg | ORAL_TABLET | Freq: Every day | ORAL | Status: DC
  Administered 2017-11-08 – 2017-11-09 (×2): 100 mg via ORAL
  Filled 2017-11-08 (×2): qty 1

## 2017-11-08 MED ORDER — FOLIC ACID 1 MG PO TABS
1.0000 mg | ORAL_TABLET | Freq: Every day | ORAL | Status: DC
  Administered 2017-11-09: 1 mg via ORAL
  Filled 2017-11-08: qty 1

## 2017-11-08 MED ORDER — THIAMINE HCL 100 MG/ML IJ SOLN: 100.0000 mg | Freq: Every day | INTRAMUSCULAR | Status: DC

## 2017-11-08 MED ORDER — ADULT MULTIVITAMIN W/MINERALS CH
1.0000 | ORAL_TABLET | Freq: Every day | ORAL | Status: DC
  Administered 2017-11-08 – 2017-11-09 (×2): 1 via ORAL
  Filled 2017-11-08 (×2): qty 1

## 2017-11-08 MED ORDER — LORAZEPAM 2 MG/ML IJ SOLN: 1.0000 mg | Freq: Four times a day (QID) | INTRAMUSCULAR | Status: DC | PRN

## 2017-11-08 MED ORDER — ACETAMINOPHEN 325 MG PO TABS
650.0000 mg | ORAL_TABLET | Freq: Four times a day (QID) | ORAL | Status: DC | PRN
  Administered 2017-11-08 – 2017-11-09 (×5): 650 mg via ORAL
  Filled 2017-11-08 (×5): qty 2

## 2017-11-08 MED ORDER — PHENOL 1.4 % MT LIQD
1.0000 | OROMUCOSAL | Status: DC | PRN
  Filled 2017-11-08: qty 177

## 2017-11-08 MED ORDER — NICOTINE 14 MG/24HR TD PT24
14.0000 mg | MEDICATED_PATCH | Freq: Every day | TRANSDERMAL | Status: DC
  Administered 2017-11-08 – 2017-11-09 (×2): 14 mg via TRANSDERMAL
  Filled 2017-11-08 (×2): qty 1

## 2017-11-08 NOTE — Progress Notes (Signed)
PROGRESS NOTE    Crystal Williamson  JEH:[id number] DOB: 28-Jan-1966 DOA: 11/06/2017 PCP: [name], MD   Outpatient Specialists:     Brief Narrative:  52 yr old female with PMHx Depression suspected ingestion of 0.5mg  Xanax (56 tabs) found unconscious, had to be intubated.  Now extubated and has been transferred to [facility] for psych evaluation.      Assessment & Plan:   Active Problems:   Overdose   intentional overdose of 28mg  xanax -s/p extubation -poison control signed off -sitter at bedside  H/o alcohol abuse -CIWA protocol  Severe depression -psych eval  Hypernatremia -resolved   DVT prophylaxis:  SCD's  Code Status: Full Code   Family Communication:   Disposition Plan:     Consultants:  PCCM psych  Subjective: Throat sore, says she doesn't remember what happened  Objective: Vitals:   11/08/17 0900 11/08/17 1000 11/08/17 1150 11/08/17 1200  BP:  (!) 108/45 (!) 128/59   Pulse: 78 74 87 84  Resp: 19 13 15 18   Temp:   98.6 F (37 C)   TempSrc:   Oral   SpO2: 96% 98% 99% 98%  Weight:      Height:        Intake/Output Summary (Last 24 hours) at 11/08/2017 1230 Last data filed at 11/08/2017 1200 Gross per 24 hour  Intake 4090 ml  Output 980 ml  Net 3110 ml   Filed Weights   11/06/17 2124 11/07/17 0500 11/08/17 0510  Weight: 97.5 kg (214 lb 15.2 oz) 86.1 kg (189 lb 13.1 oz) 85.4 kg (188 lb 4.4 oz)    Examination:  General exam: voice muffled Respiratory system: Clear to auscultation. Respiratory effort normal. Cardiovascular system: S1 & S2 heard, RRR. No JVD, murmurs, rubs, gallops or clicks. No pedal edema. Gastrointestinal system: Abdomen is nondistended, soft and nontender. No organomegaly or masses felt. Normal bowel sounds heard. Central nervous system: Alert and oriented. No focal neurological deficits. Extremities: Symmetric 5 x 5 power. Skin: No rashes, lesions or ulcers     Data Reviewed: I have personally reviewed  following labs and imaging studies  CBC: Recent Labs  Lab 11/06/17 1752 11/07/17 0325 11/08/17 0353  WBC 6.9 8.7 8.9  NEUTROABS 4.2  --   --   HGB 14.2 12.0 12.2  HCT 41.7 36.8 35.8*  MCV 96.1 97.6 97.0  PLT 228 209 378   Basic Metabolic Panel: Recent Labs  Lab 11/06/17 1752 11/07/17 0325 11/08/17 0353  NA 147* 146* 141  K 3.6 3.2* 3.5  CL 111 116* 109  CO2 26 21* 27  GLUCOSE 105* 84 113*  BUN 8 9 12   CREATININE 0.49 0.51 0.52  CALCIUM 9.2 8.0* 8.3*  MG  --  1.6* 2.0  PHOS  --  3.1 3.6   GFR: Estimated Creatinine Clearance: 101.7 mL/min (by C-G formula based on SCr of 0.52 mg/dL). Liver Function Tests: Recent Labs  Lab 11/06/17 1752  AST 27  ALT 25  ALKPHOS 57  BILITOT 0.7  PROT 7.0  ALBUMIN 4.3   No results for input(s): LIPASE, AMYLASE in the last 168 hours. No results for input(s): AMMONIA in the last 168 hours. Coagulation Profile: No results for input(s): INR, PROTIME in the last 168 hours. Cardiac Enzymes: Recent Labs  Lab 11/06/17 2142  CKTOTAL 124   BNP (last 3 results) No results for input(s): PROBNP in the last 8760 hours. HbA1C: No results for input(s): HGBA1C in the last 72 hours. CBG: Recent Labs  Lab 11/07/17 1208 11/07/17 1614 11/07/17 1952 11/07/17 2300 11/08/17 0308  GLUCAP 92 93 101* 136* 138*   Lipid Profile: Recent Labs    11/06/17 2142  TRIG 83   Thyroid Function Tests: No results for input(s): TSH, T4TOTAL, FREET4, T3FREE, THYROIDAB in the last 72 hours. Anemia Panel: No results for input(s): VITAMINB12, FOLATE, FERRITIN, TIBC, IRON, RETICCTPCT in the last 72 hours. Urine analysis:    Component Value Date/Time   COLORURINE YELLOW 11/14/2013 [place] 11/14/2013 1239   LABSPEC 1.009 11/14/2013 1239   PHURINE 7.0 11/14/2013 1239   GLUCOSEU NEG 11/14/2013 1239   HGBUR NEG 11/14/2013 1239   HGBUR negative 06/14/2009 0924   BILIRUBINUR negative 07/16/2016 1629   BILIRUBINUR neg 04/27/2013 1333     KETONESUR negative 07/16/2016 1629   KETONESUR NEG 11/14/2013 1239   PROTEINUR negative 07/16/2016 1629   PROTEINUR NEG 11/14/2013 1239   UROBILINOGEN 0.2 07/16/2016 1629   UROBILINOGEN 0.2 11/14/2013 1239   NITRITE Negative 07/16/2016 1629   NITRITE NEG 11/14/2013 1239   LEUKOCYTESUR Negative 07/16/2016 1629    Recent Results (from the past 240 hour(s))  MRSA PCR Screening     Status: None   Collection Time: 11/06/17  9:26 PM  Result Value Ref Range Status   MRSA by PCR NEGATIVE NEGATIVE Final    Comment:        The GeneXpert MRSA Assay (FDA approved for NASAL specimens only), is one component of a comprehensive MRSA colonization surveillance program. It is not intended to diagnose MRSA infection nor to guide or monitor treatment for MRSA infections. Performed at [facility], [place] [address] [place], [place] [address]       Anti-infectives (From admission, onward)   None       Radiology Studies: Dg Abdomen 1 View  Result Date: 11/06/2017 CLINICAL DATA:  ET tube and OG tube placement. EXAM: ABDOMEN - 1 VIEW COMPARISON:  None FINDINGS: There is a enteric tube with tip projecting over the body of the stomach. Side port well below GE junction. No dilated bowel loops noted. IMPRESSION: 1. Enteric tube tip projects over the body of the stomach. Electronically Signed   By: Kerby Moors M.D.   On: 11/06/2017 18:17   Ct Head Wo Contrast  Result Date: 11/06/2017 CLINICAL DATA:  Found down. Possible overdose. Possible head trauma. Initial encounter. EXAM: CT HEAD WITHOUT CONTRAST TECHNIQUE: Contiguous axial images were obtained from the base of the skull through the vertex without intravenous contrast. COMPARISON:  None. FINDINGS: Brain: There is no evidence of acute infarct, intracranial hemorrhage, mass, midline shift, or extra-axial fluid collection. The ventricles and sulci are normal. Vascular: No hyperdense vessel. Skull: No fracture.  1.5 cm left  frontal skull osteoma. Sinuses/Orbits: Trace left sphenoid sinus fluid. Trace bilateral maxillary sinus mucosal thickening. Trace left mastoid effusion. Unremarkable orbits. Other: Mild right scalp swelling. IMPRESSION: 1. No evidence of acute intracranial abnormality. Unremarkable CT appearance of the brain. 2. Right scalp swelling. Electronically Signed   By: Logan Bores M.D.   On: 11/06/2017 21:11   Ct Soft Tissue Neck Wo Contrast  Result Date: 11/06/2017 CLINICAL DATA:  Found down. Possible overdose. Possible soft tissue hematoma in the neck. Initial encounter. EXAM: CT NECK WITHOUT CONTRAST TECHNIQUE: Multidetector CT imaging of the neck was performed following the standard protocol without intravenous contrast. COMPARISON:  None. FINDINGS: Pharynx and larynx: Incompletely visualized enteric tube. Endotracheal tube terminates above the carina. Retained secretions in the posterior  nasal cavity and pharynx. No parapharyngeal or retropharyngeal fluid collection or significant inflammatory change. Salivary glands: No inflammation, mass, or stone. Thyroid: Unremarkable. Lymph nodes: No enlarged or suspicious lymph nodes in the neck. Vascular: Unremarkable noncontrast appearance. Limited intracranial: Unremarkable. Visualized orbits: Unremarkable. Mastoids and visualized paranasal sinuses: Minimal mucosal thickening in the maxillary sinuses. Trace left sphenoid sinus fluid. Rightward nasal septal deviation. Trace left mastoid effusion. Skeleton: Incomplete posterior C1 fusion, a normal variant. No acute fracture. C5-6 disc degeneration with moderate disc space narrowing and endplate spurring. Moderate to advanced left facet arthrosis at C3-4 and C4-5. Upper chest: Clear lung apices. Other: No sizable soft tissue hematoma identified in the neck. IMPRESSION: No hematoma or other acute abnormality identified in the neck. Electronically Signed   By: Logan Bores M.D.   On: 11/06/2017 21:08   Dg Chest Port 1  View  Result Date: 11/08/2017 CLINICAL DATA:  Respiratory failure.  Status postextubation EXAM: PORTABLE CHEST 1 VIEW COMPARISON:  November 07, 2017 FINDINGS: Endotracheal tube and nasogastric tube have been removed. No pneumothorax. There is no appreciable edema or consolidation. Heart is borderline enlarged with pulmonary vascularity within normal limits. No adenopathy. No appreciable bone lesions. IMPRESSION: No pneumothorax. No edema or consolidation. Stable cardiac prominence. Electronically Signed   By: Lowella Grip III M.D.   On: 11/08/2017 07:06   Dg Chest Port 1 View  Result Date: 11/07/2017 CLINICAL DATA:  Ventilator dependent. EXAM: PORTABLE CHEST 1 VIEW COMPARISON:  Radiograph yesterday. FINDINGS: Tip of the endotracheal tube at the thoracic inlet. Enteric tube in place below the diaphragm. Normal heart size and mediastinal contours. Developing left infrahilar atelectasis. No confluent consolidation. No pulmonary edema, large pneumothorax or pleural effusion. IMPRESSION: Developing left lung base atelectasis. Electronically Signed   By: Jeb Levering M.D.   On: 11/07/2017 05:28   Dg Chest Portable 1 View  Result Date: 11/06/2017 CLINICAL DATA:  Endotracheal tube placement. Unresponsive status post overdose. EXAM: PORTABLE CHEST 1 VIEW COMPARISON:  No comparison studies available. FINDINGS: 1751 hours. Leftward patient rotation. Left costophrenic angle not included on the film. No evidence for focal airspace consolidation or pulmonary edema. No right pleural effusion and no substantial left pleural effusion evident. Endotracheal tube tip 3.8 cm above the base of the carina. The NG tube passes into the stomach although the distal tip position is not included on the film. The visualized bony structures of the thorax are intact. IMPRESSION: Limited study with out acute cardiopulmonary findings. Endotracheal and NG tubes as above. Electronically Signed   By: Misty Stanley M.D.   On:  11/06/2017 18:18        Scheduled Meds: . folic acid  1 mg Intravenous Daily  . heparin  5,000 Units Subcutaneous Q8H  . pantoprazole (PROTONIX) IV  40 mg Intravenous QHS  . thiamine injection  100 mg Intravenous Daily   Continuous Infusions: . sodium chloride    . lactated ringers 75 mL/hr at 11/08/17 1200     LOS: 2 days    Time spent: 35 min    Geradine Girt, DO [facility] Pager [phone]  If 7PM-7AM, please contact night-coverage [email or web address] Password [facility] 11/08/2017, 12:30 PM

## 2017-11-08 NOTE — Consult Note (Signed)
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 and sitting up in a chair. NAD.  Eye Contact:  Good  Speech:  Clear and Coherent and Normal Rate  Volume:  Normal  Mood:  "I'm tired. I just couldn't take anymore stress and had to stop it."  Affect:  Constricted   Thought Process:  Goal Directed, Linear and Descriptions of Associations: Intact  Orientation:  Full (Time, Place, and Person)  Thought  Content:  Logical  Suicidal Thoughts:  No  Homicidal Thoughts:  No  Memory:  Immediate;   Good Recent;   Good Remote;   Good  Judgement:  Fair  Insight:  Fair  Psychomotor Activity:  Normal  Concentration:  Concentration: Good and Attention Span: Good  Recall:  Good  Fund of Knowledge:  Good  Language:  Good  Akathisia:  No  Handed:  Right  AIMS (if indicated):   N/A  Assets:  Agricultural consultant Housing Social Support Transportation  ADL's:  Intact  Cognition:  WNL  Sleep:   Okay   Assessment:  Crystal Williamson is a 52 y.o. female who was admitted after overdosing on Xanax in the setting of alcohol use. She reports depressed mood for the past 2 years after the death of her husband to brain cancer. She denies suicidal thoughts prior to the day of her suicide attempt although it appears as though it was planned. She warrants inpatient psychiatric hospitalization for treatment and stabilization.  Treatment Plan Summary: -Patient warrants inpatient psychiatric hospitalization given high risk of harm to self. -Continue Engineer, materials.  -Patient declines medications for depression at this time.  -Please pursue involuntary commitment if patient refuses voluntary psychiatric hospitalization or attempts to leave the hospital.  -Will sign off on patient at this time. Please consult psychiatry again as needed.    Disposition: Recommend psychiatric Inpatient admission when medically cleared.  Faythe Dingwall, DO 11/08/2017 12:10 PM

## 2017-11-08 NOTE — Progress Notes (Signed)
Patient refuses heparin sq at 1400.

## 2017-11-08 NOTE — Progress Notes (Signed)
   11/08/17 1200  Clinical Encounter Type  Visited With Patient and family together  Visit Type Initial;Psychological support;Spiritual support;Critical Care  Referral From Nurse  Consult/Referral To Chaplain  Spiritual Encounters  Spiritual Needs Other (Comment) (Spiritual Care Conversation/Support)  Stress Factors  Patient Stress Factors None identified  Family Stress Factors None identified   I visited with the patient per Spiritual Care consult. No needs were specified.  Please, contact Spiritual Care for further assistance.   Chaplain Shanon Ace M.Div., [facility]

## 2017-11-09 ENCOUNTER — Inpatient Hospital Stay (HOSPITAL_COMMUNITY)
Admission: AD | Admit: 2017-11-09 | Discharge: 2017-11-12 | DRG: 885 | Disposition: A | Discharge status: Home / Self Care | Payer: 59 | Source: Intra-hospital | Attending: Psychiatry | Admitting: Psychiatry

## 2017-11-09 ENCOUNTER — Other Ambulatory Visit: Payer: Self-pay

## 2017-11-09 ENCOUNTER — Encounter (HOSPITAL_COMMUNITY): Payer: Self-pay

## 2017-11-09 DIAGNOSIS — F341 Dysthymic disorder: Secondary | ICD-10-CM

## 2017-11-09 DIAGNOSIS — F332 Major depressive disorder, recurrent severe without psychotic features: Principal | ICD-10-CM | POA: Diagnosis present

## 2017-11-09 DIAGNOSIS — Z720 Tobacco use: Secondary | ICD-10-CM

## 2017-11-09 DIAGNOSIS — F17210 Nicotine dependence, cigarettes, uncomplicated: Secondary | ICD-10-CM | POA: Diagnosis present

## 2017-11-09 DIAGNOSIS — Z915 Personal history of self-harm: Secondary | ICD-10-CM

## 2017-11-09 DIAGNOSIS — Z9911 Dependence on respirator [ventilator] status: Secondary | ICD-10-CM

## 2017-11-09 MED ORDER — TRAZODONE HCL 50 MG PO TABS
50.0000 mg | ORAL_TABLET | Freq: Every evening | ORAL | Status: DC | PRN
  Administered 2017-11-09 – 2017-11-11 (×3): 50 mg via ORAL
  Filled 2017-11-09 (×3): qty 1

## 2017-11-09 MED ORDER — DICLOFENAC SODIUM 1 % TD GEL
2.0000 g | Freq: Four times a day (QID) | TRANSDERMAL | Status: DC
  Administered 2017-11-09 – 2017-11-11 (×6): 2 g via TOPICAL
  Filled 2017-11-09 (×2): qty 100

## 2017-11-09 MED ORDER — THIAMINE HCL 100 MG PO TABS: 100.0000 mg | ORAL_TABLET | Freq: Every day | ORAL | Status: DC

## 2017-11-09 MED ORDER — DICLOFENAC SODIUM 1 % TD GEL: 2.0000 g | Freq: Four times a day (QID) | TRANSDERMAL | Status: DC

## 2017-11-09 MED ORDER — HYDROXYZINE HCL 25 MG PO TABS
25.0000 mg | ORAL_TABLET | Freq: Three times a day (TID) | ORAL | Status: DC | PRN
  Administered 2017-11-12: 25 mg via ORAL
  Filled 2017-11-09: qty 1

## 2017-11-09 MED ORDER — NICOTINE 14 MG/24HR TD PT24
14.0000 mg | MEDICATED_PATCH | Freq: Every day | TRANSDERMAL | Status: DC
  Administered 2017-11-10: 14 mg via TRANSDERMAL
  Filled 2017-11-09 (×3): qty 1

## 2017-11-09 MED ORDER — NICOTINE 14 MG/24HR TD PT24: 14.0000 mg | MEDICATED_PATCH | Freq: Every day | TRANSDERMAL | 0 refills | Status: DC

## 2017-11-09 MED ORDER — DICLOFENAC SODIUM 1 % TD GEL
2.0000 g | Freq: Four times a day (QID) | TRANSDERMAL | Status: DC
  Administered 2017-11-09 (×2): 2 g via TOPICAL
  Filled 2017-11-09: qty 100

## 2017-11-09 MED ORDER — ACETAMINOPHEN 325 MG PO TABS
650.0000 mg | ORAL_TABLET | Freq: Four times a day (QID) | ORAL | Status: DC | PRN
  Administered 2017-11-10 – 2017-11-11 (×3): 650 mg via ORAL
  Filled 2017-11-09 (×3): qty 2

## 2017-11-09 MED ORDER — FOLIC ACID 1 MG PO TABS: 1.0000 mg | ORAL_TABLET | Freq: Every day | ORAL | Status: DC

## 2017-11-09 NOTE — Tx Team (Signed)
Initial Treatment Plan 11/09/2017 7:58 PM [name] YOK:[id number]    PATIENT STRESSORS: Financial difficulties Loss of Husband due to brain cancer 2 years ago   PATIENT STRENGTHS: Capable of independent living Communication skills General fund of knowledge Motivation for treatment/growth Supportive family/friends   PATIENT IDENTIFIED PROBLEMS: Depression  Suicidal ideation  "I would like OP therapy when I leave"  "Be better with stress"               DISCHARGE CRITERIA:  Improved stabilization in mood, thinking, and/or behavior Verbal commitment to aftercare and medication compliance  PRELIMINARY DISCHARGE PLAN: Outpatient therapy Medication management  PATIENT/FAMILY INVOLVEMENT: This treatment plan has been presented to and reviewed with the patient, Crystal Williamson.  The patient and family have been given the opportunity to ask questions and make suggestions.  Windell Moment, RN 11/09/2017, 7:58 PM

## 2017-11-09 NOTE — Progress Notes (Signed)
[name] is a 52 year old female being admitted involuntarily to 400-2 from [facility] floor.  She was medically admitted for intentional OD on xanax and alcohol.  During [facility] admission, she denies suicidal ideation and will contract for safety on the unit.  She denies HI or A/V hallucinations.  She reported that things have been getting worse since the death of her husband.  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  She also reported letting a friend live with her and she is having to deal with him because he lies about everything.  She is very embarrassed about attempting suicide and realizes that she has a lot of friends/coworkers that have been very supportive.   Oriented her to the unit.  Admission paperwork completed and signed.  Belongings searched and secured in locker # 52, no contraband found.  Skin assessment completed and noted bruising to upper left arm, left hip and upper left leg.  Q 15 minute checks initiated for safety.  We will continue to monitor the progress towards her goals.

## 2017-11-09 NOTE — Progress Notes (Signed)
Patient accepted to [facility]. Bed 400-2. Admitted to Dr. Parke Poisson. Number for report is [phone].  Clayborne Dana, RN

## 2017-11-09 NOTE — Clinical Social Work Note (Signed)
Clinical Social Work Assessment  Patient Details  Name: Crystal Williamson MRN: [id number] Date of Birth: October 06, 1965  Date of referral:  11/09/17               Reason for consult:  Facility Placement                Permission sought to share information with:  Facility Art therapist granted to share information::  No  Name::        Agency::  Inpatient psych facilities  Relationship::     Contact Information:     Housing/Transportation Living arrangements for the past 2 months:  Single Family Home Source of Information:  Patient Patient Interpreter Needed:  None Criminal Activity/Legal Involvement Pertinent to Current Situation/Hospitalization:  No - Comment as needed Significant Relationships:  Friend, [employer] Lives with:  Roommate Do you feel safe going back to the place where you live?  Yes Need for family participation in patient care:  No (Coment)  Care giving concerns:  No care giving concerns at the time of assessment.   Social Worker assessment / plan: LCSW following for inpatient psych placement.   Patient admitted for intentional OD.  LCSW met with patient at bedside. No family present. Patient has sitter for safety.   Patient stated that this was her first attempt at suicide and that she has never been impatient in the past.   Patient reports that she is not followed by psych or a therapist in the community. Patient states that she is open to outpatient therapy.   Patient reports that she has no family locally. Patient states that she lives with a roommate that is her support as well has her coworkers.   PLAN: Patient will go inpatient psych at [facility].   Employment status:  [employer] information:  Managed Care PT Recommendations:  Not assessed at this time Information / Referral to community resources:     Patient/Family's Response to care: Unable to assess.   Patient/Family's Understanding of and Emotional Response to  Diagnosis, Current Treatment, and Prognosis:  Patient is understanding of diagnosis and agreeable to treatment plan.   Emotional Assessment Appearance:  Appears younger than stated age Attitude/Demeanor/Rapport:    Affect (typically observed):  Calm, Pleasant Orientation:  Oriented to Self, Oriented to Place, Oriented to  Time, Oriented to Situation Alcohol / Substance use:  Alcohol Use, Illicit Drugs Psych involvement (Current and /or in the community):  No (Comment)  Discharge Needs  Concerns to be addressed:  No discharge needs identified Readmission within the last 30 days:  No Current discharge risk:  None Barriers to Discharge:  No Barriers Identified   Servando Snare, LCSW 11/09/2017, 2:54 PM

## 2017-11-09 NOTE — Progress Notes (Signed)
PROGRESS NOTE    Crystal Williamson  OJJ:[id number] DOB: 1966-04-09 DOA: 11/06/2017 PCP: [name], MD   Outpatient Specialists:     Brief Narrative:  52 yr old female with PMHx Depression suspected ingestion of 0.5mg  Xanax (56 tabs) found unconscious, had to be intubated.  Now extubated and has been transferred to [facility] for psych evaluation.  On 2/19 patient is medically ready for psych placement    Assessment & Plan:   Principal Problem:   Persistent depressive disorder with anxious distress, currently severe Active Problems:   Overdose   intentional overdose of  xanax -s/p extubation -poison control signed off -sitter at bedside  H/o alcohol abuse -CIWA protocol  C/o left shoulder pain -voltaren gel  Severe depression -psych eval-- needs inpatient psych  Hypernatremia -resolved  Tobacco abuse -patch ordered   DVT prophylaxis:  SCD's  Code Status: Full Code   Family Communication:   Disposition Plan:     Consultants:  PCCM psych  Subjective: Left shoulder pain  Objective: Vitals:   11/09/17 0000 11/09/17 0354 11/09/17 0400 11/09/17 0722  BP:   132/74 119/69  Pulse: 79     Resp: 16     Temp:  98.3 F (36.8 C)  97.6 F (36.4 C)  TempSrc:  Oral  Oral  SpO2: 96%     Weight:      Height:        Intake/Output Summary (Last 24 hours) at 11/09/2017 9371 Last data filed at 11/09/2017 0400 Gross per 24 hour  Intake 1830 ml  Output -  Net 1830 ml   Filed Weights   11/06/17 2124 11/07/17 0500 11/08/17 0510  Weight: 97.5 kg (214 lb 15.2 oz) 86.1 kg (189 lb 13.1 oz) 85.4 kg (188 lb 4.4 oz)    Examination:  General exam: up walking in room Respiratory system: clear, no wheezing Cardiovascular system: rrr Gastrointestinal system: +BS, soft Central nervous system: A+Ox3 Extremities: moves all 4 ext      Data Reviewed: I have personally reviewed following labs and imaging studies  CBC: Recent Labs  Lab 11/06/17 1752  11/07/17 0325 11/08/17 0353  WBC 6.9 8.7 8.9  NEUTROABS 4.2  --   --   HGB 14.2 12.0 12.2  HCT 41.7 36.8 35.8*  MCV 96.1 97.6 97.0  PLT 228 209 696   Basic Metabolic Panel: Recent Labs  Lab 11/06/17 1752 11/07/17 0325 11/08/17 0353  NA 147* 146* 141  K 3.6 3.2* 3.5  CL 111 116* 109  CO2 26 21* 27  GLUCOSE 105* 84 113*  BUN 8 9 12   CREATININE 0.49 0.51 0.52  CALCIUM 9.2 8.0* 8.3*  MG  --  1.6* 2.0  PHOS  --  3.1 3.6   GFR: Estimated Creatinine Clearance: 101.7 mL/min (by C-G formula based on SCr of 0.52 mg/dL). Liver Function Tests: Recent Labs  Lab 11/06/17 1752  AST 27  ALT 25  ALKPHOS 57  BILITOT 0.7  PROT 7.0  ALBUMIN 4.3   No results for input(s): LIPASE, AMYLASE in the last 168 hours. No results for input(s): AMMONIA in the last 168 hours. Coagulation Profile: No results for input(s): INR, PROTIME in the last 168 hours. Cardiac Enzymes: Recent Labs  Lab 11/06/17 2142  CKTOTAL 124   BNP (last 3 results) No results for input(s): PROBNP in the last 8760 hours. HbA1C: No results for input(s): HGBA1C in the last 72 hours. CBG: Recent Labs  Lab 11/07/17 1208 11/07/17 1614 11/07/17 1952 11/07/17 2300  11/08/17 0308  GLUCAP 92 93 101* 136* 138*   Lipid Profile: Recent Labs    11/06/17 2142  TRIG 83   Thyroid Function Tests: No results for input(s): TSH, T4TOTAL, FREET4, T3FREE, THYROIDAB in the last 72 hours. Anemia Panel: No results for input(s): VITAMINB12, FOLATE, FERRITIN, TIBC, IRON, RETICCTPCT in the last 72 hours. Urine analysis:    Component Value Date/Time   COLORURINE YELLOW 11/14/2013 [place] 11/14/2013 1239   LABSPEC 1.009 11/14/2013 1239   PHURINE 7.0 11/14/2013 1239   GLUCOSEU NEG 11/14/2013 1239   HGBUR NEG 11/14/2013 1239   HGBUR negative 06/14/2009 0924   BILIRUBINUR negative 07/16/2016 1629   BILIRUBINUR neg 04/27/2013 1333   KETONESUR negative 07/16/2016 1629   KETONESUR NEG 11/14/2013 1239    PROTEINUR negative 07/16/2016 1629   PROTEINUR NEG 11/14/2013 1239   UROBILINOGEN 0.2 07/16/2016 1629   UROBILINOGEN 0.2 11/14/2013 1239   NITRITE Negative 07/16/2016 1629   NITRITE NEG 11/14/2013 1239   LEUKOCYTESUR Negative 07/16/2016 1629    Recent Results (from the past 240 hour(s))  MRSA PCR Screening     Status: None   Collection Time: 11/06/17  9:26 PM  Result Value Ref Range Status   MRSA by PCR NEGATIVE NEGATIVE Final    Comment:        The GeneXpert MRSA Assay (FDA approved for NASAL specimens only), is one component of a comprehensive MRSA colonization surveillance program. It is not intended to diagnose MRSA infection nor to guide or monitor treatment for MRSA infections. Performed at [facility], [place] [address] [place], [place] [address]       Anti-infectives (From admission, onward)   None       Radiology Studies: Dg Chest Port 1 View  Result Date: 11/08/2017 CLINICAL DATA:  Respiratory failure.  Status postextubation EXAM: PORTABLE CHEST 1 VIEW COMPARISON:  November 07, 2017 FINDINGS: Endotracheal tube and nasogastric tube have been removed. No pneumothorax. There is no appreciable edema or consolidation. Heart is borderline enlarged with pulmonary vascularity within normal limits. No adenopathy. No appreciable bone lesions. IMPRESSION: No pneumothorax. No edema or consolidation. Stable cardiac prominence. Electronically Signed   By: Lowella Grip III M.D.   On: 11/08/2017 07:06        Scheduled Meds: . diclofenac sodium  2 g Topical QID  . folic acid  1 mg Oral Daily  . heparin  5,000 Units Subcutaneous Q8H  . multivitamin with minerals  1 tablet Oral Daily  . nicotine  14 mg Transdermal Daily  . pantoprazole (PROTONIX) IV  40 mg Intravenous QHS  . thiamine  100 mg Oral Daily   Or  . thiamine  100 mg Intravenous Daily   Continuous Infusions: . sodium chloride    . lactated ringers 75 mL/hr at 11/08/17 1200     LOS:  3 days    Time spent: 25 min    Geradine Girt, DO [facility] Pager [phone]  If 7PM-7AM, please contact night-coverage [email or web address] Password [facility] 11/09/2017, 8:23 AM

## 2017-11-09 NOTE — Progress Notes (Signed)
LCSW following for inpatient psych placement.  Patient has bed at [facility] room 400 bed 2.   Patient can transport at 4pm.  Patient will transport by [employer].   LCSW notified patient of transfer. Patient did not indicate any family or friends for LCSW to notify.  RN report #: [place], Shawna Clamp [place] [phone]

## 2017-11-09 NOTE — Discharge Summary (Signed)
Physician Discharge Summary  Crystal Williamson VOH:[id number] DOB: 1966/05/29 DOA: 11/06/2017  PCP: Crystal Knapp, MD  Admit date: 11/06/2017 Discharge date: 11/09/2017   Recommendations for Outpatient Follow-Up:   1. To [facility]   Discharge Diagnosis:   Principal Problem:   Persistent depressive disorder with anxious distress, currently severe Active Problems:   Overdose   Discharge disposition:  [facility]  Discharge Condition: Improved.  Diet recommendation: regular  Wound care: None.   History of Present Illness:   52 yr old female with PMHx Depression, presents to [name] post intentional OD with Xanax.  Per ED Physician note Xanax was prescribed to her friend. Pt reportedly took the remaining 56 tabs in the bottle along with alcohol. On presentation to the ED she was unconscious but protecting her airway. Pt received Narcan but remained GCS 6 and decision was made to intubate. There is no evidence in the history to suggest opiate use. UDS collected is positive for Benzodiazepines and ETOH level >200. Acteaminophen, Salicylates were negative.      Hospital Course by Problem:   intentional overdose of  xanax -s/p extubation -poison control signed off  H/o alcohol abuse -no signs of withdrawal  C/o left shoulder pain -voltaren gel  Severe depression -needs inpatient psych  Hypernatremia -resolved  Tobacco abuse -patch ordered      Medical Consultants:    pccm  psych   Discharge Exam:   Vitals:   11/09/17 0800 11/09/17 1150  BP:  129/70  Pulse: 72 71  Resp: 13 18  Temp:  99 F (37.2 C)  SpO2: 96%    Vitals:   11/09/17 0400 11/09/17 0722 11/09/17 0800 11/09/17 1150  BP: 132/74 119/69  129/70  Pulse:   72 71  Resp:   13 18  Temp:  97.6 F (36.4 C)  99 F (37.2 C)  TempSrc:  Oral  Oral  SpO2:   96%   Weight:      Height:           The results of significant diagnostics from this hospitalization (including imaging,  microbiology, ancillary and laboratory) are listed below for reference.     Procedures and Diagnostic Studies:   Dg Abdomen 1 View  Result Date: 11/06/2017 CLINICAL DATA:  ET tube and OG tube placement. EXAM: ABDOMEN - 1 VIEW COMPARISON:  None FINDINGS: There is a enteric tube with tip projecting over the body of the stomach. Side port well below GE junction. No dilated bowel loops noted. IMPRESSION: 1. Enteric tube tip projects over the body of the stomach. Electronically Signed   By: Kerby Moors M.D.   On: 11/06/2017 18:17   Ct Head Wo Contrast  Result Date: 11/06/2017 CLINICAL DATA:  Found down. Possible overdose. Possible head trauma. Initial encounter. EXAM: CT HEAD WITHOUT CONTRAST TECHNIQUE: Contiguous axial images were obtained from the base of the skull through the vertex without intravenous contrast. COMPARISON:  None. FINDINGS: Brain: There is no evidence of acute infarct, intracranial hemorrhage, mass, midline shift, or extra-axial fluid collection. The ventricles and sulci are normal. Vascular: No hyperdense vessel. Skull: No fracture.  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 Sinuses/Orbits: Trace left sphenoid sinus fluid. Trace bilateral maxillary sinus mucosal thickening. Trace left mastoid effusion. Unremarkable orbits. Other: Mild right scalp swelling. IMPRESSION: 1. No evidence of acute intracranial abnormality. Unremarkable CT appearance of the brain. 2. Right scalp swelling. Electronically Signed   By: Logan Bores M.D.   On: 11/06/2017 21:11   Ct Soft  Tissue Neck Wo Contrast  Result Date: 11/06/2017 CLINICAL DATA:  Found down. Possible overdose. Possible soft tissue hematoma in the neck. Initial encounter. EXAM: CT NECK WITHOUT CONTRAST TECHNIQUE: Multidetector CT imaging of the neck was performed following the standard protocol without intravenous contrast. COMPARISON:  None. FINDINGS: Pharynx and larynx: Incompletely visualized enteric tube. Endotracheal tube terminates above the  carina. Retained secretions in the posterior nasal cavity and pharynx. No parapharyngeal or retropharyngeal fluid collection or significant inflammatory change. Salivary glands: No inflammation, mass, or stone. Thyroid: Unremarkable. Lymph nodes: No enlarged or suspicious lymph nodes in the neck. Vascular: Unremarkable noncontrast appearance. Limited intracranial: Unremarkable. Visualized orbits: Unremarkable. Mastoids and visualized paranasal sinuses: Minimal mucosal thickening in the maxillary sinuses. Trace left sphenoid sinus fluid. Rightward nasal septal deviation. Trace left mastoid effusion. Skeleton: Incomplete posterior C1 fusion, a normal variant. No acute fracture. C5-6 disc degeneration with moderate disc space narrowing and endplate spurring. Moderate to advanced left facet arthrosis at C3-4 and C4-5. Upper chest: Clear lung apices. Other: No sizable soft tissue hematoma identified in the neck. IMPRESSION: No hematoma or other acute abnormality identified in the neck. Electronically Signed   By: Logan Bores M.D.   On: 11/06/2017 21:08   Dg Chest Port 1 View  Result Date: 11/07/2017 CLINICAL DATA:  Ventilator dependent. EXAM: PORTABLE CHEST 1 VIEW COMPARISON:  Radiograph yesterday. FINDINGS: Tip of the endotracheal tube at the thoracic inlet. Enteric tube in place below the diaphragm. Normal heart size and mediastinal contours. Developing left infrahilar atelectasis. No confluent consolidation. No pulmonary edema, large pneumothorax or pleural effusion. IMPRESSION: Developing left lung base atelectasis. Electronically Signed   By: Jeb Levering M.D.   On: 11/07/2017 05:28   Dg Chest Portable 1 View  Result Date: 11/06/2017 CLINICAL DATA:  Endotracheal tube placement. Unresponsive status post overdose. EXAM: PORTABLE CHEST 1 VIEW COMPARISON:  No comparison studies available. FINDINGS: 1751 hours. Leftward patient rotation. Left costophrenic angle not included on the film. No evidence for focal  airspace consolidation or pulmonary edema. No right pleural effusion and no substantial left pleural effusion evident. Endotracheal tube tip 3.8 cm above the base of the carina. The NG tube passes into the stomach although the distal tip position is not included on the film. The visualized bony structures of the thorax are intact. IMPRESSION: Limited study with out acute cardiopulmonary findings. Endotracheal and NG tubes as above. Electronically Signed   By: Misty Stanley M.D.   On: 11/06/2017 18:18     Labs:   Basic Metabolic Panel: Recent Labs  Lab 11/06/17 1752 11/07/17 0325 11/08/17 0353  NA 147* 146* 141  K 3.6 3.2* 3.5  CL 111 116* 109  CO2 26 21* 27  GLUCOSE 105* 84 113*  BUN 8 9 12   CREATININE 0.49 0.51 0.52  CALCIUM 9.2 8.0* 8.3*  MG  --  1.6* 2.0  PHOS  --  3.1 3.6   GFR Estimated Creatinine Clearance: 101.7 mL/min (by C-G formula based on SCr of 0.52 mg/dL). Liver Function Tests: Recent Labs  Lab 11/06/17 1752  AST 27  ALT 25  ALKPHOS 57  BILITOT 0.7  PROT 7.0  ALBUMIN 4.3   No results for input(s): LIPASE, AMYLASE in the last 168 hours. No results for input(s): AMMONIA in the last 168 hours. Coagulation profile No results for input(s): INR, PROTIME in the last 168 hours.  CBC: Recent Labs  Lab 11/06/17 1752 11/07/17 0325 11/08/17 0353  WBC 6.9 8.7 8.9  NEUTROABS 4.2  --   --  HGB 14.2 12.0 12.2  HCT 41.7 36.8 35.8*  MCV 96.1 97.6 97.0  PLT 228 209 214   Cardiac Enzymes: Recent Labs  Lab 11/06/17 2142  CKTOTAL 124   BNP: Invalid input(s): POCBNP CBG: Recent Labs  Lab 11/07/17 1208 11/07/17 1614 11/07/17 1952 11/07/17 2300 11/08/17 0308  GLUCAP 92 93 101* 136* 138*   D-Dimer No results for input(s): DDIMER in the last 72 hours. Hgb A1c No results for input(s): HGBA1C in the last 72 hours. Lipid Profile Recent Labs    11/06/17 2142  TRIG 83   Thyroid function studies No results for input(s): TSH, T4TOTAL, T3FREE, THYROIDAB  in the last 72 hours.  Invalid input(s): FREET3 Anemia work up No results for input(s): VITAMINB12, FOLATE, FERRITIN, TIBC, IRON, RETICCTPCT in the last 72 hours. Microbiology Recent Results (from the past 240 hour(s))  MRSA PCR Screening     Status: None   Collection Time: 11/06/17  9:26 PM  Result Value Ref Range Status   MRSA by PCR NEGATIVE NEGATIVE Final    Comment:        The GeneXpert MRSA Assay (FDA approved for NASAL specimens only), is one component of a comprehensive MRSA colonization surveillance program. It is not intended to diagnose MRSA infection nor to guide or monitor treatment for MRSA infections. Performed at [facility], [place] [address], [place], [place] [address]      Discharge Instructions:   Discharge Instructions    Diet - low sodium heart healthy   Complete by:  As directed    Increase activity slowly   Complete by:  As directed      Allergies as of 11/09/2017      Reactions   Codeine Itching   REACTION: N \\T \ V   Hydromorphone Itching   Opium    Percocet [oxycodone-acetaminophen] Itching      Medication List    STOP taking these medications   cyclobenzaprine 5 MG tablet Commonly known as:  FLEXERIL   doxycycline 40 MG capsule Commonly known as:  ORACEA   estradiol 1 MG tablet Commonly known as:  ESTRACE     TAKE these medications   aspirin 325 MG tablet Take 650 mg by mouth every 6 (six) hours as needed for mild pain.   diclofenac sodium 1 % Gel Commonly known as:  VOLTAREN Apply 2 g topically 4 (four) times daily.   folic acid 1 MG tablet Commonly known as:  FOLVITE Take 1 tablet (1 mg total) by mouth daily. Start taking on:  11/10/2017   Milk Thistle 200 MG Caps Take 200-400 mg by mouth daily.   multivitamin with minerals Tabs tablet Take 1 tablet by mouth daily.   nicotine 14 mg/24hr patch Commonly known as:  NICODERM CQ - dosed in mg/24 hours Place 1 patch (14 mg total) onto the skin  daily. Start taking on:  11/10/2017   PROBIOTIC DAILY PO Take 1 capsule by mouth.   thiamine 100 MG tablet Take 1 tablet (100 mg total) by mouth daily. Start taking on:  11/10/2017      Follow-up Information    Crystal Knapp, MD Follow up in 1 week(s).   Specialty:  Family Medicine Contact information: [address] [place] [address] [phone]            Time coordinating discharge: 35 min  Signed:  Geradine Girt   [facility] 11/09/2017, 3:28 PM

## 2017-11-09 NOTE — Progress Notes (Signed)
Report called to nurse at [facility] and [employer] transport called for pick up. Patient is stable at transfer to [facility].

## 2017-11-09 NOTE — Progress Notes (Signed)
Adult Psychoeducational Group Note  Date:  11/09/2017 Time:  9:40 PM  Group Topic/Focus:  Wrap-Up Group:   The focus of this group is to help patients review their daily goal of treatment and discuss progress on daily workbooks.  Participation Level:  Active  Participation Quality:  Appropriate  Affect:  Appropriate  Cognitive:  Alert  Insight: Appropriate  Engagement in Group:  Engaged  Modes of Intervention:  Discussion  Additional Comments:  Patient stated having an alright day. Patient's goal is to go home.   Azaya Goedde L Kosha Jaquith 11/09/2017, 9:40 PM

## 2017-11-10 DIAGNOSIS — Z91410 Personal history of adult physical and sexual abuse: Secondary | ICD-10-CM

## 2017-11-10 DIAGNOSIS — T1491XA Suicide attempt, initial encounter: Secondary | ICD-10-CM

## 2017-11-10 DIAGNOSIS — F17210 Nicotine dependence, cigarettes, uncomplicated: Secondary | ICD-10-CM

## 2017-11-10 DIAGNOSIS — Z6379 Other stressful life events affecting family and household: Secondary | ICD-10-CM

## 2017-11-10 DIAGNOSIS — T424X2A Poisoning by benzodiazepines, intentional self-harm, initial encounter: Secondary | ICD-10-CM

## 2017-11-10 DIAGNOSIS — F332 Major depressive disorder, recurrent severe without psychotic features: Principal | ICD-10-CM

## 2017-11-10 MED ORDER — NICOTINE POLACRILEX 2 MG MT GUM
2.0000 mg | CHEWING_GUM | OROMUCOSAL | Status: DC | PRN
  Administered 2017-11-10 – 2017-11-12 (×3): 2 mg via ORAL

## 2017-11-10 NOTE — [facility] Group Notes (Signed)
[place] Group Notes:  (Nursing/MHT/Case Management/Adjunct)  Date:  11/10/2017  Time:  1:30 p.m.  Type of Therapy:  Psychoeducational Skills  Participation Level:  Active  Participation Quality:  Appropriate  Affect:  Appropriate  Cognitive:  Appropriate  Insight:  Appropriate  Engagement in Group:  Engaged  Modes of Intervention:  Education  Summary of Progress/Problems:  Patient was attentive and involved with therapeutic relaxation group.   Cammy Copa 11/10/2017, 2:42 PM

## 2017-11-10 NOTE — Progress Notes (Signed)
D: Pt was in the hallway upon initial approach.  Pt presents with appropriate affect and depressed mood.  Her goal is "sleep would be nice."  Pt denies SI/HI, denies hallucinations, reports L shoulder pain of 3/10.  Pt has been visible in milieu interacting with peers and staff appropriately.  Pt attended evening group.    A: Introduced self to pt.  Actively listened to pt and offered support and encouragement. Medication administered per order.  PRN medication administered for sleep.  Q15 minute safety checks maintained.  R: Pt is safe on the unit.  Pt is compliant with medications.  Pt verbally contracts for safety.  Will continue to monitor and assess.
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
## 2017-11-10 NOTE — Progress Notes (Signed)
Adult Psychoeducational Group Note  Date:  11/10/2017 Time:  1600  Group Topic/Focus:  Healthy Communication:   The focus of this group is to discuss communication, barriers to communication, as well as healthy ways to communicate with others. Identifying Needs:   The focus of this group is to help patients identify their personal needs that have been historically problematic and identify healthy behaviors to address their needs.  Participation Level:  Minimal  Participation Quality:  Appropriate and Attentive  Affect:  Appropriate  Cognitive:  Appropriate  Insight: Appropriate  Engagement in Group:  Engaged  Modes of Intervention:  Discussion, Role-play and Support  Additional Comments:    Toney Sang 11/10/2017, 5:08 PM

## 2017-11-10 NOTE — Progress Notes (Signed)
Recreation Therapy Notes  Date: 11/10/17 Time: 0930 Location: 300 Hall Dayroom  Group Topic: Stress Management  Goal Area(s) Addresses:  Patient will verbalize importance of using healthy stress management.  Patient will identify positive emotions associated with healthy stress management.   Intervention: Stress Management  Activity :  [facility]  LRT introduced the stress management technique of guided imagery.  LRT read a script to allow patients to visualize the sights and sounds of being in the forest.  Patients were to follow along as the script was read to participate in activity.  Education: Stress Management, Discharge Planning.   Education Outcome: Acknowledges edcuation/In group clarification offered/Needs additional education  Clinical Observations/Feedback: Pt did not attend group.    Victorino Sparrow, LRT/CTRS         Ria Comment, Daley Gosse A 11/10/2017 1:05 PM
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
## 2017-11-10 NOTE — Progress Notes (Signed)
D: Pt was in the dayroom upon initial approach.  Pt presents with depressed affect and mood.  Her goal today was to "listen and learn" and she reports she met goal.  Pt denies SI/HI, denies hallucinations, reports chronic L shoulder pain of 2/10.  Pt has been visible in milieu interacting with peers and staff appropriately.  Pt attended evening group.    A: Introduced self to pt.  Actively listened to pt and offered support and encouragement. Medication offered per order.  PRN medication administered for anxiety and pain.  Cold packs provided for pain.  Q15 minute safety checks maintained.  R: Pt is safe on the unit.  Pt is compliant with medications except for Voltaren gel tonight.  Pt verbally contracts for safety.  Will continue to monitor and assess.

## 2017-11-10 NOTE — Progress Notes (Signed)
Adult Psychoeducational Group Note  Date:  11/10/2017 Time:  0830 Group Topic/Focus:  Goals Group:   The focus of this group is to help patients establish daily goals to achieve during treatment and discuss how the patient can incorporate goal setting into their daily lives to aide in recovery.  Participation Level:  Active  Participation Quality:  Appropriate  Affect:  Appropriate  Cognitive:  Appropriate  Insight: Appropriate  Engagement in Group:  Engaged  Modes of Intervention:  Discussion, Orientation and Rapport Building  Additional Comments:  Pt attended orientation/goal group  Crystal Williamson 11/10/2017, 10:54 AM

## 2017-11-10 NOTE — Progress Notes (Signed)
D:  Patient's self inventory sheet, patient sleeps good, sleep medication helpful.  Good appetite, normal energy level, good concentration.  Rated depression 2, hopeless 1, anxiety 3.  Denied withdrawals.  Denied SI.  Physical problems, pain, left shoulder.  Worst pain #2 in past 24 hours, L shoulder.  No pain medicine.  Goal is learn from others and move forward.  Plans to listen and share, interact.  "It is important that I return to work asap.  My income barely pays my bills and I have no PTO.  Outpatient counseling would be most helpful."  No discharge plans. A:  Medications administered per MD orders.  Emotional support and encouragement given patient. R:  Denied SI and HI, contracts for safety.  Denied A/V hallucinations.  Safety maintained with 15 minute checks.

## 2017-11-10 NOTE — Progress Notes (Signed)
Adult Psychoeducational Group Note  Date:  11/10/2017 Time:  9:31 PM  Group Topic/Focus:  Wrap-Up Group:   The focus of this group is to help patients review their daily goal of treatment and discuss progress on daily workbooks.  Participation Level:  Active  Participation Quality:  Appropriate  Affect:  Appropriate  Cognitive:  Alert  Insight: Appropriate  Engagement in Group:  Engaged  Modes of Intervention:  Discussion  Additional Comments:  Patient stated having a good day. Patient's goal for today was to listen.  Crystal Williamson Crystal Williamson 11/10/2017, 9:31 PM

## 2017-11-10 NOTE — Plan of Care (Signed)
Nurse discussed depression, anxiety, coping skills with patient.  

## 2017-11-10 NOTE — BHH Group Notes (Signed)
[facility] Group Therapy                                                             11/10/2017  2:49 PM  Type of Therapy: Mental Health [facility] Presentation  Participation Level: Active   Participation Quality: Attentive  Affect: Appropriate  Cognitive: Oriented  Insight: Developing/Improving  Engagement in Therapy: Engaged  Modes of Intervention: Discussion, Education and Socialization  Summary of Progress/Problems: Pateint was attentive and involved with the group  Verdis Frederickson, SW Intern

## 2017-11-11 DIAGNOSIS — G4700 Insomnia, unspecified: Secondary | ICD-10-CM

## 2017-11-11 DIAGNOSIS — F419 Anxiety disorder, unspecified: Secondary | ICD-10-CM

## 2017-11-11 DIAGNOSIS — R450 Nervousness: Secondary | ICD-10-CM

## 2017-11-11 MED ORDER — DULOXETINE HCL 30 MG PO CPEP
30.0000 mg | ORAL_CAPSULE | Freq: Every day | ORAL | Status: DC
  Administered 2017-11-11 – 2017-11-12 (×2): 30 mg via ORAL
  Filled 2017-11-11 (×4): qty 1

## 2017-11-11 NOTE — Tx Team (Signed)
Interdisciplinary Treatment and Diagnostic Plan Update  11/11/2017 Time of Session: 1751 Crystal Williamson MRN: [id number]  Principal Diagnosis: MDD (major depressive disorder), recurrent severe, without psychosis ([place])  Secondary Diagnoses: Principal Problem:   MDD (major depressive disorder), recurrent severe, without psychosis ([place])   Current Medications:  Current Facility-Administered Medications  Medication Dose Route Frequency Provider Last Rate Last Dose  . acetaminophen (TYLENOL) tablet 650 mg  650 mg Oral Q6H PRN Okonkwo, Justina A, NP   650 mg at 11/11/17 0808  . diclofenac sodium (VOLTAREN) 1 % transdermal gel 2 g  2 g Topical QID Okonkwo, Justina A, NP   2 g at 11/11/17 0809  . DULoxetine (CYMBALTA) DR capsule 30 mg  30 mg Oral Daily Money, Lowry Ram, FNP      . hydrOXYzine (ATARAX/VISTARIL) tablet 25 mg  25 mg Oral TID PRN Lu Duffel, Justina A, NP      . nicotine polacrilex (NICORETTE) gum 2 mg  2 mg Oral PRN Cobos, Myer Peer, MD   2 mg at 11/10/17 2057  . traZODone (DESYREL) tablet 50 mg  50 mg Oral QHS PRN Okonkwo, Justina A, NP   50 mg at 11/10/17 2257   PTA Medications: Medications Prior to Admission  Medication Sig Dispense Refill Last Dose  . aspirin 325 MG tablet Take 650 mg by mouth every 6 (six) hours as needed for mild pain.   11/06/2017 at Unknown time  . diclofenac sodium (VOLTAREN) 1 % GEL Apply 2 g topically 4 (four) times daily.     . folic acid (FOLVITE) 1 MG tablet Take 1 tablet (1 mg total) by mouth daily.     . Milk Thistle 200 MG CAPS Take 200-400 mg by mouth daily.   11/06/2017 at Unknown time  . Multiple Vitamin (MULTIVITAMIN WITH MINERALS) TABS tablet Take 1 tablet by mouth daily.   11/06/2017 at Unknown time  . nicotine (NICODERM CQ - DOSED IN MG/24 HOURS) 14 mg/24hr patch Place 1 patch (14 mg total) onto the skin daily. 28 patch 0   . Probiotic Product (PROBIOTIC DAILY PO) Take 1 capsule by mouth.   11/06/2017 at Unknown time  . thiamine 100 MG tablet  Take 1 tablet (100 mg total) by mouth daily.       Patient Stressors: Financial difficulties Loss of Husband due to brain cancer 2 years ago  Patient Strengths: Capable of independent living Curator fund of knowledge Motivation for treatment/growth Supportive family/friends  Treatment Modalities: Medication Management, Group therapy, Case management,  1 to 1 session with clinician, Psychoeducation, Recreational therapy.   Physician Treatment Plan for Primary Diagnosis: MDD (major depressive disorder), recurrent severe, without psychosis ([place]) Long Term Goal(s): Improvement in symptoms so as ready for discharge Improvement in symptoms so as ready for discharge   Short Term Goals: Ability to identify changes in lifestyle to reduce recurrence of condition will improve Ability to verbalize feelings will improve Ability to disclose and discuss suicidal ideas Ability to demonstrate self-control will improve Ability to identify and develop effective coping behaviors will improve Ability to maintain clinical measurements within normal limits will improve Compliance with prescribed medications will improve Ability to identify triggers associated with substance abuse/mental health issues will improve  Medication Management: Evaluate patient's response, side effects, and tolerance of medication regimen.  Therapeutic Interventions: 1 to 1 sessions, Unit Group sessions and Medication administration.  Evaluation of Outcomes: Progressing  Physician Treatment Plan for Secondary Diagnosis: Principal Problem:   MDD (major depressive disorder), recurrent  severe, without psychosis ([place])  Long Term Goal(s): Improvement in symptoms so as ready for discharge Improvement in symptoms so as ready for discharge   Short Term Goals: Ability to identify changes in lifestyle to reduce recurrence of condition will improve Ability to verbalize feelings will improve Ability to disclose  and discuss suicidal ideas Ability to demonstrate self-control will improve Ability to identify and develop effective coping behaviors will improve Ability to maintain clinical measurements within normal limits will improve Compliance with prescribed medications will improve Ability to identify triggers associated with substance abuse/mental health issues will improve     Medication Management: Evaluate patient's response, side effects, and tolerance of medication regimen.  Therapeutic Interventions: 1 to 1 sessions, Unit Group sessions and Medication administration.  Evaluation of Outcomes: Progressing   RN Treatment Plan for Primary Diagnosis: MDD (major depressive disorder), recurrent severe, without psychosis ([place]) Long Term Goal(s): Knowledge of disease and therapeutic regimen to maintain health will improve  Short Term Goals: Ability to identify and develop effective coping behaviors will improve and Compliance with prescribed medications will improve  Medication Management: RN will administer medications as ordered by provider, will assess and evaluate patient's response and provide education to patient for prescribed medication. RN will report any adverse and/or side effects to prescribing provider.  Therapeutic Interventions: 1 on 1 counseling sessions, Psychoeducation, Medication administration, Evaluate responses to treatment, Monitor vital signs and CBGs as ordered, Perform/monitor CIWA, COWS, AIMS and Fall Risk screenings as ordered, Perform wound care treatments as ordered.  Evaluation of Outcomes: Progressing   LCSW Treatment Plan for Primary Diagnosis: MDD (major depressive disorder), recurrent severe, without psychosis ([place]) Long Term Goal(s): Safe transition to appropriate next level of care at discharge, Engage patient in therapeutic group addressing interpersonal concerns.  Short Term Goals: Engage patient in aftercare planning with referrals and resources, Increase  social support and Increase skills for wellness and recovery  Therapeutic Interventions: Assess for all discharge needs, 1 to 1 time with Social worker, Explore available resources and support systems, Assess for adequacy in community support network, Educate family and significant other(s) on suicide prevention, Complete Psychosocial Assessment, Interpersonal group therapy.  Evaluation of Outcomes: Progressing   Progress in Treatment: Attending groups: Yes. Participating in groups: Yes. Taking medication as prescribed: Yes. Toleration medication: Yes. Family/Significant other contact made: No, will contact:  employer Patient understands diagnosis: Yes. Discussing patient identified problems/goals with staff: Yes. Medical problems stabilized or resolved: Yes. Denies suicidal/homicidal ideation: Yes. Issues/concerns per patient self-inventory: No. Other: none  New problem(s) identified: No, Describe:  none  New Short Term/Long Term Goal(s):Pt goal: "learn whatever I can."  Discharge Plan or Barriers:   Reason for Continuation of Hospitalization: Depression Medication stabilization  Estimated Length of Stay: 2-4 days.  Attendees: Patient:Crystal Williamson 11/10/2017  Physician: Dr Parke Poisson, MD 11/10/2017  Nursing: Grayland Ormond, RN 11/10/2017  RN Care Manager:   Social Worker: Lurline Idol, LCSW 11/10/2017  Recreational Therapist:    Other:    Other:    Other:     Scribe for Treatment Team: Joanne Chars, LCSW 11/11/2017 10:41 AM

## 2017-11-11 NOTE — Progress Notes (Addendum)
[facility] MD Progress Note  11/11/2017 2:05 PM Crystal Williamson  MRN:  [id number]   Subjective:  Patient reports that she is doing fine today and that she still doesn't feel she need any medications. She feels that medications are just a "crutch" instead of focusing on what's wrong. She denies any SI/HI/AVH and contracts for safety. She is focused on discharge and she keeps repeating that she does not need to be here because she is losing money.    Objective: Patient's chart and findings reviewed and discussed with treatment team. Patient presents in her room and she is pleasant and cooperative. She appears anxious. Patient is educated on medication sand she finally agrees to start Cymbalta 30 mg Daily to assist with depression, anxiety, and to help with her neck pain. She is reluctant and by the time I completed this note she has reported palpitations hours after the medication was given to the RN. An EKG has been ordered.    Principal Problem: MDD (major depressive disorder), recurrent severe, without psychosis ([place]) Diagnosis:   Patient Active Problem List   Diagnosis Date Noted  . MDD (major depressive disorder), recurrent severe, without psychosis ([place]) [F33.2] 11/09/2017  . Persistent depressive disorder with anxious distress, currently severe [F34.1]   . Overdose [T50.901A] 11/06/2017  . Alopecia [L65.9] 07/19/2012  . 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 HEADACHE [R51] 08/06/2009  . KERATOSIS PILARIS [Q82.8] 06/14/2009  . HYPERGLYCEMIA [R73.09] 02/22/2009  . DERMATOPHYTOSIS OF NAIL [B35.1] 02/14/2009  . OBESITY [E66.9] 02/14/2009  . SEBORRHEIC KERATOSIS [L82.1] 02/14/2009  . LOW BACK PAIN, CHRONIC [M54.5] 02/14/2009  . FATIGUE [R53.81, R53.83] 02/14/2009  . ATTENTION DEFICIT DISORDER, HX OF [Z86.59] 02/14/2009   Total Time spent with patient: 25 minutes  Past Psychiatric History: See H&P  Past Medical History:  Past Medical History:  Diagnosis Date  . Allergy   .  Depression   . Fibroid   . Headache(784.0)   . No pertinent past medical history   . Varicose veins    bilateral    Past Surgical History:  Procedure Laterality Date  . ABDOMINAL HYSTERECTOMY  06/01/2012   Procedure: HYSTERECTOMY ABDOMINAL;  Surgeon: Terrance Mass, MD;  Location: [facility];  Service: Gynecology;  Laterality: N/A;  . SEPTOPLASTY    . VARICOSE VEIN SURGERY    . WISDOM TOOTH EXTRACTION     Family History:  Family History  Problem Relation Age of Onset  . Colon cancer Paternal Grandfather   . Diabetes Paternal Grandfather        type 2   . Cancer Paternal Grandfather        COLON  . Breast cancer Paternal Grandmother   . Stroke Maternal Grandmother   . Lupus Mother   . Lumbar disc disease Mother   . Varicose Veins Mother   . Cancer Maternal Grandfather   . Diabetes Maternal Grandfather    Family Psychiatric  History: See H&P Social History:  Social History   Substance and Sexual Activity  Alcohol Use Yes  . Alcohol/week: 0.0 oz   Comment: 3-4x week      Social History   Substance and Sexual Activity  Drug Use No    Social History   Socioeconomic History  . Marital status: Widowed    Spouse name: None  . Number of children: None  . Years of education: None  . Highest education level: None  Social Needs  . Financial resource strain: None  . Food insecurity - worry:  None  . Food insecurity - inability: None  . Transportation needs - medical: None  . Transportation needs - non-medical: None  Occupational History  . None  Tobacco Use  . Smoking status: Current Every Day Smoker    Packs/day: 0.25    Types: Cigarettes  . Smokeless tobacco: Never Used  . Tobacco comment: 5 cig a day  Substance and Sexual Activity  . Alcohol use: Yes    Alcohol/week: 0.0 oz    Comment: 3-4x week   . Drug use: No  . Sexual activity: Not Currently    Birth control/protection: None, Surgical  Other Topics Concern  . None  Social History Narrative   **  Merged History Encounter **       Additional Social History:                         Sleep: Good  Appetite:  Good  Current Medications: Current Facility-Administered Medications  Medication Dose Route Frequency Provider Last Rate Last Dose  . acetaminophen (TYLENOL) tablet 650 mg  650 mg Oral Q6H PRN Okonkwo, Justina A, NP   650 mg at 11/11/17 0808  . diclofenac sodium (VOLTAREN) 1 % transdermal gel 2 g  2 g Topical QID Okonkwo, Justina A, NP   2 g at 11/11/17 1117  . DULoxetine (CYMBALTA) DR capsule 30 mg  30 mg Oral Daily Money, Lowry Ram, FNP   30 mg at 11/11/17 1116  . hydrOXYzine (ATARAX/VISTARIL) tablet 25 mg  25 mg Oral TID PRN Lu Duffel, Justina A, NP      . nicotine polacrilex (NICORETTE) gum 2 mg  2 mg Oral PRN Cobos, Myer Peer, MD   2 mg at 11/10/17 2057  . traZODone (DESYREL) tablet 50 mg  50 mg Oral QHS PRN Okonkwo, Justina A, NP   50 mg at 11/10/17 2257    Lab Results: No results found for this or any previous visit (from the past 48 hour(s)).  Blood Alcohol level:  Lab Results  Component Value Date   ETH 290 (H) 27/74/1287    Metabolic Disorder Labs: Lab Results  Component Value Date   HGBA1C 5.1 07/16/2016   MPG 100 07/16/2016   MPG 120 (H) 06/20/2015   No results found for: PROLACTIN Lab Results  Component Value Date   CHOL 180 07/16/2016   TRIG 83 11/06/2017   HDL 95 07/16/2016   CHOLHDL 1.9 07/16/2016   VLDL 11 07/16/2016   LDLCALC 74 07/16/2016   LDLCALC 83 06/20/2015    Physical Findings: AIMS: Facial and Oral Movements Muscles of Facial Expression: None, normal Lips and Perioral Area: None, normal Jaw: None, normal Tongue: None, normal,Extremity Movements Upper (arms, wrists, hands, fingers): None, normal Lower (legs, knees, ankles, toes): None, normal, Trunk Movements Neck, shoulders, hips: None, normal, Overall Severity Severity of abnormal movements (highest score from questions above): None, normal Incapacitation due to  abnormal movements: None, normal Patient's awareness of abnormal movements (rate only patient's report): No Awareness, Dental Status Current problems with teeth and/or dentures?: No Does patient usually wear dentures?: No  CIWA:  CIWA-Ar Total: 1 COWS:  COWS Total Score: 1  Musculoskeletal: Strength & Muscle Tone: within normal limits Gait & Station: normal Patient leans: N/A  Psychiatric Specialty Exam: Physical Exam  Nursing note and vitals reviewed. Constitutional: She is oriented to person, place, and time. She appears well-developed and well-nourished.  Cardiovascular: Normal rate.  Respiratory: Effort normal.  Musculoskeletal: Normal range of motion.  Neurological: She is alert and oriented to person, place, and time.  Skin: Skin is warm.    Review of Systems  Constitutional: Negative.   HENT: Negative.   Eyes: Negative.   Respiratory: Negative.   Cardiovascular: Negative.   Gastrointestinal: Negative.   Genitourinary: Negative.   Musculoskeletal: Negative.   Skin: Negative.   Neurological: Negative.   Endo/Heme/Allergies: Negative.   Psychiatric/Behavioral: Positive for depression. Negative for hallucinations and suicidal ideas. The patient is nervous/anxious.     Blood pressure 115/72, pulse 75, temperature 98.7 F (37.1 C), temperature source Oral, resp. rate 16, height 5\' 6"  (1.676 m), weight 85.3 kg (188 lb), last menstrual period 02/08/2012.Body mass index is 30.34 kg/m.  General Appearance: Casual  Eye Contact:  Good  Speech:  Clear and Coherent and Normal Rate  Volume:  Normal  Mood:  Anxious  Affect:  Congruent  Thought Process:  Goal Directed and Descriptions of Associations: Intact  Orientation:  Full (Time, Place, and Person)  Thought Content:  WDL  Suicidal Thoughts:  No  Homicidal Thoughts:  No  Memory:  Immediate;   Good Recent;   Good Remote;   Good  Judgement:  Good  Insight:  Good  Psychomotor Activity:  Normal  Concentration:   Concentration: Good and Attention Span: Good  Recall:  Good  Fund of Knowledge:  Good  Language:  Good  Akathisia:  No  Handed:  Right  AIMS (if indicated):     Assets:  Communication Skills Desire for Improvement Financial Resources/Insurance Housing Physical Health Social Support Transportation  ADL's:  Intact  Cognition:  WNL  Sleep:  Number of Hours: 5.75   Problems Addressed: MDD severe  Treatment Plan Summary: Daily contact with patient to assess and evaluate symptoms and progress in treatment, Medication management and Plan is to:  -Start Cymbalta 30 mg PO Daily for mood stability and titrate as needed -Continue Trazodone 50 mg PO QHS PRN for insomnia -Continue Vistaril 25 mg PO TID PRN for anxiety -Encourage group therapy participation  Lewis Shock, FNP 11/11/2017, 2:05 PM    Agree with NP Progress Note

## 2017-11-11 NOTE — Progress Notes (Incomplete)
Adult Psychoeducational Group Note  Date:  11/11/2017 Time:    Group Topic/Focus:  {BHH Group Topics/Focuses:20546}  Participation Level:  {BHH PARTICIPATION KDXIP:38250}  Participation Quality:  {BHH PARTICIPATION QUALITY:22265}  Affect:  {BHH AFFECT:22266}  Cognitive:  {BHH COGNITIVE:22267}  Insight: {BHH Insight2:20797}  Engagement in Group:  {BHH ENGAGEMENT IN NLZJQ:73419}  Modes of Intervention:  {BHH MODES OF INTERVENTION:22269}  Additional Comments:  [name] 11/11/2017, 2:59 PM

## 2017-11-11 NOTE — Progress Notes (Signed)
Adult Psychoeducational Group Note  Date:  11/11/2017 Time:  9:24 PM  Group Topic/Focus:  Wrap-Up Group:   The focus of this group is to help patients review their daily goal of treatment and discuss progress on daily workbooks.  Participation Level:  Active  Participation Quality:  Appropriate and Attentive  Affect:  Appropriate  Cognitive:  Alert, Appropriate and Oriented  Insight: Good  Engagement in Group:  Engaged  Modes of Intervention:  Discussion  Additional Comments:  Pt stated she had a fun day. Pt stated her goal was to "absorb everything and to share", which she achieved. Pt stated her day ranged from a 1 to a 10 and said she wants to talk to a doctor tomorrow about going home.  Milas Kocher 11/11/2017, 9:24 PM

## 2017-11-11 NOTE — Progress Notes (Signed)
Adult Psychoeducational Group Note  Date:  11/11/2017 Time:  0900 Group Topic/Focus:  Goals Group:   The focus of this group is to help patients establish daily goals to achieve during treatment and discuss how the patient can incorporate goal setting into their daily lives to aide in recovery.  Participation Level:  Active  Participation Quality:  Attentive  Affect:  Appropriate  Cognitive:  Appropriate  Insight: Good  Engagement in Group:  Engaged  Modes of Intervention:  Discussion, Orientation and Support  Additional Comments:    Toney Sang 11/11/2017, 4:45 PM

## 2017-11-11 NOTE — Progress Notes (Signed)
D: Patient is pleasant with staff.  She denies any depressive symptoms or thoughts of self harm.  She is focused on discharge and is resistant to taking any mediations.  She took the dose of cymbalta that was ordered for her.  Some time later, patient came to nurse to request an information sheet on side effects.  She was complaining of "heart palpitations" and "dizziness."  An EKG was performed and shown to the NP, Darnelle Maffucci.  Patient's EKG was unremarkable and her pulse was wnl.  She denies any depression or hopelessness.  She rates her anxiety as a 2.  She denies any thoughts of self harm.  A: Continue to monitor medication management and MD orders.  Safety checks continued every 15 minutes per protocol.  Offer support and encouragement as needed.  R: Patient is receptive to staff; her behavior is appropriate.

## 2017-11-12 MED ORDER — TRAZODONE HCL 50 MG PO TABS: 50.0000 mg | ORAL_TABLET | Freq: Every evening | ORAL | 0 refills | Status: DC | PRN

## 2017-11-12 MED ORDER — HYDROXYZINE HCL 25 MG PO TABS: 25.0000 mg | ORAL_TABLET | Freq: Three times a day (TID) | ORAL | 0 refills | Status: DC | PRN

## 2017-11-12 MED ORDER — DULOXETINE HCL 20 MG PO CPEP
40.0000 mg | ORAL_CAPSULE | Freq: Every day | ORAL | Status: DC
  Filled 2017-11-12 (×2): qty 2

## 2017-11-12 MED ORDER — DULOXETINE HCL 40 MG PO CPEP: 40.0000 mg | ORAL_CAPSULE | Freq: Every day | ORAL | 0 refills | Status: DC

## 2017-11-12 NOTE — Progress Notes (Signed)
  [facility] Adult Case Management Discharge Plan :  Will you be returning to the same living situation after discharge:  Yes,  with roomate At discharge, do you have transportation home?: Yes,  patient reports her roomate will pick her up at discharge Do you have the ability to pay for your medications: No.  Release of information consent forms completed and in the chart;  Patient's signature needed at discharge.  Patient to Follow up at: Follow-up Information    [facility] Follow up.   Why:  Please follow up within 3-5 days after your discharge for any outpatient services.  Contact information: [address] [place], [place] [address] Phone: [phone] Fax:           Next level of care provider has access to [place] and Suicide Prevention discussed: Yes,  with the patient  Have you used any form of tobacco in the last 30 days? (Cigarettes, Smokeless Tobacco, Cigars, and/or Pipes): Yes  Has patient been referred to the Quitline?: Patient refused referral  Patient has been referred for addiction treatment: N/A  Marylee Floras, [place] 11/12/2017, 2:07 PM

## 2017-11-12 NOTE — BHH Suicide Risk Assessment (Addendum)
[facility] Discharge Suicide Risk Assessment   Principal Problem: MDD (major depressive disorder), recurrent severe, without psychosis ([place]) Discharge Diagnoses:  Patient Active Problem List   Diagnosis Date Noted  . MDD (major depressive disorder), recurrent severe, without psychosis ([place]) [F33.2] 11/09/2017  . Persistent depressive disorder with anxious distress, currently severe [F34.1]   . Overdose [T50.901A] 11/06/2017  . Alopecia [L65.9] 07/19/2012  . 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 HEADACHE [R51] 08/06/2009  . KERATOSIS PILARIS [Q82.8] 06/14/2009  . HYPERGLYCEMIA [R73.09] 02/22/2009  . DERMATOPHYTOSIS OF NAIL [B35.1] 02/14/2009  . OBESITY [E66.9] 02/14/2009  . SEBORRHEIC KERATOSIS [L82.1] 02/14/2009  . LOW BACK PAIN, CHRONIC [M54.5] 02/14/2009  . FATIGUE [R53.81, R53.83] 02/14/2009  . ATTENTION DEFICIT DISORDER, HX OF [Z86.59] 02/14/2009    Total Time spent with patient: 30 minutes  Musculoskeletal: Strength & Muscle Tone: within normal limits Gait & Station: normal Patient leans: N/A  Psychiatric Specialty Exam: ROS no headache, no chest pain, no shortness of breath, no vomiting, reports chronic shoulder pain/discomfort . No rash, no fever   Blood pressure 117/62, pulse 79, temperature 98.7 F (37.1 C), temperature source Oral, resp. rate 20, height 5\' 6"  (1.676 m), weight 85.3 kg (188 lb), last menstrual period 02/08/2012.Body mass index is 30.34 kg/m.  General Appearance: improved grooming   Eye Contact::  Good  Speech:  Normal Rate409  Volume:  Normal  Mood:  reports " my mood is better", denies feeling depressed at this time  Affect:  Appropriate and more reactive, smiles at times appropriately   Thought Process:  Linear and Descriptions of Associations: Intact  Orientation:  Full (Time, Place, and Person)  Thought Content:  no hallucinations, no delusions, not internally preoccupied   Suicidal Thoughts:  No denies any suicidal or self injurious  ideations, denies any homicidal ideations   Homicidal Thoughts:  No  Memory:  recent and remote grossly intact   Judgement:  Fair improving   Insight:  Fair improving  Psychomotor Activity:  Normal  Concentration:  Good  Recall:  Good  Fund of Knowledge:Good  Language: Good  Akathisia:  Negative  Handed:  Right  AIMS (if indicated):     Assets:  Communication Skills Desire for Improvement Resilience Others:  employed   Sleep:  Number of Hours: 4.75  Cognition: WNL  ADL's:  Intact   Mental Status Per Nursing Assessment::   On Admission:  NA  Demographic Factors:  52 year old female, widowed, employed, lives with roommate.   Loss Factors: Financial issues   Historical Factors: No prior psychiatric admissions, no prior suicidal attempts, denies prior episodes of severe depression   Risk Reduction Factors:   Employed, Living with another person, especially a relative and Positive coping skills or problem solving skills  Continued Clinical Symptoms:  At this time patient is alert, attentive, well related, pleasant , describes mood as improved and currently minimizes depression, affect is appropriate, reactive, smiles at times appropriately. Denies suicidal ideations, denies homicidal ideations , denies hallucinations, not internally preoccupied, no delusions expressed , future oriented, looking forward to returning to work.   Behavior on unit calm and in good control, presents pleasant on approach. Currently on Cymbalta trial, does not endorse medication side effects at this time , although endorses anxiety about being on a psychiatric/antidepressant  medication and about possible side effects. We reviewed her concerns.  She states, however, that she feels that thus far it has been well tolerated and is hopeful it will help , so is  willing to continue . Will increase dose from 30 mgrs to 40 mgrs QDAY prior to discharge .  Cognitive Features That Contribute To Risk:  No gross  cognitive deficits noted upon discharge. Is alert , attentive, and oriented x 3   Suicide Risk:  Mild:  Suicidal ideation of limited frequency, intensity, duration, and specificity.  There are no identifiable plans, no associated intent, mild dysphoria and related symptoms, good self-control (both objective and subjective assessment), few other risk factors, and identifiable protective factors, including available and accessible social support.    Plan Of Care/Follow-up recommendations:  Activity:  as tolerated Diet:  regular Tests:  NA Other:  See below  Patient is expressing readiness for discharge, and there are no current or ongoing  grounds for involuntary commitment  She is leaving unit in good spirits  Plans to return home Plans to follow up as above  Encouraged her to abstain from alcohol.   Jenne Campus, MD 11/12/2017, 12:03 PM

## 2017-11-12 NOTE — BHH Suicide Risk Assessment (Signed)
BHH INPATIENT:  Family/Significant Other Suicide Prevention Education  Suicide Prevention Education:   SPE completed with patient, as patient refused to consent to family contact. SPI pamphlet provided to pt and pt was encouraged to share information with support network, ask questions, and talk about any concerns relating to SPE. Patient denies access to guns/firearms and verbalized understanding of information provided. Mobile Crisis information also provided to patient.   

## 2017-11-12 NOTE — Progress Notes (Signed)
Data. Patient denies SI/HI/AVH. Verbally contracts for safety on the unit and to come to staff before acting of any self harm thoughts/feelings/voices. Patient interacting well with staff and other patients. On her self assessment patient reports 0/10 for  Depression, and 1/10 for anxiety. Her plan for today is "Learning from others experiences and other when I can."  Action. Emotional support and encouragement offered. Education provided on medication, indications and side effect. Q 15 minute checks done for safety. Response. Safety on the unit maintained through 15 minute checks.  Medications taken as prescribed. Attended groups. Remained calm and appropriate through out shift.  Pt. discharged to lobby.  Belongings sheet reviewed and signed by pt. and all belongings, including medication scripts,  sent home. Paperwork reviewed and pt. able to verbalize understanding of education. Pt. in no current distress and ambulatory.

## 2017-11-12 NOTE — Progress Notes (Signed)
D: Pt denies SI/HI/AVH. Pt is pleasant and cooperative. Pt presents very symptomatic about taking medications due to all the various side effects that go along with medications.   A: Pt was offered support and encouragement. Pt was given scheduled medications. Pt was encourage to attend groups. Q 15 minute checks were done for safety.   R:Pt attends groups and interacts well with peers and staff. Pt is taking medication. Pt has no complaints.Pt receptive to treatment and safety maintained on unit.

## 2017-11-12 NOTE — Progress Notes (Signed)
Recreation Therapy Notes  Date: 11/12/17 Time: 0930 Location: 300 Hall Dayroom  Group Topic: Stress Management  Goal Area(s) Addresses:  Patient will verbalize importance of using healthy stress management.  Patient will identify positive emotions associated with healthy stress management.   Behavioral Response: Engaged  Intervention: Stress Management  Activity :  Meditation.  LRT introduced the stress management technique of meditation.  LRT played a meditation from the Calm app the focused on gratitude.  Patients were to listen and follow along with the meditation as it played.  Education:  Stress Management, Discharge Planning.   Education Outcome: Acknowledges edcuation/In group clarification offered/Needs additional education  Clinical Observations/Feedback: Pt attended group.    Victorino Sparrow, LRT/CTRS         Ria Comment, Faustine Tates A 11/12/2017 11:20 AM

## 2017-11-12 NOTE — BHH Group Notes (Signed)
[place] LCSW Group Therapy Note  Date/Time: 11/11/17, 1315  Type of Therapy/Topic:  Group Therapy:  Balance in Life  Participation Level:  active  Description of Group:    This group will address the concept of balance and how it feels and looks when one is unbalanced. Patients will be encouraged to process areas in their lives that are out of balance, and identify reasons for remaining unbalanced. Facilitators will guide patients utilizing problem- solving interventions to address and correct the stressor making their life unbalanced. Understanding and applying boundaries will be explored and addressed for obtaining  and maintaining a balanced life. Patients will be encouraged to explore ways to assertively make their unbalanced needs known to significant others in their lives, using other group members and facilitator for support and feedback.  Therapeutic Goals: 1. Patient will identify two or more emotions or situations they have that consume much of in their lives. 2. Patient will identify signs/triggers that life has become out of balance:  3. Patient will identify two ways to set boundaries in order to achieve balance in their lives:  4. Patient will demonstrate ability to communicate their needs through discussion and/or role plays  Summary of Patient Progress:Pt identified financial, and mental emotional as areas that are out of balance.  Pt made several comments during group discussion regarding ways to address these areas in life.            Therapeutic Modalities:   Cognitive Behavioral Therapy Solution-Focused Therapy Assertiveness Training  Lurline Idol, [place]

## 2017-11-12 NOTE — Plan of Care (Signed)
  Safety: Periods of time without injury will increase 11/12/2017 0030 - Progressing by Providence Crosby, RN Note Pt safe on the unit
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
## 2017-11-15 ENCOUNTER — Telehealth: Payer: Self-pay

## 2017-11-16 NOTE — Telephone Encounter (Signed)
Calandra - How about sending her a MyChart note telling her that we would like her to schedule her TOC asap before whatever her 2 wk date is and what that appt is for and that I have plenty of "same day" appts avail for Thurs or Sat if she could come then. That way we can at least tell if she has received/read the note. Thanks for your help!

## 2017-11-16 NOTE — Telephone Encounter (Signed)
Attempted to call patient 3 times to complete TOC call/schedule visit with no success. Left messages for patient to call back.

## 2017-11-16 NOTE — Telephone Encounter (Signed)
MyChart message was sent to patient. Thank you for your help Dr. Brigitte Pulse! That was a great idea!

## 2017-11-27 ENCOUNTER — Other Ambulatory Visit: Payer: Self-pay

## 2017-11-27 ENCOUNTER — Encounter: Payer: Self-pay | Admitting: Family Medicine

## 2017-11-27 ENCOUNTER — Ambulatory Visit (INDEPENDENT_AMBULATORY_CARE_PROVIDER_SITE_OTHER): Payer: 59 | Admitting: Family Medicine

## 2017-11-27 VITALS — BP 140/80 | HR 105 | Temp 98.0°F | Resp 16 | Ht 68.11 in | Wt 180.0 lb

## 2017-11-27 DIAGNOSIS — F332 Major depressive disorder, recurrent severe without psychotic features: Secondary | ICD-10-CM

## 2017-11-27 MED ORDER — AMPHETAMINE-DEXTROAMPHET ER 10 MG PO CP24: 10.0000 mg | ORAL_CAPSULE | Freq: Every day | ORAL | 0 refills | Status: DC

## 2017-11-27 MED ORDER — DULOXETINE HCL 20 MG PO CPEP: 20.0000 mg | ORAL_CAPSULE | Freq: Two times a day (BID) | ORAL | 3 refills | Status: DC

## 2017-11-27 MED ORDER — HYDROXYZINE HCL 10 MG PO TABS: 10.0000 mg | ORAL_TABLET | Freq: Three times a day (TID) | ORAL | 1 refills | Status: DC | PRN

## 2017-11-27 MED ORDER — ESTRADIOL 1 MG PO TABS: 1.0000 mg | ORAL_TABLET | Freq: Every day | ORAL | 0 refills | Status: DC

## 2017-11-27 NOTE — Patient Instructions (Addendum)
IF you received an x-ray today, you will receive an invoice from [facility]. Please contact [facility] at [phone] with questions or concerns regarding your invoice.   IF you received labwork today, you will receive an invoice from [facility]. Please contact [facility] at [phone] with questions or concerns regarding your invoice.   Our billing staff will not be able to assist you with questions regarding bills from these companies.  You will be contacted with the lab results as soon as they are available. The fastest way to get your results is to activate your My Chart account. Instructions are located on the last page of this paperwork. If you have not heard from [place] regarding the results in 2 weeks, please contact this office.      Living With Depression Everyone experiences occasional disappointment, sadness, and loss in their lives. When you are feeling down, blue, or sad for at least 2 weeks in a row, it may mean that you have depression. Depression can affect your thoughts and feelings, relationships, daily activities, and physical health. It is caused by changes in the way your brain functions. If you receive a diagnosis of depression, your health care provider will tell you which type of depression you have and what treatment options are available to you. If you are living with depression, there are ways to help you recover from it and also ways to prevent it from coming back. How to cope with lifestyle changes Coping with stress Stress is your body's reaction to life changes and events, both good and bad. Stressful situations may include:  Getting married.  The death of a spouse.  Losing a job.  Retiring.  Having a baby.  Stress can last just a few hours or it can be ongoing. Stress can play a major role in depression, so it is important to learn both how to cope with stress and how to think about it differently. Talk with your health care  provider or a counselor if you would like to learn more about stress reduction. He or she may suggest some stress reduction techniques, such as:  Music therapy. This can include creating music or listening to music. Choose music that you enjoy and that inspires you.  Mindfulness-based meditation. This kind of meditation can be done while sitting or walking. It involves being aware of your normal breaths, rather than trying to control your breathing.  Centering prayer. This is a kind of meditation that involves focusing on a spiritual word or phrase. Choose a word, phrase, or sacred image that is meaningful to you and that brings you peace.  Deep breathing. To do this, expand your stomach and inhale slowly through your nose. Hold your breath for 3-5 seconds, then exhale slowly, allowing your stomach muscles to relax.  Muscle relaxation. This involves intentionally tensing muscles then relaxing them.  Choose a stress reduction technique that fits your lifestyle and personality. Stress reduction techniques take time and practice to develop. Set aside 5-15 minutes a day to do them. Therapists can offer training in these techniques. The training may be covered by some insurance plans. Other things you can do to manage stress include:  Keeping a stress diary. This can help you learn what triggers your stress and ways to control your response.  Understanding what your limits are and saying no to requests or events that lead to a schedule that is too full.  Thinking about how you respond to certain situations. You may not  be able to control everything, but you can control how you react.  Adding humor to your life by watching funny films or TV shows.  Making time for activities that help you relax and not feeling guilty about spending your time this way.  Medicines Your health care provider may suggest certain medicines if he or she feels that they will help improve your condition. Avoid using  alcohol and other substances that may prevent your medicines from working properly (may interact). It is also important to:  Talk with your pharmacist or health care provider about all the medicines that you take, their possible side effects, and what medicines are safe to take together.  Make it your goal to take part in all treatment decisions (shared decision-making). This includes giving input on the side effects of medicines. It is best if shared decision-making with your health care provider is part of your total treatment plan.  If your health care provider prescribes a medicine, you may not notice the full benefits of it for 4-8 weeks. Most people who are treated for depression need to be on medicine for at least 6-12 months after they feel better. If you are taking medicines as part of your treatment, do not stop taking medicines without first talking to your health care provider. You may need to have the medicine slowly decreased (tapered) over time to decrease the risk of harmful side effects. Relationships Your health care provider may suggest family therapy along with individual therapy and drug therapy. While there may not be family problems that are causing you to feel depressed, it is still important to make sure your family learns as much as they can about your mental health. Having your family's support can help make your treatment successful. How to recognize changes in your condition Everyone has a different response to treatment for depression. Recovery from major depression happens when you have not had signs of major depression for two months. This may mean that you will start to:  Have more interest in doing activities.  Feel less hopeless than you did 2 months ago.  Have more energy.  Overeat less often, or have better or improving appetite.  Have better concentration.  Your health care provider will work with you to decide the next steps in your recovery. It is also  important to recognize when your condition is getting worse. Watch for these signs:  Having fatigue or low energy.  Eating too much or too little.  Sleeping too much or too little.  Feeling restless, agitated, or hopeless.  Having trouble concentrating or making decisions.  Having unexplained physical complaints.  Feeling irritable, angry, or aggressive.  Get help as soon as you or your family members notice these symptoms coming back. How to get support and help from others How to talk with friends and family members about your condition Talking to friends and family members about your condition can provide you with one way to get support and guidance. Reach out to trusted friends or family members, explain your symptoms to them, and let them know that you are working with a health care provider to treat your depression. Financial resources Not all insurance plans cover mental health care, so it is important to check with your insurance carrier. If paying for co-pays or counseling services is a problem, search for a local or county mental health care center. They may be able to offer public mental health care services at low or no cost when you are  not able to see a private health care provider. If you are taking medicine for depression, you may be able to get the generic form, which may be less expensive. Some makers of prescription medicines also offer help to patients who cannot afford the medicines they need. Follow these instructions at home:  Get the right amount and quality of sleep.  Cut down on using caffeine, tobacco, alcohol, and other potentially harmful substances.  Try to exercise, such as walking or lifting small weights.  Take over-the-counter and prescription medicines only as told by your health care provider.  Eat a healthy diet that includes plenty of vegetables, fruits, whole grains, low-fat dairy products, and lean protein. Do not eat a lot of foods that are  high in solid fats, added sugars, or salt.  Keep all follow-up visits as told by your health care provider. This is important. Contact a health care provider if:  You stop taking your antidepressant medicines, and you have any of these symptoms: ? Nausea. ? Headache. ? Feeling lightheaded. ? Chills and body aches. ? Not being able to sleep (insomnia).  You or your friends and family think your depression is getting worse. Get help right away if:  You have thoughts of hurting yourself or others. If you ever feel like you may hurt yourself or others, or have thoughts about taking your own life, get help right away. You can go to your nearest emergency department or call:  Your local emergency services (911 in the [place]  A suicide crisis helpline, such as the [facility] at (559) 232-6375. This is open 24-hours a day.  Summary  If you are living with depression, there are ways to help you recover from it and also ways to prevent it from coming back.  Work with your health care team to create a management plan that includes counseling, stress management techniques, and healthy lifestyle habits. This information is not intended to replace advice given to you by your health care provider. Make sure you discuss any questions you have with your health care provider. Document Released: 08/10/2016 Document Revised: 08/10/2016 Document Reviewed: 08/10/2016 Elsevier Interactive Patient Education  2018 [employer] With Substance Use Disorder Having substance use disorder means that a person's repeated drug or alcohol use interferes with his or her ability to be productive. Alcohol or drug use may interfere with relationships and everyday activities, such as work or school. When a person has substance disorder, his or her condition can affect others around him or her, such as friends and family members. Friends and family can help by offering support  and understanding. What do I need to know about this condition? Substance use disorder can cause problems with mental and physical health. It can affect a person's ability to have healthy relationships and to meet responsibilities at home and at work or school. It can also lead to addiction. Symptoms associated with substance use disorder include:  Using a substance more than is normal.  Craving the substance or always thinking about it.  Trouble stopping substance use.  Spending a significant amount of time getting the substance, using it, or recovering from its effects.  Needing more and more of the substance to get the same effect (developing a tolerance).  Experiencing consequences of substance use, such as: ? Poor performance at work or school. ? Relationship problems. ? Financial or legal problems. ? Health problems.  The most commonly abused substances include:  Alcohol.  Tobacco.  Marijuana.  Stimulants, such as cocaine and methamphetamine.  Hallucinogens, such as LSD and PCP.  Opioids, such as some prescription pain medicines and heroin.  What do I need to know about the treatment options? Treatment for substance use disorder and recovery can be a long process. Your loved one's treatment may involve:  Stopping substance use safely. This may require taking medicines and being closely observed for several days.  Group or individual counseling from mental health providers. Your loved one may attend daily counseling sessions at a treatment center.  Staying at a residential treatment center for several days or weeks.  Going to a support group. These groups are an important part of long-term recovery for many people. They include 12-step programs like Alcoholics Anonymous (AA) and Narcotics Anonymous (NA).  Many people who undergo treatment start using the substance again after stopping. This is called a relapse. If your loved one has a relapse, that does not mean  that treatment will not work. Keep in mind that:  This disorder involves the brain, the body, and the people in a person's life (social system). Changing unhealthy behaviors is a complicated process that requires determination from your loved one.  Your loved one may need to try several times to recover.  Your support is important in helping your loved one to recover.  A responsible adult may need to stay with your loved one for some time after treatment. This person can help your loved one stay on track with recovery and can watch for symptoms that are getting worse. How can I support my loved one? Talk about the condition  Be careful about too much prodding. Try not to overdo reminders to an adult friend or family member about things like taking medicines. Ask how your loved one prefers that you help.  Explain that it is not easy to quit because substance use can change the part of the brain that gives someone self-control. Also, some people can easily become addicted because of their family genes.  Never ignore comments about suicide, and do not try to avoid the subject of suicide. Talking about suicide will not make your loved one want to act on it. You or your loved one can reach out 24 hours a day to get free, private support (on the phone or a live online chat) from a suicide crisis helpline, such as the [facility] at 423-671-4322.  Ask your loved one if you can go with him or her to meet with his or her health care provider or therapist. Ask if your loved one is open to giving you written permission to communicate with his or her providers if your loved one has problems. Find support and resources  Work with a health care provider who specializes in substance use disorders. Your loved one's primary care provider may be able to recommend a provider.  Refer your loved one to trusted online resources that can provide information about substance use disorders. A  health care provider may be able to recommend resources. You could start with: ? Government sites such as the Substance Abuse and Information systems manager [facility]): ktimeonline.com ? National mental health organizations such as the [employer] [address] [place]): www.nami.org  Look into local support groups or 12-step programs for your loved one.  Connect with people in peer and family support groups. People in these groups understand what you and your loved one are going through. They can help you feel a sense of  comfort and connect you with local resources to help you learn more. General support  Make an effort to learn all you can about substance use disorder.  Help your loved one follow his or her treatment plan as directed by health care providers. This could mean driving him or her to therapy sessions or support group meetings.  Tell your loved one that you will keep giving support as long as he or she follows the treatment plan.  Assure your loved one that even though treatment may be hard, it often works. Substance use disorder can be managed.  Encourage your loved one to avoid things, people, and situations (triggers) that may cause a relapse.  Talk with your loved one's treatment center staff or health care provider about how you can keep supporting your loved one during treatment, recovery, and relapses if necessary. How can I create a safe environment?  Talk with your loved one's health care provider about ways to lower the risk of harm. Based on the type of substance that your loved one is struggling with, his or her health care provider may recommend safety measures such as: ? Vaccinations. ? Medicines to prevent death from overdose. ? Referrals for a clean needle exchange program. ? Sexual health counseling.  If you believe that your loved one is driving while using drugs or alcohol, it is important to confront him or her about the dangers of driving  while drunk or high. In some cases, you may need to call the police to prevent harm to your loved one or others. How should I care for myself? It is important to find ways to care for your body, mind, and well-being while supporting someone with substance use disorder.  Join a support group for family members of addicts. Your loved one's treatment center may offer family support groups and other programs.  Consider individual therapy to help you learn to cope with your loved one's disorder.  Try to maintain your normal routines. This can help you remember that your life is about more than your loved one's condition.  Make time for activities that help you relax, and try to not feel guilty about taking time for yourself.  Be clear about limits and boundaries, especially if your loved one's behavior affects your well-being. Say "no" to requests or events that lead to a schedule that is too busy.  Eat a healthy diet, exercise regularly, and get plenty of sleep.  What are some signs that the condition is getting worse? Signs that your loved one's condition may be getting worse include:  New or more frequent symptoms.  Continuing to use more and more of the substance over time.  Continuing to use the substance even after using it has had negative consequences.  Denying that he or she has a problem.  Blaming you or others for his or her use.  Missing work or school or important events.  Physical symptoms that are associated with continued substance use.  A feeling in you that you are powerless to help your loved one get better.  Get help right away if:  Your loved one is showing signs that he or she is thinking about hurting himself, herself, or someone else. If you ever feel like your loved one may hurt himself or herself or others, or may have thoughts about taking his or her own life, get help right away. You can go to your nearest emergency department or call:  Your local  emergency services (911 in  the [place]  A suicide crisis helpline, such as the [facility] at (515) 567-1880. This is open 24 hours a day.  Summary  Having substance use disorder means that a person's repeated drug or alcohol use interferes with his or her ability to be productive.  Substance use disorder can be treated with therapy, group counseling, medicine, or staying at a residential treatment center.  Support from close friends and family members is vital for your loved one to overcome substance use disorder. The support that you provide can help your loved one through the tough treatment and recovery process.  It is important to find ways to care for yourself while supporting someone with substance use disorder. This information is not intended to replace advice given to you by your health care provider. Make sure you discuss any questions you have with your health care provider. Document Released: 01/19/2017 Document Revised: 01/19/2017 Document Reviewed: 01/19/2017 Elsevier Interactive Patient Education  2018 [employer].
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
## 2017-11-29 LAB — COMPREHENSIVE METABOLIC PANEL
A/G RATIO: 2.2 (ref 1.2–2.2)
ALT: 24 IU/L (ref 0–32)
AST: 21 IU/L (ref 0–40)
Albumin: 4.9 g/dL (ref 3.5–5.5)
Alkaline Phosphatase: 75 IU/L (ref 39–117)
BILIRUBIN TOTAL: 0.6 mg/dL (ref 0.0–1.2)
BUN/Creatinine Ratio: 24 — ABNORMAL HIGH (ref 9–23)
BUN: 16 mg/dL (ref 6–24)
CALCIUM: 10.2 mg/dL (ref 8.7–10.2)
CO2: 24 mmol/L (ref 20–29)
Chloride: 101 mmol/L (ref 96–106)
Creatinine, Ser: 0.66 mg/dL (ref 0.57–1.00)
GFR, EST AFRICAN AMERICAN: 119 mL/min/{1.73_m2} (ref >59)
GFR, EST NON AFRICAN AMERICAN: 103 mL/min/{1.73_m2} (ref >59)
GLOBULIN, TOTAL: 2.2 g/dL (ref 1.5–4.5)
Glucose: 125 mg/dL — ABNORMAL HIGH (ref 65–99)
POTASSIUM: 4.4 mmol/L (ref 3.5–5.2)
SODIUM: 143 mmol/L (ref 134–144)
Total Protein: 7.1 g/dL (ref 6.0–8.5)

## 2017-11-29 LAB — VITAMIN D 25 HYDROXY (VIT D DEFICIENCY, FRACTURES): VIT D 25 HYDROXY: 28.8 ng/mL — AB (ref 30.0–100.0)

## 2017-11-30 ENCOUNTER — Telehealth: Payer: Self-pay | Admitting: Family Medicine

## 2017-11-30 NOTE — Telephone Encounter (Signed)
Phone call to pharmacy. Spoke with Ulice Dash. Number to call for prior authorization is below:  [phone]  ID#: [id number]  Phone call to insurance company. Message received stating company is having technical issues, please call back in two hours.   Phone call to patient. Left detailed message per signed release RN will call tomorrow to start prior authorization. Please call back if any questions.

## 2017-11-30 NOTE — Telephone Encounter (Signed)
Copied from [place]. Topic: Quick Communication - Rx Refill/Question >> Nov 30, 2017  3:26 PM Scherrie Gerlach wrote: Medication:  amphetamine-dextroamphetamine (ADDERALL XR) 10 MG 24 hr capsule  Pt following up on this request for a prior auth she states the pharmacy has sent to the office, Pt hoping to start ASAP, and hopes you can expedite. [facility] [phone] - [place], [place] - [place] AT [place] [phone] (Phone) [phone] (Fax)  Pt states she may need the regular and not XR.  Ok whatever it takes.

## 2017-12-01 ENCOUNTER — Telehealth: Payer: Self-pay | Admitting: Family Medicine

## 2017-12-01 NOTE — Telephone Encounter (Signed)
Pt states her car has broke down and she would like you to fax the FMLA paperwork asap to  Fax:  [phone]  Pt aware waitng on Dr Brigitte Pulse to sign

## 2017-12-01 NOTE — Telephone Encounter (Signed)
Patient needs FMLA forms and disability forms completed for her recent hospital stay. I have completed what I could from the notes and highlighted what I was not sure about. I will place the forms in Dr Raul Del box on 12/01/17 please return to the FMLA/Disability box at the 102 checkout desk within 5-7 business days.  Thank you!

## 2017-12-01 NOTE — Telephone Encounter (Signed)
Paperwork was just received on 3/12 and processed on 3/13 provider has 5-7 business days to complete. Once completed I will fax to number provider.

## 2017-12-01 NOTE — Telephone Encounter (Signed)
Phone call to [place], spoke with [place]. Prior authorization for Adderall approved, #[id number] effective today. Expires 12/02/2018.   Phone call to [employer], spoke with [place]. She states patient has already picked up medication.   Phone call to patient. [place] notified of prior auth, she verbalizes understanding. Will follow up with pharmacy or clinic with further questions or concerns.

## 2017-12-02 NOTE — Telephone Encounter (Signed)
Pt called in to follow up on FMLA paperwork. Advised of the current status. Pt says that she placed a note on form to have it faxed to her pharmacy next. Pt says that she will call her employer to advised that it will not be sent to them today (deadline).      Pt would also like to make provider aware that the Adderall is working great for her. Pt says that she has so much energy now and feel so good. Pt did say that she noticed that it doesn't work for her all day so she would like to know if provider could prescribe a 5MG  tablet also?

## 2017-12-03 NOTE — Telephone Encounter (Signed)
In addition, pt would like to know if Dr Brigitte Pulse could write another rx for a 5 mg adderall for the afternoon.  [facility] [phone] - [place], [place] - [place] AT [place] [phone] (Phone) [phone] (Fax)

## 2017-12-03 NOTE — Telephone Encounter (Signed)
Pt calling to follow up on the FMLA paperwork. Pt is scheduled to auto terminate on Tuesday, so would appreciate this done asap. Pt really wants to get back to work.

## 2017-12-03 NOTE — Telephone Encounter (Signed)
Patient was advised at the time of dropping off the forms that there was no way they would be done in a day, and told of the time frame--pt needs to call employer and explain when she brought [place] the forms and our time frame on them. Paperwork has only been in the providers box since 12/01/17 today is the 2nd business day.

## 2017-12-04 ENCOUNTER — Telehealth: Payer: Self-pay | Admitting: Family Medicine

## 2017-12-04 NOTE — Telephone Encounter (Signed)
Pt. Wishes to touch base about the 5mg  tablet of adderall she asserts she discussed with the doctor.   Please Advise   Best Number [phone]

## 2017-12-06 NOTE — Telephone Encounter (Signed)
Form completed and returned to 102 FMLA box

## 2017-12-06 NOTE — Telephone Encounter (Signed)
Paperwork scanned and faxed as well as sent to pt through mychart on 12/06/17

## 2017-12-07 ENCOUNTER — Telehealth: Payer: Self-pay | Admitting: Family Medicine

## 2017-12-07 NOTE — Telephone Encounter (Signed)
Copied from [place]. Topic: General - Other >> Dec 07, 2017 10:21 AM Cecelia Byars, NT wrote: Reason for CRM: Patient called to follow up on request for 5 mg of adder all she would like a  tablet not a time  released  pill for the end of the day ,,please advise

## 2017-12-10 ENCOUNTER — Telehealth: Payer: Self-pay

## 2017-12-10 NOTE — Telephone Encounter (Signed)
Fax from [employer] insurance [employer] Amphet/Dextr Cap 10 mg Er previously approved on [id number] from 12/01/2017 through 12/02/2018.  Help Desk # [phone]

## 2017-12-10 NOTE — Telephone Encounter (Signed)
Pt called to f/u on med request.  Clarification: pt is wanting to add adderall 5mg  tab to take in the afternoon (in addition to the adderall xr 10mg  which she takes in the morning)  Pt asking for traZODone (DESYREL) 50 MG tablet to take at night for sleep. She was given this when she was at the hospital. Please advise.  [facility] [phone] - [place], [place] - [place] AT [address] [phone] (Phone) [phone] (Fax)

## 2017-12-10 NOTE — Telephone Encounter (Signed)
See other duplicated telephone call from 12/07/17 - rx'd in there.

## 2017-12-12 MED ORDER — AMPHETAMINE-DEXTROAMPHETAMINE 5 MG PO TABS
5.0000 mg | ORAL_TABLET | Freq: Every day | ORAL | 0 refills | Status: DC
Start: 2017-12-12 — End: 2017-12-24

## 2017-12-12 MED ORDER — TRAZODONE HCL 50 MG PO TABS: 50.0000 mg | ORAL_TABLET | Freq: Every evening | ORAL | 0 refills | Status: DC | PRN

## 2017-12-12 NOTE — Addendum Note (Signed)
Addended by: [name] on: 12/12/2017 07:04 PM   Modules accepted: Orders

## 2017-12-12 NOTE — Telephone Encounter (Signed)
Sent both in. Needs OV for refills

## 2017-12-13 NOTE — Telephone Encounter (Signed)
Pt said she needs a PA on amphetamine-dextroamphetamine (ADDERALL) 5 MG tablet. Please advise

## 2017-12-13 NOTE — Telephone Encounter (Signed)
Phone call to patient. Per signed release, left detailed message for patient stating adderall 5mg  and trazodone 50mg  is at [facility] [address], office visit needed for further refills.   If patient calls back, please schedule her for office visit with Dr. Brigitte Pulse.

## 2017-12-14 NOTE — Telephone Encounter (Signed)
Phone call to pharmacy.   Patient's ID # is [id number], phone number for plan is [phone].  Phone call to patient insurance plan, spoke with [place]. She states she will send over paperwork for prior authorization.   Phone call to patient. Per signed authorization, left detailed message for patient with update on medication prior authorization. [facility] is sending over paperwork for prior authorization, please call back if any questions.

## 2017-12-16 NOTE — Telephone Encounter (Signed)
Paperwork put in providers box.

## 2017-12-23 ENCOUNTER — Telehealth: Payer: Self-pay | Admitting: Family Medicine

## 2017-12-23 NOTE — Telephone Encounter (Signed)
**  LVM FOR PT TO CALL BACK AND EITHER RESCHEDULE WITH SHAW FOR A DIFFERENT DAY OR COME IN EARLIER ON SATURDAY TO SEE MANI.**CC

## 2017-12-24 ENCOUNTER — Ambulatory Visit (INDEPENDENT_AMBULATORY_CARE_PROVIDER_SITE_OTHER): Payer: 59 | Admitting: Family Medicine

## 2017-12-24 ENCOUNTER — Encounter: Payer: Self-pay | Admitting: Family Medicine

## 2017-12-24 ENCOUNTER — Other Ambulatory Visit: Payer: Self-pay

## 2017-12-24 VITALS — BP 134/78 | HR 87 | Temp 98.6°F | Resp 16 | Ht 68.11 in | Wt 184.6 lb

## 2017-12-24 DIAGNOSIS — F332 Major depressive disorder, recurrent severe without psychotic features: Secondary | ICD-10-CM

## 2017-12-24 DIAGNOSIS — F900 Attention-deficit hyperactivity disorder, predominantly inattentive type: Secondary | ICD-10-CM

## 2017-12-24 MED ORDER — HYDROXYZINE HCL 25 MG PO TABS: 25.0000 mg | ORAL_TABLET | Freq: Every evening | ORAL | 0 refills | Status: DC | PRN

## 2017-12-24 MED ORDER — AMPHETAMINE-DEXTROAMPHET ER 20 MG PO CP24: 20.0000 mg | ORAL_CAPSULE | Freq: Every day | ORAL | 0 refills | Status: DC

## 2017-12-24 MED ORDER — DULOXETINE HCL 60 MG PO CPEP: 60.0000 mg | ORAL_CAPSULE | Freq: Every day | ORAL | 1 refills | Status: DC

## 2017-12-24 MED ORDER — AMPHETAMINE-DEXTROAMPHETAMINE 5 MG PO TABS: 5.0000 mg | ORAL_TABLET | Freq: Two times a day (BID) | ORAL | 0 refills | Status: DC | PRN

## 2017-12-24 NOTE — Progress Notes (Signed)
4/5/20192:00 PM  Crystal Williamson 05-05-1966, 52 y.o. female [id number]  Chief Complaint  Patient presents with  . Medication Refill    vistaril, adderall    HPI:   Patient is a 51 y.o. female with past medical history significant for severe depression and ADHD who presents today for request of medications refilled.  PCP Dr Brigitte Pulse, had follow-up appt rescheduled by clinic  [facility] hosp feb 2019 Taking cymbalta 20mg  BID, tolerating well, no side effects, mood better but not at goal, still struggling with interest/wanting to do things/enjoyment. PHQ9 noted, no SI. ADHD - she increased her aderrall to 20mg  a day, ran out early so using her 5 mg to make up, feels that she is able to concentrate well, complete tasks at work and home, denies any side effects, denies any impact on appetite or sleep, denies any increase in irritability or palpitations Takes vistaril and trazodone prn for sleep.  She has seen counseling once, has not seen psychiatry yet  Depression screen [facility] 2/9 12/24/2017 11/27/2017 07/16/2016  Decreased Interest 3 0 0  Down, Depressed, Hopeless 3 0 0  PHQ - 2 Score 6 0 0  Altered sleeping 0 - -  Tired, decreased energy 3 - -  Change in appetite 0 - -  Feeling bad or failure about yourself  2 - -  Trouble concentrating 0 - -  Moving slowly or fidgety/restless 2 - -  Suicidal thoughts 0 - -  PHQ-9 Score 13 - -  Difficult doing work/chores Very difficult - -    Allergies  Allergen Reactions  . Codeine Itching    REACTION: N \\T \ V  . Hydromorphone Itching  . Opium   . Percocet [Oxycodone-Acetaminophen] Itching    Prior to Admission medications   Medication Sig Start Date End Date Taking? Authorizing Provider  amphetamine-dextroamphetamine (ADDERALL XR) 10 MG 24 hr capsule Take 1 capsule (10 mg total) by mouth daily. 11/27/17  Yes [name], MD  amphetamine-dextroamphetamine (ADDERALL) 5 MG tablet Take 1 tablet (5 mg total) by mouth daily. 12/12/17  Yes [name],  MD  DULoxetine (CYMBALTA) 20 MG capsule Take 1 capsule (20 mg total) by mouth 2 (two) times daily. 11/27/17  Yes [name], MD  estradiol (ESTRACE) 1 MG tablet Take 1 tablet (1 mg total) by mouth daily. 11/27/17  Yes [name], MD  hydrOXYzine (ATARAX/VISTARIL) 10 MG tablet Take 1 tablet (10 mg total) by mouth 3 (three) times daily as needed for anxiety. 11/27/17  Yes [name], MD  hydrOXYzine (ATARAX/VISTARIL) 25 MG tablet Take 1 tablet (25 mg total) by mouth 3 (three) times daily as needed for anxiety. 11/12/17  Yes Money, Lowry Ram, FNP  traZODone (DESYREL) 50 MG tablet Take 1 tablet (50 mg total) by mouth at bedtime as needed for sleep. 12/12/17  Yes [name], MD    Past Medical History:  Diagnosis Date  . Allergy   . Depression   . Fibroid   . Headache(784.0)   . No pertinent past medical history   . Varicose veins    bilateral    Past Surgical History:  Procedure Laterality Date  . ABDOMINAL HYSTERECTOMY  06/01/2012   Procedure: HYSTERECTOMY ABDOMINAL;  Surgeon: Terrance Mass, MD;  Location: [facility];  Service: Gynecology;  Laterality: N/A;  . SEPTOPLASTY    . VARICOSE VEIN SURGERY    . WISDOM TOOTH EXTRACTION      Social History   Tobacco Use  .  Smoking status: Current Every Day Smoker    Packs/day: 0.25    Types: Cigarettes  . Smokeless tobacco: Never Used  . Tobacco comment: 5 cig a day  Substance Use Topics  . Alcohol use: Yes    Alcohol/week: 0.0 oz    Comment: 3-4x week     Family History  Problem Relation Age of Onset  . Colon cancer Paternal Grandfather   . Diabetes Paternal Grandfather        type 2   . Cancer Paternal Grandfather        COLON  . Breast cancer Paternal Grandmother   . Stroke Maternal Grandmother   . Lupus Mother   . Lumbar disc disease Mother   . Varicose Veins Mother   . Cancer Maternal Grandfather   . Diabetes Maternal Grandfather     ROS Per hpi  OBJECTIVE:  Blood pressure 134/78, pulse 87, temperature 98.6 F (37 C),  temperature source Oral, resp. rate 16, height 5' 8.11" (1.73 m), weight 184 lb 9.6 oz (83.7 kg), last menstrual period 02/08/2012, SpO2 98 %.  Physical Exam  Constitutional: She is oriented to person, place, and time and well-developed, well-nourished, and in no distress.  HENT:  Head: Normocephalic and atraumatic.  Mouth/Throat: Mucous membranes are normal.  Eyes: Pupils are equal, round, and reactive to light. EOM are normal. No scleral icterus.  Neck: Neck supple.  Pulmonary/Chest: Effort normal.  Neurological: She is alert and oriented to person, place, and time. Gait normal.  Skin: Skin is warm and dry.  Psychiatric: Mood and affect normal.  Nursing note and vitals reviewed.    ASSESSMENT and PLAN  1. Severe episode of recurrent major depressive disorder, without psychotic features ([place]) 2. Attention deficit hyperactivity disorder (ADHD), predominantly inattentive type  Overall patient doing better, depression not completely controlled on current dose. Therefore increasing cymbalta to 60mg  once a day. Also adjusting adderral dose to 20mg  once a day as patient reports ADD sx controlled at that dose. Will continue with small prescription of 5mg  to be used prn. [place] CSR reviewed. Medications were changed to daily dosing regimes for simplification and improved bioavailability. Patient to continue with counseling, pending psychiatry appointment. Patient to followup with PCP as scheduled.   Other orders - hydrOXYzine (ATARAX/VISTARIL) 25 MG tablet; Take 1 tablet (25 mg total) by mouth at bedtime as needed for anxiety. - DULoxetine (CYMBALTA) 60 MG capsule; Take 1 capsule (60 mg total) by mouth daily. - amphetamine-dextroamphetamine (ADDERALL XR) 20 MG 24 hr capsule; Take 1 capsule (20 mg total) by mouth daily. - amphetamine-dextroamphetamine (ADDERALL) 5 MG tablet; Take 1 tablet (5 mg total) by mouth 2 (two) times daily as needed.  Return in about 3 weeks (around 01/14/2018) for Dr  Brigitte Pulse.    Rutherford Guys, MD Primary Care at [place], [place] [address] Ph.  [phone] Fax [phone]

## 2017-12-24 NOTE — Patient Instructions (Signed)
     IF you received an x-ray today, you will receive an invoice from [facility]. Please contact [facility] at [phone] with questions or concerns regarding your invoice.   IF you received labwork today, you will receive an invoice from [facility]. Please contact [facility] at [phone] with questions or concerns regarding your invoice.   Our billing staff will not be able to assist you with questions regarding bills from these companies.  You will be contacted with the lab results as soon as they are available. The fastest way to get your results is to activate your My Chart account. Instructions are located on the last page of this paperwork. If you have not heard from us regarding the results in 2 weeks, please contact this office.    We recommend that you schedule a mammogram for breast cancer screening. Typically, you do not need a referral to do this. Please contact a local imaging center to schedule your mammogram.  [facility] - [phone]  *ask for the Radiology Department [facility] - [phone] or [phone]  [facility] - [phone] [facility] - [phone] [facility] - [phone]  *ask for the Radiology Department [facility] - [phone]  *ask for the Radiology Department [facility] - [phone]  *ask for the Mammography Department [facility] - [phone] 

## 2017-12-25 ENCOUNTER — Ambulatory Visit: Payer: 59 | Admitting: Family Medicine

## 2017-12-29 ENCOUNTER — Telehealth: Payer: Self-pay

## 2017-12-29 NOTE — Telephone Encounter (Signed)
PA forms for Adderall placed in Shaws box for her review and signature

## 2018-01-03 ENCOUNTER — Telehealth: Payer: Self-pay | Admitting: *Deleted

## 2018-01-03 NOTE — Telephone Encounter (Signed)
Approved 5 mg amphet.

## 2018-01-03 NOTE — Telephone Encounter (Signed)
Left message prescription has been approved can

## 2018-01-06 ENCOUNTER — Ambulatory Visit: Payer: 59 | Admitting: Family Medicine

## 2018-01-19 ENCOUNTER — Other Ambulatory Visit: Payer: Self-pay | Admitting: Family Medicine

## 2018-01-20 ENCOUNTER — Other Ambulatory Visit: Payer: Self-pay | Admitting: Family Medicine

## 2018-01-20 NOTE — Telephone Encounter (Signed)
Copied from [place] [id number]. Topic: Quick Communication - Rx Refill/Question >> Jan 20, 2018  5:21 PM Selinda Flavin B, NT wrote: Medication: amphetamine-dextroamphetamine (ADDERALL XR) 20 MG 24 hr capsule Has the patient contacted their pharmacy? Yes.   (Agent: If no, request that the patient contact the pharmacy for the refill.) Preferred Pharmacy (with phone number or street name): [facility] [place], [place] - [place]: Please be advised that RX refills may take up to 3 business days. We ask that you follow-up with your pharmacy.

## 2018-01-21 NOTE — Telephone Encounter (Signed)
LOV 12/24/17 Dr. Brigitte Pulse Last refill 12/24/17  # 30  0 refills

## 2018-01-25 NOTE — Telephone Encounter (Signed)
Pt following up on RX request. She has been out for a few days. She has appt scheduled 01/28/18 with Dr. Brigitte Pulse. Please advise. Updated pt phone # [phone].   [facility] [phone] - [place], [place] - [address] AT [address] [phone] (Phone) [phone] (Fax)

## 2018-01-25 NOTE — Addendum Note (Signed)
Addended by: Delle Reining on: 01/25/2018 05:07 PM   Modules accepted: Orders

## 2018-01-26 MED ORDER — AMPHETAMINE-DEXTROAMPHET ER 20 MG PO CP24: 20.0000 mg | ORAL_CAPSULE | Freq: Every day | ORAL | 0 refills | Status: DC

## 2018-01-28 ENCOUNTER — Other Ambulatory Visit: Payer: Self-pay

## 2018-01-28 ENCOUNTER — Encounter: Payer: Self-pay | Admitting: Family Medicine

## 2018-01-28 ENCOUNTER — Ambulatory Visit (INDEPENDENT_AMBULATORY_CARE_PROVIDER_SITE_OTHER): Payer: 59 | Admitting: Family Medicine

## 2018-01-28 VITALS — BP 112/74 | HR 74 | Temp 98.3°F | Resp 18 | Ht 68.11 in | Wt 186.2 lb

## 2018-01-28 DIAGNOSIS — F332 Major depressive disorder, recurrent severe without psychotic features: Secondary | ICD-10-CM | POA: Diagnosis not present

## 2018-01-28 DIAGNOSIS — Z79899 Other long term (current) drug therapy: Secondary | ICD-10-CM

## 2018-01-28 DIAGNOSIS — Z79890 Hormone replacement therapy (postmenopausal): Secondary | ICD-10-CM | POA: Diagnosis not present

## 2018-01-28 DIAGNOSIS — F900 Attention-deficit hyperactivity disorder, predominantly inattentive type: Secondary | ICD-10-CM | POA: Diagnosis not present

## 2018-01-28 MED ORDER — ESTRADIOL 1 MG PO TABS: 1.0000 mg | ORAL_TABLET | Freq: Every day | ORAL | 0 refills | Status: DC

## 2018-01-28 MED ORDER — TRAZODONE HCL 50 MG PO TABS: 50.0000 mg | ORAL_TABLET | Freq: Every evening | ORAL | 0 refills | Status: DC | PRN

## 2018-01-28 MED ORDER — AMPHETAMINE-DEXTROAMPHETAMINE 5 MG PO TABS: 5.0000 mg | ORAL_TABLET | Freq: Two times a day (BID) | ORAL | 0 refills | Status: DC | PRN

## 2018-01-28 MED ORDER — HYDROXYZINE HCL 25 MG PO TABS: ORAL_TABLET | ORAL | 1 refills | Status: DC

## 2018-01-28 MED ORDER — DULOXETINE HCL 60 MG PO CPEP: 60.0000 mg | ORAL_CAPSULE | Freq: Every day | ORAL | 1 refills | Status: DC

## 2018-01-28 NOTE — Patient Instructions (Addendum)
We recommend that you schedule a mammogram for breast cancer screening. Typically, you do not need a referral to do this. Please contact a local imaging center to schedule your mammogram.  [facility] - [phone]  *ask for the Radiology Department [facility] ([place]) - [phone] or [phone]  [facility] - [phone] [place] [phone] [facility] - [phone]  *ask for the [facility] - [phone]  *ask for the Radiology Department [facility] - [phone]  *ask for the [place] - [phone]    IF you received an x-ray today, you will receive an invoice from [facility] Radiology. Please contact [facility] Radiology at [phone] with questions or concerns regarding your invoice.   IF you received labwork today, you will receive an invoice from [facility]. Please contact [facility] at [phone] with questions or concerns regarding your invoice.   Our billing staff will not be able to assist you with questions regarding bills from these companies.  You will be contacted with the lab results as soon as they are available. The fastest way to get your results is to activate your My Chart account. Instructions are located on the last page of this paperwork. If you have not heard from [place] regarding the results in 2 weeks, please contact this office.    Menopause and Herbal Products What is menopause? Menopause is the normal time of life when menstrual periods decrease in frequency and eventually stop completely. This process can take several years for some women. Menopause is complete when you have had an absence of menstruation for a full year since your last menstrual period. It usually occurs between the ages of 79 and 70. It is not common for menopause to begin before the age of 53. During menopause, your body stops  producing the female hormones estrogen and progesterone. Common symptoms associated with this loss of hormones (vasomotor symptoms) are:  Hot flashes.  Hot flushes.  Night sweats.  Other common symptoms and complications of menopause include:  Decrease in sex drive.  Vaginal dryness and thinning of the walls of the vagina. This can make sex painful.  Dryness of the skin and development of wrinkles.  Headaches.  Tiredness.  Irritability.  Memory problems.  Weight gain.  Bladder infections.  Hair growth on the face and chest.  Inability to reproduce offspring (infertility).  Loss of density in the bones (osteoporosis) increasing your risk for breaks (fractures).  Depression.  Hardening and narrowing of the arteries (atherosclerosis). This increases your risk of heart attack and stroke.  What treatment options are available? There are many treatment choices for menopause symptoms. The most common treatment is hormone replacement therapy. Many alternative therapies for menopause are emerging, including the use of herbal products. These supplements can be found in the form of herbs, teas, oils, tinctures, and pills. Common herbal supplements for menopause are made from plants that contain phytoestrogens. Phytoestrogens are compounds that occur naturally in plants and plant products. They act like estrogen in the body. Foods and herbs that contain phytoestrogens include:  Soy.  Flax seeds.  Red clover.  Ginseng.  What menopause symptoms may be helped if I use herbal products?  Vasomotor symptoms. These may be helped by: ? Soy. Some studies show that soy may have a moderate benefit for hot flashes. ? Black cohosh. There is limited evidence indicating this may be beneficial for hot flashes.  Symptoms that are related to heart and blood vessel disease. These may be helped by soy. Studies have shown that soy can help to lower cholesterol.  Depression. This may be  helped by: ? St. John's wort. There is limited evidence that shows this may help mild to moderate depression. ? Black cohosh. There is evidence that this may help depression and mood swings.  Osteoporosis. Soy may help to decrease bone loss that is associated with menopause and may prevent osteoporosis. Limited evidence indicates that red clover may offer some bone loss protection as well. Other herbal products that are commonly used during menopause lack enough evidence to support their use as a replacement for conventional menopause therapies. These products include evening primrose, ginseng, and red clover. What are the cases when herbal products should not be used during menopause? Do not use herbal products during menopause without your health care provider's approval if:  You are taking medicine.  You have a preexisting liver condition.  Are there any risks in my taking herbal products during menopause? If you choose to use herbal products to help with symptoms of menopause, keep in mind that:  Different supplements have different and unmeasured amounts of herbal ingredients.  Herbal products are not regulated the same way that medicines are.  Concentrations of herbs may vary depending on the way they are prepared. For example, the concentration may be different in a pill, tea, oil, and tincture.  Little is known about the risks of using herbal products, particularly the risks of long-term use.  Some herbal supplements can be harmful when combined with certain medicines.  Most commonly reported side effects of herbal products are mild. However, if used improperly, many herbal supplements can cause serious problems. Talk to your health care provider before starting any herbal product. If problems develop, stop taking the supplement and let your health care provider know. This information is not intended to replace advice given to you by your health care provider. Make sure you discuss  any questions you have with your health care provider. Document Released: 02/24/2008 Document Revised: 08/04/2016 Document Reviewed: 02/20/2014 Elsevier Interactive Patient Education  2017 [employer].  Menopause and Hormone Replacement Therapy What is hormone replacement therapy? Hormone replacement therapy (HRT) is the use of artificial (synthetic) hormones to replace hormones that your body stops producing during menopause. Menopause is the normal time of life when menstrual periods stop completely and the ovaries stop producing the female hormones estrogen and progesterone. This lack of hormones can affect your health and cause undesirable symptoms. HRT can relieve some of those symptoms. What are my options for HRT? HRT may consist of the synthetic hormones estrogen and progestin, or it may consist of only estrogen (estrogen-only therapy). You and your health care provider will decide which form of HRT is best for you. If you choose to be on HRT and you have a uterus, estrogen and progestin are usually prescribed. Estrogen-only therapy is used for women who do not have a uterus. Possible options for taking HRT include:  Pills.  Patches.  Gels.  Sprays.  Vaginal cream.  Vaginal rings.  Vaginal inserts.  The amount of hormone(s) that you take and how long you take the hormone(s) varies depending on your individual health. It is important to:  Begin HRT with the lowest possible dosage.  Stop HRT as soon as your health care provider tells you to stop.  Work with your health care provider so that you feel informed and comfortable with  your decisions.  What are the benefits of HRT? HRT can reduce the frequency and severity of menopausal symptoms. Benefits of HRT vary depending on the menopausal symptoms that you have, the severity of your symptoms, and your overall health. HRT may help to improve the following menopausal symptoms:  Hot flashes and night sweats. These are  sudden feelings of heat that spread over the face and body. The skin may turn red, like a blush. Night sweats are hot flashes that happen while you are sleeping or trying to sleep.  Bone loss (osteoporosis). The body loses calcium more quickly after menopause, causing the bones to become weaker. This can increase the risk for bone breaks (fractures).  Vaginal dryness. The lining of the vagina can become thin and dry, which can cause pain during sexual intercourse or cause infection, burning, or itching.  Urinary tract infections.  Urinary incontinence. This is a decreased ability to control when you urinate.  Irritability.  Short-term memory problems.  What are the risks of HRT? Risks of HRT vary depending on your individual health and medical history. Risks of HRT also depend on whether you receive both estrogen and progestin or you receive estrogen only.HRT may increase the risk of:  Spotting. This is when a small amount of bloodleaks from the vagina unexpectedly.  Endometrial cancer. This cancer is in the lining of the uterus (endometrium).  Breast cancer.  Increased density of breast tissue. This can make it harder to find breast cancer on a breast X-ray (mammogram).  Stroke.  Heart attack.  Blood clots.  Gallbladder disease.  Risks of HRT can increase if you have any of the following conditions:  Endometrial cancer.  Liver disease.  Heart disease.  Breast cancer.  History of blood clots.  History of stroke.  How should I care for myself while I am on HRT?  Take over-the-counter and prescription medicines only as told by your health care provider.  Get mammograms, pelvic exams, and medical checkups as often as told by your health care provider.  Have Pap tests done as often as told by your health care provider. A Pap test is sometimes called a Pap smear. It is a screening test that is used to check for signs of cancer of the cervix and vagina. A Pap test can  also identify the presence of infection or precancerous changes. Pap tests may be done: ? Every 3 years, starting at age 36. ? Every 5 years, starting after age 83, in combination with testing for human papillomavirus (HPV). ? More often or less often depending on other medical conditions you have, your age, and other risk factors.  It is your responsibility to get your Pap test results. Ask your health care provider or the department performing the test when your results will be ready.  Keep all follow-up visits as told by your health care provider. This is important. When should I seek medical care? Talk with your health care provider if:  You have any of these: ? Pain or swelling in your legs. ? Shortness of breath. ? Chest pain. ? Lumps or changes in your breasts or armpits. ? Slurred speech. ? Pain, burning, or bleeding when you urine.  You develop any of these: ? Unusual vaginal bleeding. ? Dizziness or headaches. ? Weakness or numbness in any part of your arms or legs. ? Pain in your abdomen.  This information is not intended to replace advice given to you by your health care provider. Make sure you  discuss any questions you have with your health care provider. Document Released: 06/06/2003 Document Revised: 08/04/2016 Document Reviewed: 03/11/2015 Elsevier Interactive Patient Education  2017 [employer].

## 2018-01-28 NOTE — Progress Notes (Signed)
Subjective:  By signing my name below, I, Crystal Williamson, attest that this documentation has been prepared under the direction and in the presence of Crystal Cheadle, MD Electronically Signed: Ladene Williamson, ED Scribe 01/28/2018 at 1:10 PM.   Patient ID: Crystal Williamson, female    DOB: 03-29-1966, 52 y.o.   MRN: [id number]  Chief Complaint  Patient presents with  . Medication Refill    Pt requesting 3 month supply for all meds.  . Medication Management    Pt requesting increase dosage for Trazadone 50 Mg. Pt would like an RX for Omega 3 and a probitotic   HPI Crystal Williamson is a 52 y.o. female who presents to Primary Care at [facility] for medication management. Pt states that Adderall allows her to focus but she sometimes feels like it's too strong and makes her feel too "wired". Some days she takes 5 mg, 20 mg, 25 mg and some days she doesn't take any; depends on the day. States her mood has been stable and she has been doing things she enjoys. She is having 2-3 cups of coffee in the mornings. Pt has not taken her medication yet today. She usually takes it when she goes to work. Her schedule alternates from 8am-3pm and 2pm-10pm. Trazodone and Hydroxyzine helps her sleep but she would like to increase Trazodone to keep her asleep. States she occassionally has hot flashes at night that wakes her. She has tried Garden of Life multivitamin and black cohosh. Pt states that she has not had an alcoholic beverage in a while. Her previous GYN Dr. Toney Rakes retired so she has not seen GYN since. Psh includes hysterectomy without oophorectomy. She has a BP monitor at work.  Past Medical History:  Diagnosis Date  . Allergy   . Depression   . Fibroid   . Headache(784.0)   . No pertinent past medical history   . Varicose veins    bilateral   Current Outpatient Medications on File Prior to Visit  Medication Sig Dispense Refill  . amphetamine-dextroamphetamine (ADDERALL XR) 20 MG 24 hr capsule Take 1  capsule (20 mg total) by mouth daily. NO REFILLS WITHOUT OFFICE VISIT 30 capsule 0  . amphetamine-dextroamphetamine (ADDERALL) 5 MG tablet Take 1 tablet (5 mg total) by mouth 2 (two) times daily as needed. 45 tablet 0  . DULoxetine (CYMBALTA) 60 MG capsule Take 1 capsule (60 mg total) by mouth daily. 30 capsule 1  . estradiol (ESTRACE) 1 MG tablet Take 1 tablet (1 mg total) by mouth daily. 90 tablet 0  . hydrOXYzine (ATARAX/VISTARIL) 25 MG tablet TAKE 1 TABLET BY MOUTH AT BEDTIME AS NEEDED FOR ANXIETY 30 tablet 0  . traZODone (DESYREL) 50 MG tablet Take 1 tablet (50 mg total) by mouth at bedtime as needed for sleep. 90 tablet 0   No current facility-administered medications on file prior to visit.     Past Surgical History:  Procedure Laterality Date  . ABDOMINAL HYSTERECTOMY  06/01/2012   Procedure: HYSTERECTOMY ABDOMINAL;  Surgeon: Terrance Mass, MD;  Location: [facility];  Service: Gynecology;  Laterality: N/A;  . SEPTOPLASTY    . VARICOSE VEIN SURGERY    . WISDOM TOOTH EXTRACTION      Allergies  Allergen Reactions  . Codeine Itching    REACTION: N \\T \ V  . Hydromorphone Itching  . Opium   . Percocet [Oxycodone-Acetaminophen] Itching   Family History  Problem Relation Age of Onset  . Colon cancer Paternal Grandfather   .  Diabetes Paternal Grandfather        type 2   . Cancer Paternal Grandfather        COLON  . Breast cancer Paternal Grandmother   . Stroke Maternal Grandmother   . Lupus Mother   . Lumbar disc disease Mother   . Varicose Veins Mother   . Cancer Maternal Grandfather   . Diabetes Maternal Grandfather    Social History   Socioeconomic History  . Marital status: Widowed    Spouse name: Not on file  . Number of children: Not on file  . Years of education: Not on file  . Highest education level: Not on file  Occupational History  . Not on file  Social Needs  . Financial resource strain: Not on file  . Food insecurity:    Worry: Not on file     Inability: Not on file  . Transportation needs:    Medical: Not on file    Non-medical: Not on file  Tobacco Use  . Smoking status: Current Every Day Smoker    Packs/day: 0.25    Types: Cigarettes  . Smokeless tobacco: Never Used  . Tobacco comment: 5 cig a day  Substance and Sexual Activity  . Alcohol use: Yes    Alcohol/week: 0.0 oz    Comment: 3-4x week   . Drug use: No  . Sexual activity: Not Currently    Birth control/protection: None, Surgical  Lifestyle  . Physical activity:    Days per week: Not on file    Minutes per session: Not on file  . Stress: Not on file  Relationships  . Social connections:    Talks on phone: Not on file    Gets together: Not on file    Attends religious service: Not on file    Active member of club or organization: Not on file    Attends meetings of clubs or organizations: Not on file    Relationship status: Not on file  Other Topics Concern  . Not on file  Social History Narrative   ** Merged History Encounter **       Depression screen [facility] 2/9 01/28/2018 12/24/2017 12/24/2017 11/27/2017 07/16/2016  Decreased Interest 0 2 3 0 0  Down, Depressed, Hopeless 0 1 3 0 0  PHQ - 2 Score 0 3 6 0 0  Altered sleeping - 1 0 - -  Tired, decreased energy - 3 3 - -  Change in appetite - 1 0 - -  Feeling bad or failure about yourself  - 2 2 - -  Trouble concentrating - 1 0 - -  Moving slowly or fidgety/restless - 1 2 - -  Suicidal thoughts - 0 0 - -  PHQ-9 Score - 12 13 - -  Difficult doing work/chores - Very difficult Very difficult - -     Review of Systems  Constitutional: Positive for diaphoresis.  Psychiatric/Behavioral: Positive for sleep disturbance. Negative for dysphoric mood.      Objective:   Physical Exam  Constitutional: She is oriented to person, place, and time. She appears well-developed and well-nourished. No distress.  HENT:  Head: Normocephalic and atraumatic.  Eyes: Conjunctivae and EOM are normal.  Neck: Neck supple. No  tracheal deviation present.  Cardiovascular: Normal rate and regular rhythm.  Pulmonary/Chest: Effort normal and breath sounds normal. No respiratory distress.  Musculoskeletal: Normal range of motion.  Neurological: She is alert and oriented to person, place, and time.  Skin: Skin is warm and  dry.  Psychiatric: She has a normal mood and affect. Her behavior is normal.  Nursing note and vitals reviewed.  BP 112/74 (BP Location: Left Arm, Patient Position: Sitting, Cuff Size: Normal)   Pulse 74   Temp 98.3 F (36.8 C) (Oral)   Resp 18   Ht 5' 8.11" (1.73 m)   Wt 186 lb 3.2 oz (84.5 kg)   LMP 02/08/2012   SpO2 99%   BMI 28.22 kg/m     Assessment & Plan:   1. Postmenopausal hormone replacement therapy   2. High risk medication use   3. Attention deficit hyperactivity disorder (ADHD), predominantly inattentive type   4. MDD (major depressive disorder), recurrent severe, without psychosis ([place])   Continue same med doses except increase trazodone from 50 to 100. Rehceck in 3 mos unless needs further changes - then recheck sooner.  Orders Placed This Encounter  Procedures  . ToxASSURE Select 13 (MW), Urine    Meds ordered this encounter  Medications  . DULoxetine (CYMBALTA) 60 MG capsule    Sig: Take 1 capsule (60 mg total) by mouth daily.    Dispense:  90 capsule    Refill:  1  . traZODone (DESYREL) 50 MG tablet    Sig: Take 1-2 tablets (50-100 mg total) by mouth at bedtime as needed for sleep.    Dispense:  180 tablet    Refill:  0  . hydrOXYzine (ATARAX/VISTARIL) 25 MG tablet    Sig: TAKE 1 TABLET BY MOUTH AT BEDTIME AS NEEDED FOR ANXIETY    Dispense:  90 tablet    Refill:  1  . estradiol (ESTRACE) 1 MG tablet    Sig: Take 1 tablet (1 mg total) by mouth daily.    Dispense:  90 tablet    Refill:  0  . amphetamine-dextroamphetamine (ADDERALL) 5 MG tablet    Sig: Take 1 tablet (5 mg total) by mouth 2 (two) times daily as needed.    Dispense:  45 tablet    Refill:  0     I personally performed the services described in this documentation, which was scribed in my presence. The recorded information has been reviewed and considered, and addended by me as needed.   Crystal Williamson, M.D.  Primary Care at [facility] [address] [place], [place] [address] [phone] phone [phone] fax  02/22/18 1:58 PM

## 2018-02-02 LAB — TOXASSURE SELECT 13 (MW), URINE

## 2018-02-16 ENCOUNTER — Telehealth: Payer: Self-pay | Admitting: Family Medicine

## 2018-02-16 DIAGNOSIS — Z13220 Encounter for screening for lipoid disorders: Secondary | ICD-10-CM
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
## 2018-02-16 NOTE — Telephone Encounter (Signed)
She would like an rx for fish oil.

## 2018-02-21 NOTE — Telephone Encounter (Signed)
Please advise 

## 2018-02-22 DIAGNOSIS — Z79890 Hormone replacement therapy (postmenopausal): Secondary | ICD-10-CM | POA: Insufficient documentation

## 2018-02-22 MED ORDER — AMPHETAMINE-DEXTROAMPHETAMINE 5 MG PO TABS: 5.0000 mg | ORAL_TABLET | Freq: Two times a day (BID) | ORAL | 0 refills | Status: DC | PRN

## 2018-02-22 MED ORDER — AMPHETAMINE-DEXTROAMPHET ER 20 MG PO CP24: 20.0000 mg | ORAL_CAPSULE | Freq: Every day | ORAL | 0 refills | Status: DC

## 2018-02-22 NOTE — Telephone Encounter (Signed)
Sent in adderall rxs - try to only bring what she needs with her that day and keep pill in her pocket.  Does she want a rx for otc fish oil so she can use her HSA or whatever or does she want a rx fish oil like Vascepa?  She hasn't had a lipid panel since 06/2016 so rec she come fasting to next visit (ok to come in early for labs or even sev d before so we have the results to discuss - have entered orders - then we can discuss the risks/benefits/type of fish oil at next visit unless she already knows exactly what she wants.

## 2018-02-22 NOTE — Telephone Encounter (Signed)
LVM advising pt to call back.

## 2018-03-07 ENCOUNTER — Ambulatory Visit: Payer: Self-pay
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
## 2018-03-08 ENCOUNTER — Encounter: Payer: Self-pay | Admitting: Family Medicine

## 2018-03-08 ENCOUNTER — Other Ambulatory Visit: Payer: Self-pay

## 2018-03-08 ENCOUNTER — Ambulatory Visit (INDEPENDENT_AMBULATORY_CARE_PROVIDER_SITE_OTHER): Payer: 59 | Admitting: Family Medicine

## 2018-03-08 VITALS — BP 152/70 | HR 100 | Temp 98.2°F | Ht 68.43 in | Wt 182.0 lb

## 2018-03-08 DIAGNOSIS — W503XXA Accidental bite by another person, initial encounter: Secondary | ICD-10-CM

## 2018-03-08 DIAGNOSIS — F900 Attention-deficit hyperactivity disorder, predominantly inattentive type: Secondary | ICD-10-CM

## 2018-03-08 DIAGNOSIS — F341 Dysthymic disorder: Secondary | ICD-10-CM

## 2018-03-08 MED ORDER — DULOXETINE HCL 30 MG PO CPEP: 90.0000 mg | ORAL_CAPSULE | Freq: Every day | ORAL | 0 refills | Status: DC

## 2018-03-08 MED ORDER — AMPHETAMINE-DEXTROAMPHETAMINE 5 MG PO TABS: 5.0000 mg | ORAL_TABLET | Freq: Three times a day (TID) | ORAL | 0 refills | Status: DC
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
## 2018-03-08 NOTE — Patient Instructions (Signed)
     IF you received an x-ray today, you will receive an invoice from [facility]. Please contact [facility] at [phone] with questions or concerns regarding your invoice.   IF you received labwork today, you will receive an invoice from [facility]. Please contact [facility] at [phone] with questions or concerns regarding your invoice.   Our billing staff will not be able to assist you with questions regarding bills from these companies.  You will be contacted with the lab results as soon as they are available. The fastest way to get your results is to activate your My Chart account. Instructions are located on the last page of this paperwork. If you have not heard from us regarding the results in 2 weeks, please contact this office.     

## 2018-03-22 ENCOUNTER — Ambulatory Visit (INDEPENDENT_AMBULATORY_CARE_PROVIDER_SITE_OTHER): Payer: 59 | Admitting: Family Medicine

## 2018-03-22 ENCOUNTER — Encounter: Payer: Self-pay | Admitting: Family Medicine

## 2018-03-22 ENCOUNTER — Other Ambulatory Visit: Payer: Self-pay

## 2018-03-22 VITALS — BP 129/84 | HR 80 | Temp 98.8°F | Resp 16 | Ht 68.43 in | Wt 183.8 lb

## 2018-03-22 DIAGNOSIS — F341 Dysthymic disorder: Secondary | ICD-10-CM | POA: Diagnosis not present

## 2018-03-22 DIAGNOSIS — F900 Attention-deficit hyperactivity disorder, predominantly inattentive type: Secondary | ICD-10-CM | POA: Diagnosis not present

## 2018-03-22 DIAGNOSIS — Z79890 Hormone replacement therapy (postmenopausal): Secondary | ICD-10-CM | POA: Diagnosis not present

## 2018-03-22 DIAGNOSIS — F332 Major depressive disorder, recurrent severe without psychotic features: Secondary | ICD-10-CM

## 2018-03-22 MED ORDER — ESTRADIOL 0.52 MG/0.87 GM (0.06%) TD GEL: 1.0000 "application " | Freq: Every day | TRANSDERMAL | 1 refills | Status: DC

## 2018-03-22 MED ORDER — AMPHETAMINE-DEXTROAMPHET ER 20 MG PO CP24: 20.0000 mg | ORAL_CAPSULE | Freq: Every day | ORAL | 0 refills | Status: DC

## 2018-03-22 MED ORDER — DULOXETINE HCL 30 MG PO CPEP: 90.0000 mg | ORAL_CAPSULE | Freq: Every day | ORAL | 0 refills | Status: DC

## 2018-03-22 MED ORDER — AMPHETAMINE-DEXTROAMPHETAMINE 10 MG PO TABS: 10.0000 mg | ORAL_TABLET | Freq: Two times a day (BID) | ORAL | 0 refills | Status: DC | PRN
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
## 2018-03-22 NOTE — Patient Instructions (Addendum)
IF you received an x-ray today, you will receive an invoice from [facility]. Please contact [facility] at [phone] with questions or concerns regarding your invoice.   IF you received labwork today, you will receive an invoice from [facility]. Please contact [facility] at [phone] with questions or concerns regarding your invoice.   Our billing staff will not be able to assist you with questions regarding bills from these companies.  You will be contacted with the lab results as soon as they are available. The fastest way to get your results is to activate your My Chart account. Instructions are located on the last page of this paperwork. If you have not heard from [place] regarding the results in 2 weeks, please contact this office.     Coping With Loss, Adult People experience loss in many different ways throughout their lives. Events such as moving, changing jobs, and losing friends can create a sense of loss. The loss may be as serious as a major health change, divorce, death of a pet, or death of a loved one. All of these types of loss are likely to create a physical and emotional reaction known as grief. Grief is the result of a major change or an absence of something or someone that you count on. Grief is a normal reaction to loss. How to recognize changes A variety of factors can affect your grieving experience, including:  The nature of your loss.  Your relationship to what or whom you lost.  Your understanding of grief and how to cope with it.  Your support system.  The way that you deal with your grief will affect your ability to function as you normally do. When you are grieving, you may experience:  Numbness, shock, sadness, anxiety, anger, denial, and guilt.  Thoughts about death.  Unexpected crying.  A physical sensation of emptiness in your gut.  Problems sleeping and eating.  Fatigue.  Loss of interest in normal activities.  Dreaming  about or imagining seeing the person who died.  A need to remember what or whom you lost.  Difficulty thinking about anything other than your loss for a period of time.  Relief. If you have been expecting the loss for a while, you may feel a sense of relief when it happens.  Where to find support To get support for coping with loss:  Ask your health care provider for help and recommendations, such as grief counseling or therapy.  Think about joining a support group for people who are coping with loss.  Follow these instructions at home:  Be patient with yourself and others. Allow the grieving process to happen, and remember that grieving takes time. ? It is likely that you may never feel completely done with some grief. You may find a way to move on while still cherishing memories and feelings about your loss. ? Accepting your loss is a process. It can take months or longer to adjust.  Express your feelings in healthy ways, such as: ? Talking with others about your loss. It may be helpful to find others who have had a similar loss, such as a support group. ? Writing down your feelings in a journal. ? Doing physical activities to release stress and emotional energy. ? Doing creative activities like painting, sculpting, or playing or listening to music. ? Practicing resilience. This is the ability to recover and adjust after facing challenges. Reading some resources that encourage resilience may help you to learn ways  to practice those behaviors.  Keep to your normal routine as much as possible. If you have trouble focusing or doing normal activities, it is acceptable to take some time away from your normal routine.  Spend time with friends and loved ones.  Eat a healthy diet, get plenty of sleep, and rest when you feel tired. Where to find more information: You can find more information about coping with loss from:  American Society of Clinical Oncology: www.cancer.net  American  Psychological Association: TVStereos.ch  Contact a health care provider if:  Your grief is extreme and keeps getting worse.  You have ongoing grief that does not improve.  Your body shows symptoms of grief, such as illness.  You feel depressed, anxious, or lonely. Get help right away if:  You have thoughts about hurting yourself or others. If you ever feel like you may hurt yourself or others, or have thoughts about taking your own life, get help right away. You can go to your nearest emergency department or call:  Your local emergency services (911 in the [place]  A suicide crisis helpline, such as the [place] at (747)662-3338. This is open 24 hours a day.  Summary  Grief is a normal part of experiencing a loss. It is the result of a major change or an absence of something or someone that you count on.  The depth of grief and the period of recovery depend on the type of loss as well as your ability to adjust to the change and process your feelings.  Processing grief requires patience and a willingness to accept your feelings and talk about your loss with people who are supportive.  It is important to find resources that work for you and to realize that we are all different when it comes to grief. There is not one single grieving process that works for everyone in the same way.  Be aware that when grief becomes extreme, it can lead to more severe issues like isolation, depression, anxiety, or suicidal thoughts. Talk with your health care provider if you have any of these issues. This information is not intended to replace advice given to you by your health care provider. Make sure you discuss any questions you have with your health care provider. Document Released: 01/21/2017 Document Revised: 01/21/2017 Document Reviewed: 01/21/2017 Elsevier Interactive Patient Education  2018 [place] Stress Reduction Mindfulness-based  stress reduction (MBSR) is a program that helps people learn to practice mindfulness. Mindfulness is the practice of intentionally paying attention to the present moment. It can be learned and practiced through techniques such as education, breathing exercises, meditation, and yoga. MBSR includes several mindfulness techniques in one program. MBSR works best when you understand the treatment, are willing to try new things, and can commit to spending time practicing what you learn. MBSR training may include learning about:  How your emotions, thoughts, and reactions affect your body.  New ways to respond to things that cause negative thoughts to start (triggers).  How to notice your thoughts and let go of them.  Practicing awareness of everyday things that you normally do without thinking.  The techniques and goals of different types of meditation.  What are the benefits of MBSR? MBSR can have many benefits, which include helping you to:  Develop self-awareness. This refers to knowing and understanding yourself.  Learn skills and attitudes that help you to participate in your own health care.  Learn new ways to care  for yourself.  Be more accepting about how things are, and let things go.  Be less judgmental and approach things with an open mind.  Be patient with yourself and trust yourself more.  MBSR has also been shown to:  Reduce negative emotions, such as depression and anxiety.  Improve memory and focus.  Change how you sense and approach pain.  Boost your body's ability to fight infections.  Help you connect better with other people.  Improve your sense of well-being.  Follow these instructions at home:  Find a local in-person or online MBSR program.  Set aside some time regularly for mindfulness practice.  Find a mindfulness practice that works best for you. This may include one or more of the following: ? Meditation. Meditation involves focusing your mind on a  certain thought or activity. ? Breathing awareness exercises. These help you to stay present by focusing on your breath. ? Body scan. For this practice, you lie down and pay attention to each part of your body from head to toe. You can identify tension and soreness and intentionally relax parts of your body. ? Yoga. Yoga involves stretching and breathing, and it can improve your ability to move and be flexible. It can also provide an experience of testing your body's limits, which can help you release stress. ? Mindful eating. This way of eating involves focusing on the taste, texture, color, and smell of each bite of food. Because this slows down eating and helps you feel full sooner, it can be an important part of a weight-loss plan.  Find a podcast or recording that provides guidance for breathing awareness, body scan, or meditation exercises. You can listen to these any time when you have a free moment to rest without distractions.  Follow your treatment plan as told by your health care provider. This may include taking regular medicines and making changes to your diet or lifestyle as recommended. How to practice mindfulness To do a basic awareness exercise:  Find a comfortable place to sit.  Pay attention to the present moment. Observe your thoughts, feelings, and surroundings just as they are.  Avoid placing judgment on yourself, your feelings, or your surroundings. Make note of any judgment that comes up, and let it go.  Your mind may wander, and that is okay. Make note of when your thoughts drift, and return your attention to the present moment.  To do basic mindfulness meditation:  Find a comfortable place to sit. This may include a stable chair or a firm floor cushion. ? Sit upright with your back straight. Let your arms fall next to your side with your hands resting on your legs. ? If sitting in a chair, rest your feet flat on the floor. ? If sitting on a cushion, cross your legs  in front of you.  Keep your head in a neutral position with your chin dropped slightly. Relax your jaw and rest the tip of your tongue on the roof of your mouth. Drop your gaze to the floor. You can close your eyes if you like.  Breathe normally and pay attention to your breath. Feel the air moving in and out of your nose. Feel your belly expanding and relaxing with each breath.  Your mind may wander, and that is okay. Make note of when your thoughts drift, and return your attention to your breath.  Avoid placing judgment on yourself, your feelings, or your surroundings. Make note of any judgment or feelings that come up,  let them go, and bring your attention back to your breath.  When you are ready, lift your gaze or open your eyes. Pay attention to how your body feels after the meditation.  Where to find more information: You can find more information about MBSR from:  Your health care provider.  Community-based meditation centers or programs.  Programs offered near you.  Summary  Mindfulness-based stress reduction (MBSR) is a program that teaches you how to intentionally pay attention to the present moment. It is used with other treatments to help you cope better with daily stress, emotions, and pain.  MBSR focuses on developing self-awareness, which allows you to respond to life stress without judgment or negative emotions.  MBSR programs may involve learning different mindfulness practices, such as breathing exercises, meditation, yoga, body scan, or mindful eating. Find a mindfulness practice that works best for you, and set aside time for it on a regular basis. This information is not intended to replace advice given to you by your health care provider. Make sure you discuss any questions you have with your health care provider. Document Released: 01/14/2017 Document Revised: 01/14/2017 Document Reviewed: 01/14/2017 Elsevier Interactive Patient Education  [employer].

## 2018-04-15 ENCOUNTER — Telehealth: Payer: Self-pay | Admitting: Family Medicine

## 2018-04-15 NOTE — Telephone Encounter (Signed)
Copied from [place] [id number]. Topic: Quick Communication - Rx Refill/Question >> Apr 15, 2018  4:41 PM Bea Graff, NT wrote: Medication: amphetamine-dextroamphetamine (ADDERALL XR) 20 MG 24 hr capsule and amphetamine-dextroamphetamine (ADDERALL) 10 MG tablet  Has the patient contacted their pharmacy? Yes.   (Agent: If no, request that the patient contact the pharmacy for the refill.) (Agent: If yes, when and what did the pharmacy advise?)  Preferred Pharmacy (with phone number or street name): [facility] [phone] - [place], [place] - [address] AT [address] [phone] (Phone) [phone] (Fax)      Agent: Please be advised that RX refills may take up to 3 business days. We ask that you follow-up with your pharmacy.

## 2018-04-16 ENCOUNTER — Ambulatory Visit: Payer: 59 | Admitting: Physician Assistant

## 2018-04-20 ENCOUNTER — Telehealth: Payer: Self-pay | Admitting: Family Medicine

## 2018-04-20 NOTE — Telephone Encounter (Signed)
Patient checking status, call back [phone]

## 2018-04-20 NOTE — Telephone Encounter (Signed)
pls advise

## 2018-04-20 NOTE — Telephone Encounter (Unsigned)
Copied from [place] [phone]. Topic: Quick Communication - Rx Refill/Question >> Apr 20, 2018  2:30 PM Judyann Munson wrote: Medication: amphetamine-dextroamphetamine (ADDERALL XR) 20 MG 24 hr capsule  Has the patient contacted their pharmacy?yes   Preferred Pharmacy (with phone number or street name): [facility] [phone] - [place], [place] [address] AT [address] [phone] (Phone) [phone] (Fax)      Agent: Please be advised that RX refills may take up to 3 business days. We ask that you follow-up with your pharmacy.

## 2018-04-21 ENCOUNTER — Other Ambulatory Visit: Payer: Self-pay | Admitting: Family Medicine

## 2018-04-22 ENCOUNTER — Telehealth: Payer: Self-pay | Admitting: Family Medicine

## 2018-04-22 NOTE — Telephone Encounter (Signed)
Copied from [place] [phone]. Topic: Quick Communication - Rx Refill/Question >> Apr 20, 2018  2:30 PM Judyann Munson wrote: Medication: amphetamine-dextroamphetamine (ADDERALL XR) 20 MG 24 hr capsule  Has the patient contacted their pharmacy?yes   Preferred Pharmacy (with phone number or street name): [facility] [phone] - [place], [place] - [address] AT [address] [phone] (Phone) [phone] (Fax)      Agent: Please be advised that RX refills may take up to 3 business days. We ask that you follow-up with your pharmacy. >> Apr 22, 2018 12:35 PM Keene Breath wrote: Patient called again to check the status of her medication Adderall.  Would like to know if there is another doctor that could sign off on the Rx since Dr. Brigitte Pulse is not in the office.  Patient states that she really needs medication as soon as possible.  CB# [phone].

## 2018-04-23 ENCOUNTER — Other Ambulatory Visit: Payer: Self-pay | Admitting: Family Medicine

## 2018-04-25 NOTE — Telephone Encounter (Signed)
Patient is requesting a refill of the following medications: Requested Prescriptions   Pending Prescriptions Disp Refills  . traZODone (DESYREL) 50 MG tablet [Pharmacy Med Name: TRAZODONE 50MG  TABLETS] 180 tablet 0    Sig: TAKE 1 TO 2 TABLETS BY MOUTH AT BEDTIME AS NEEDED FOR SLEEP    Date of patient request: 04/23/18 Last office visit: 01/28/18 Date of last refill: 01/28/18 Last refill amount: #180 Follow up time period per chart: n/a  Please advise. Dgaddy, CMA

## 2018-04-25 NOTE — Telephone Encounter (Signed)
LOV 03/22/18 Dr. Brigitte Pulse Last refill 01/28/18 # 180 with 0 refill

## 2018-04-26 NOTE — Telephone Encounter (Addendum)
Relation to pt: self  Call back number: [facility] [place], [place] - [place] [phone] (Phone) [phone] (Fax)     Reason for call:  Patient 4x checking on the status of amphetamine-dextroamphetamine (ADDERALL XR) 20 MG 24 hr capsule refill request, patient doesn't have transportation and would like to speak with nurse today stating she's completely out, please advise

## 2018-04-27 MED ORDER — AMPHETAMINE-DEXTROAMPHET ER 20 MG PO CP24: 20.0000 mg | ORAL_CAPSULE | Freq: Every day | ORAL | 0 refills | Status: DC

## 2018-04-27 MED ORDER — AMPHETAMINE-DEXTROAMPHETAMINE 10 MG PO TABS: 10.0000 mg | ORAL_TABLET | Freq: Two times a day (BID) | ORAL | 0 refills | Status: DC | PRN

## 2018-04-27 NOTE — Telephone Encounter (Signed)
Patient called and states that she needs the amphetamine-dextroamphetamine (ADDERALL) 10 MG tablet

## 2018-04-27 NOTE — Telephone Encounter (Signed)
Refilled the 20XR and 10 x 1 mo. Will send in 1 additional month.  Follow-up in early Oct for additional refills.

## 2018-05-21 ENCOUNTER — Other Ambulatory Visit: Payer: Self-pay | Admitting: Family Medicine

## 2018-05-21 ENCOUNTER — Ambulatory Visit: Payer: 59 | Admitting: Family Medicine

## 2018-05-21 NOTE — Telephone Encounter (Signed)
Pt was late for her appt today and is needing a refill on her adderall tablets and time release.  I have her rescheduled for 06/02/18 at 11:40 with Brigitte Pulse. Refill can be sent in to [facility] [address]. Thank you

## 2018-05-24 NOTE — Telephone Encounter (Signed)
Medication:  amphetamine-dextroamphetamine (ADDERALL XR) 20 MG 24 hr capsule amphetamine-dextroamphetamine (ADDERALL) 10 MG tablet   Has the patient contacted their pharmacy? Yes - states the refill wasn't sent in (Agent: If no, request that the patient contact the pharmacy for the refill.) (Agent: If yes, when and what did the pharmacy advise?)   Preferred Pharmacy (with phone number or street name): [facility] [phone] - [place], [place] - [address] AT [address] (Phone) [phone] (Fax)

## 2018-05-25 ENCOUNTER — Telehealth: Payer: Self-pay | Admitting: Family Medicine

## 2018-05-25 NOTE — Telephone Encounter (Signed)
Adderall XR 20 mg refill Last Refill: 04/27/18 # 60 Last OV: 03/08/18 PCP: Dr [name] Pharmacy:[facility] [address] [place], [place]  Adderall 10 mg Last Refill: 04/27/18 #30 Last OV: 03/08/18 PCP: Dr [name] Pharmacy:[facility] [address] [place], [place]

## 2018-05-25 NOTE — Telephone Encounter (Signed)
Patient states she called 2 weeks ahead last month for this medication and it took her 2 weeks to get it. She states this medication was supposed to be sent on Saturday 05/21/18 when she spoke to [place]. Patient is very frustrated and would like a clinical member to give her a call today in regards to this. Please contact patient.  [phone]

## 2018-05-25 NOTE — Telephone Encounter (Signed)
Copied from [place] [phone]. Topic: Quick Communication - Rx Refill/Question >> May 25, 2018 12:22 PM Neva Seat wrote:  amphetamine-dextroamphetamine (ADDERALL XR) 20 MG 24 hr capsule   amphetamine-dextroamphetamine (ADDERALL) 10 MG tablet  Needing this filled.  [facility] [phone] - [place], [place] - [address] AT [address] [place] [address] Phone: [phone] Fax: [phone]

## 2018-05-25 NOTE — Telephone Encounter (Signed)
L/m that we are returning pt's message.  Requested pt call back to discuss.

## 2018-05-30 ENCOUNTER — Telehealth: Payer: Self-pay | Admitting: Family Medicine

## 2018-05-30 MED ORDER — AMPHETAMINE-DEXTROAMPHET ER 20 MG PO CP24: 20.0000 mg | ORAL_CAPSULE | Freq: Every day | ORAL | 0 refills | Status: DC

## 2018-05-30 MED ORDER — AMPHETAMINE-DEXTROAMPHETAMINE 10 MG PO TABS: 10.0000 mg | ORAL_TABLET | Freq: Two times a day (BID) | ORAL | 0 refills | Status: DC | PRN

## 2018-05-30 NOTE — Telephone Encounter (Signed)
Refill sent in other message

## 2018-05-30 NOTE — Telephone Encounter (Signed)
Sent in today by Dr. Pamella Pert

## 2018-05-30 NOTE — Telephone Encounter (Signed)
ERROR

## 2018-05-30 NOTE — Telephone Encounter (Signed)
pmp reviewed, PCP [facility] meds refilled

## 2018-05-30 NOTE — Addendum Note (Signed)
Addended by: Rutherford Guys on: 05/30/2018 01:45 PM   Modules accepted: Orders

## 2018-05-30 NOTE — Telephone Encounter (Signed)
Pt called in to follow up on refill request.  ° °Please assist further.  °

## 2018-05-31 ENCOUNTER — Telehealth: Payer: Self-pay | Admitting: Family Medicine

## 2018-05-31 ENCOUNTER — Ambulatory Visit: Payer: 59 | Admitting: Family Medicine

## 2018-05-31 NOTE — Telephone Encounter (Signed)
Copied from [place] [phone]. Topic: General - Other >> May 31, 2018  1:11 PM Lennox Solders wrote: Reason for CRM: pt is calling and she left her   adderall xr 20 mg and 10 mg in a taxi cab yesterday. Pt did call the taxi service and they did not see medication. Pt does not want to do a police report. [facility] [address] Please advise

## 2018-06-02 ENCOUNTER — Ambulatory Visit: Payer: 59 | Admitting: Family Medicine

## 2018-06-02 NOTE — Telephone Encounter (Signed)
Please advise patient that she will need to file police report for a new prescription.

## 2018-06-05 ENCOUNTER — Other Ambulatory Visit: Payer: Self-pay | Admitting: Family Medicine

## 2018-06-06 NOTE — Telephone Encounter (Signed)
Patient is requesting a 90 day supply of medications.  Cymbalta refill Last refill:04/22/18 #90/0 refill  Elestrin gel refill Last refill:04/22/18  Last OV:03/22/18; Upcoming 06/08/18 HAL:PFXT Pharmacy: [facility] [place], [place] - [address] AT [place] [phone] (Phone) [phone] (Fax)

## 2018-06-07 ENCOUNTER — Other Ambulatory Visit: Payer: Self-pay | Admitting: Family Medicine

## 2018-06-07 NOTE — Telephone Encounter (Signed)
Please see note below and refill if possible  

## 2018-06-07 NOTE — Telephone Encounter (Signed)
Copied from [place] [id number]. Topic: Quick Communication - See Telephone Encounter >> Jun 07, 2018 12:30 PM Vernona Rieger wrote: CRM for notification. See Telephone encounter for: 06/07/18.  Patient is requesting these to be sent to the pharmacy early, to have on file. She said it always takes so long so to get her meds after she request them  amphetamine-dextroamphetamine (ADDERALL XR) 20 MG 24 hr capsule  amphetamine-dextroamphetamine (ADDERALL) 10 MG tablet  [facility] [place], [place] - [address] [place] [address]

## 2018-06-07 NOTE — Telephone Encounter (Signed)
LOV 03/22/18 Dr. Brigitte Pulse

## 2018-06-08 ENCOUNTER — Ambulatory Visit: Payer: Self-pay | Admitting: Family Medicine

## 2018-06-14 NOTE — Telephone Encounter (Signed)
Patient is requesting a refill of the following medications: Requested Prescriptions   Pending Prescriptions Disp Refills  . DULoxetine (CYMBALTA) 30 MG capsule [Pharmacy Med Name: DULOXETINE DR 30MG  CAPSULES] 180 capsule 0    Sig: TAKE 3 CAPSULES BY MOUTH ONCE DAILY  . ELESTRIN 0.52 MG/0.87 GM (0.06%) GEL [Pharmacy Med Name: ELESTRIN 0.06% GEL PUMP 26GM] 26 g 0    Sig: APPLY 2 PUMPS ONTO 1 ARM AT BEDTIME

## 2018-06-17 MED ORDER — AMPHETAMINE-DEXTROAMPHETAMINE 10 MG PO TABS: 10.0000 mg | ORAL_TABLET | Freq: Two times a day (BID) | ORAL | 0 refills | Status: DC | PRN

## 2018-06-17 MED ORDER — AMPHETAMINE-DEXTROAMPHET ER 20 MG PO CP24: 20.0000 mg | ORAL_CAPSULE | Freq: Every day | ORAL | 0 refills | Status: DC

## 2018-06-17 NOTE — Telephone Encounter (Addendum)
Pt's last OV was 7/2 - she wasrx'd and filled refills of both adderall ER 20 qd #30/mo and IR 10 bid #60/mo on 7/2, 8/7, and 9/9 - please let pt know that in the future , she should expect to need to be seen in the office a minimum of every 3 mos in order to receive refills on a level 2 controlled substance so technically is due for OV before refill.  However, pt's comment is INCREDIBLY valid so will make an exception since now due for refill within the next 12 days and did send 1 time refill to pharm which she can fill on or after 10/8. This allows almost 6 wks during which time she needs to be seen for f/u OV - she has seen Dr. Pamella Pert several times prior so ok to f/u with her for refills (and ok to switch to [place] for PCP if pt prefers) as I doubt I have any openings prior to 11/8 - (and will need to be seen prior to 11/8 if she is using adderall daily without interruption.)  Today I have utilized the [place] Controlled Substance Registry's online query to confirm compliance regarding the patient's controlled medications. My review reveals appropriate prescription fills and that I am the sole provider of these medications. Rechecks will occur regularly and the patient is aware of our use of the system.

## 2018-06-28 ENCOUNTER — Other Ambulatory Visit: Payer: Self-pay

## 2018-06-28 ENCOUNTER — Encounter: Payer: Self-pay | Admitting: Family Medicine

## 2018-06-28 ENCOUNTER — Ambulatory Visit (INDEPENDENT_AMBULATORY_CARE_PROVIDER_SITE_OTHER): Payer: 59 | Admitting: Family Medicine

## 2018-06-28 VITALS — BP 140/85 | HR 82 | Temp 98.5°F | Ht 68.43 in | Wt 184.2 lb

## 2018-06-28 DIAGNOSIS — Z79890 Hormone replacement therapy (postmenopausal): Secondary | ICD-10-CM | POA: Diagnosis not present

## 2018-06-28 DIAGNOSIS — Z5181 Encounter for therapeutic drug level monitoring: Secondary | ICD-10-CM

## 2018-06-28 DIAGNOSIS — F332 Major depressive disorder, recurrent severe without psychotic features: Secondary | ICD-10-CM | POA: Diagnosis not present

## 2018-06-28 DIAGNOSIS — F900 Attention-deficit hyperactivity disorder, predominantly inattentive type: Secondary | ICD-10-CM | POA: Diagnosis not present

## 2018-06-28 MED ORDER — HYDROXYZINE HCL 25 MG PO TABS: ORAL_TABLET | ORAL | 1 refills | Status: DC

## 2018-06-28 MED ORDER — ADDERALL 10 MG PO TABS: 10.0000 mg | ORAL_TABLET | Freq: Two times a day (BID) | ORAL | 0 refills | Status: DC | PRN

## 2018-06-28 MED ORDER — ORACEA 40 MG PO CPDR: 40.0000 mg | DELAYED_RELEASE_CAPSULE | ORAL | 1 refills | Status: DC

## 2018-06-28 MED ORDER — TRAZODONE HCL 50 MG PO TABS: 50.0000 mg | ORAL_TABLET | Freq: Every evening | ORAL | 0 refills | Status: DC | PRN

## 2018-06-28 MED ORDER — ADDERALL XR 20 MG PO CP24: 20.0000 mg | ORAL_CAPSULE | Freq: Every day | ORAL | 0 refills | Status: DC

## 2018-06-28 MED ORDER — ESTRADIOL 0.52 MG/0.87 GM (0.06%) TD GEL: TRANSDERMAL | 2 refills | Status: DC

## 2018-06-28 MED ORDER — DULOXETINE HCL 30 MG PO CPEP: 90.0000 mg | ORAL_CAPSULE | Freq: Every day | ORAL | 0 refills | Status: DC

## 2018-06-28 MED ORDER — TERBINAFINE HCL 250 MG PO TABS: 250.0000 mg | ORAL_TABLET | Freq: Every day | ORAL | 0 refills | Status: DC

## 2018-06-28 NOTE — Patient Instructions (Signed)
° ° ° °  If you have lab work done today you will be contacted with your lab results within the next 2 weeks.  If you have not heard from us then please contact us. The fastest way to get your results is to register for My Chart. ° ° °IF you received an x-ray today, you will receive an invoice from [facility]. Please contact  Radiology at [phone] with questions or concerns regarding your invoice.  ° °IF you received labwork today, you will receive an invoice from [facility]. Please contact [facility] at [phone] with questions or concerns regarding your invoice.  ° °Our billing staff will not be able to assist you with questions regarding bills from these companies. ° °You will be contacted with the lab results as soon as they are available. The fastest way to get your results is to activate your My Chart account. Instructions are located on the last page of this paperwork. If you have not heard from us regarding the results in 2 weeks, please contact this office. °  ° ° ° °

## 2018-06-28 NOTE — Progress Notes (Signed)
10/8/20191:38 PM  Crystal Williamson Feb 05, 1966, 52 y.o. female [id number]  Chief Complaint  Patient presents with  . Medication Refill    adderall, cymbalta, estrace,   . Rash    has an outbreak on the right foot, has had this outbreak before. Requesting something topical    HPI:   Patient is a 52 y.o. female with past medical history significant for ADHD, MDD, HRT who presents today for routine followup  PCP Dr Brigitte Pulse  Overall doing well Has moved to a new place, lives alone  cymbalta at 90mg  dose working well Continues to struggle with finance  Worried about her new insurance not covering higher dose Wanting to start to wean   Would like to increase estrogen to 2 pumps as she is still having hot flashes in early morning, around 4am, wakes her up from sleep  Wondering about getting brand adderall Remembers that brand name used to help with mood and calm down the irritability and helps with anhedonia Takes trazadone and vistaril prn   Office Visit from 06/28/2018 in Primary Care at [facility]  PHQ-9 Total Score  7    GAD 7 = 0  Having right foot rash Having peeling and some yellowing of the great toenail Wearing thigh high compression stockings Used to terbinafine pill in the past wo issues Normal LFT in march 2019  Requesting refill of medication for rosacea Doxy 40mg  daily    Fall Risk  06/28/2018 03/08/2018 01/28/2018 12/24/2017 11/27/2017  Falls in the past year? No No No No No     Depression screen [facility] 2/9 06/28/2018 06/28/2018 03/22/2018  Decreased Interest 1 0 2  Down, Depressed, Hopeless 1 0 2  PHQ - 2 Score 2 0 4  Altered sleeping 1 - 1  Tired, decreased energy 1 - 3  Change in appetite 1 - 0  Feeling bad or failure about yourself  1 - 2  Trouble concentrating 0 - 0  Moving slowly or fidgety/restless 0 - 0  Suicidal thoughts 1 - 0  PHQ-9 Score 7 - 10  Difficult doing work/chores Somewhat difficult - (No Data)  passive SI  Allergies  Allergen Reactions    . Codeine Itching    REACTION: N \\T \ V  . Hydromorphone Itching  . Opium   . Percocet [Oxycodone-Acetaminophen] Itching    Prior to Admission medications   Medication Sig Start Date End Date Taking? Authorizing Provider  amphetamine-dextroamphetamine (ADDERALL XR) 20 MG 24 hr capsule Take 1 capsule (20 mg total) by mouth daily. 06/28/18  Yes [name], MD  amphetamine-dextroamphetamine (ADDERALL) 10 MG tablet Take 1 tablet (10 mg total) by mouth 2 (two) times daily as needed (ADD symptoms). 06/28/18  Yes [name], MD  DULoxetine (CYMBALTA) 30 MG capsule Take 3 capsules (90 mg total) by mouth daily. 06/17/18  Yes [name], MD  ELESTRIN 0.52 MG/0.87 GM (0.06%) GEL APPLY 2 PUMPS ONTO 1 ARM AT BEDTIME 06/17/18  Yes [name], MD  estradiol (ESTRACE) 1 MG tablet Take 1 tablet (1 mg total) by mouth daily. 01/28/18  Yes [name], MD  hydrOXYzine (ATARAX/VISTARIL) 25 MG tablet TAKE 1 TABLET BY MOUTH AT BEDTIME AS NEEDED FOR ANXIETY 01/28/18  Yes [name], MD  traZODone (DESYREL) 50 MG tablet TAKE 1 TO 2 TABLETS BY MOUTH AT BEDTIME AS NEEDED FOR SLEEP 05/03/18  Yes Forrest Moron, MD    Past Medical History:  Diagnosis Date  . Allergy   .  Depression   . Fibroid   . Headache(784.0)   . No pertinent past medical history   . Varicose veins    bilateral    Past Surgical History:  Procedure Laterality Date  . ABDOMINAL HYSTERECTOMY  06/01/2012   Procedure: HYSTERECTOMY ABDOMINAL;  Surgeon: Terrance Mass, MD;  Location: [facility];  Service: Gynecology;  Laterality: N/A;  . SEPTOPLASTY    . VARICOSE VEIN SURGERY    . WISDOM TOOTH EXTRACTION      Social History   Tobacco Use  . Smoking status: Current Some Day Smoker    Packs/day: 0.25    Types: Cigarettes  . Smokeless tobacco: Never Used  . Tobacco comment: 5 cig a day  Substance Use Topics  . Alcohol use: Yes    Alcohol/week: 0.0 standard drinks    Comment: 3-4x week     Family History  Problem Relation Age of Onset   . Colon cancer Paternal Grandfather   . Diabetes Paternal Grandfather        type 2   . Cancer Paternal Grandfather        COLON  . Breast cancer Paternal Grandmother   . Stroke Maternal Grandmother   . Lupus Mother   . Lumbar disc disease Mother   . Varicose Veins Mother   . Cancer Maternal Grandfather   . Diabetes Maternal Grandfather     ROS Per hpi  OBJECTIVE:  Blood pressure 140/85, pulse 82, temperature 98.5 F (36.9 C), temperature source Oral, height 5' 8.43" (1.738 m), weight 184 lb 3.2 oz (83.6 kg), last menstrual period 02/08/2012, SpO2 99 %. Body mass index is 27.66 kg/m.   Physical Exam  Constitutional: She is oriented to person, place, and time. She appears well-developed and well-nourished.  HENT:  Head: Normocephalic and atraumatic.  Mouth/Throat: Mucous membranes are normal.  Eyes: Pupils are equal, round, and reactive to light. Conjunctivae and EOM are normal. No scleral icterus.  Neck: Neck supple.  Pulmonary/Chest: Effort normal.  Neurological: She is alert and oriented to person, place, and time.  Skin: Skin is warm and dry.  Psychiatric: She has a normal mood and affect.  Nursing note and vitals reviewed.    ASSESSMENT and PLAN  1. Attention deficit hyperactivity disorder (ADHD), predominantly inattentive type pmp reviewed. Tried brand name, not covered, will cont generic - ToxASSURE Select 13 (MW), Urine  2. MDD (major depressive disorder), recurrent severe, without psychosis ([place]) Controlled. Discussed trial of weaning to 60mg  due to concerns about future coverage  3. Postmenopausal hormone replacement therapy Not well controlled. Increase to 2pumps a day  4. Medication monitoring encounter - ToxASSURE Select 13 (MW), Urine  Provided med for onychomycosis and rosacea. Discussed r/se/b.   Other orders - ADDERALL 10 MG tablet; Take 1 tablet (10 mg total) by mouth 2 (two) times daily as needed (ADD symptoms). - ADDERALL XR 20 MG 24 hr  capsule; Take 1 capsule (20 mg total) by mouth daily. - DULoxetine (CYMBALTA) 30 MG capsule; Take 3 capsules (90 mg total) by mouth daily. - Estradiol (ELESTRIN) 0.52 MG/0.87 GM (0.06%) GEL; APPLY 2 PUMPS ONTO 1 ARM AT BEDTIME - hydrOXYzine (ATARAX/VISTARIL) 25 MG tablet; TAKE 1 TABLET BY MOUTH AT BEDTIME AS NEEDED FOR ANXIETY - traZODone (DESYREL) 50 MG tablet; Take 1-2 tablets (50-100 mg total) by mouth at bedtime as needed. - terbinafine (LAMISIL) 250 MG tablet; Take 1 tablet (250 mg total) by mouth daily. - ORACEA 40 MG capsule; Take 1 capsule (40 mg total)  by mouth every morning.  Return in about 3 months (around 09/28/2018) for Dr Brigitte Pulse, chronic conditions.    Rutherford Guys, MD Primary Care at [place],  [address] Ph.  [phone] Fax [phone]

## 2018-07-03 LAB — TOXASSURE SELECT 13 (MW), URINE

## 2018-08-16 ENCOUNTER — Other Ambulatory Visit: Payer: Self-pay | Admitting: Family Medicine

## 2018-08-16 NOTE — Telephone Encounter (Signed)
Copied from [place] [id number]. Topic: Quick Communication - Rx Refill/Question >> Aug 16, 2018  4:02 PM [place], [place] D wrote: Medication: ADDERALL 10 MG tablet / ADDERALL XR 20 MG 24 hr capsule / Pharmacy told the pt she needs to call PCP for refill.  Has the patient contacted their pharmacy? Yes.   (Agent: If no, request that the patient contact the pharmacy for the refill.) (Agent: If yes, when and what did the pharmacy advise?)  Preferred Pharmacy (with phone number or street name): [phone] - [place], [place] - [address] AT [address] [phone] (Phone) [phone] (Fax)    Agent: Please be advised that RX refills may take up to 3 business days. We ask that you follow-up with your pharmacy.

## 2018-08-22 MED ORDER — ADDERALL XR 20 MG PO CP24: 20.0000 mg | ORAL_CAPSULE | Freq: Every day | ORAL | 0 refills | Status: DC

## 2018-08-22 MED ORDER — ADDERALL 10 MG PO TABS: 10.0000 mg | ORAL_TABLET | Freq: Two times a day (BID) | ORAL | 0 refills | Status: DC | PRN

## 2018-08-22 NOTE — Telephone Encounter (Signed)
NEEDS OFFICE VISIT FOR ANY ADDITIONAL REFILLS. [facility] INFORMED PT.

## 2018-08-22 NOTE — Telephone Encounter (Signed)
Informed pt that Rx has been refilled x 1 month but will need and OV for Further refills.

## 2018-09-16 ENCOUNTER — Other Ambulatory Visit: Payer: Self-pay | Admitting: Family Medicine

## 2018-09-16 NOTE — Telephone Encounter (Addendum)
Copied from [place] [id number]. Topic: General - Other >> Sep 16, 2018  2:13 PM Lennox Solders wrote: Reason for CRM: pt need refill on generic adderall 10 and xr 20 mg. Pt would like rxs to be refill on dec 29-19. Pt also needs generic lamisil  [facility] [address]. Pt has an appt on 10-12-18 with dr Brigitte Pulse

## 2018-09-20 NOTE — Telephone Encounter (Signed)
Patient calling back to check status of refill request. Please advise.

## 2018-09-23 NOTE — Telephone Encounter (Signed)
Requested medication (s) are due for refill today: yes for all three  Requested medication (s) are on the active medication list: yes for all three  Last refill:  adderall XR 20 mg LR  08/22/18 for 30 tabs, adderall 10 mg LR  08/22/18 for 60 tabs and terbinafine 250 mg  LR 06/28/18 for 90 tabs.   Future visit scheduled: yes  Notes to clinic:  adderall and adderall XR can not be delegated and terbinafine does not have a protocol.  Requested Prescriptions  Pending Prescriptions Disp Refills   ADDERALL XR 20 MG 24 hr capsule 30 capsule 0    Sig: Take 1 capsule (20 mg total) by mouth daily.     Not Delegated - Psychiatry:  Stimulants/ADHD Failed - 09/23/2018  4:08 PM      Failed - This refill cannot be delegated      Passed - Urine Drug Screen completed in last 360 days.      Passed - Valid encounter within last 3 months    Recent Outpatient Visits          2 months ago Attention deficit hyperactivity disorder (ADHD), predominantly inattentive type   [facility], Lilia Argue, MD   6 months ago Attention deficit hyperactivity disorder (ADHD), predominantly inattentive type   [facility], Laurey Arrow, MD   6 months ago Persistent depressive disorder with anxious distress, currently severe   [facility], Lilia Argue, MD   7 months ago Postmenopausal hormone replacement therapy   [facility], Laurey Arrow, MD   9 months ago Severe episode of recurrent major depressive disorder, without psychotic features [facility])   [facility], Lilia Argue, MD      Future Appointments            In 2 weeks [name], MD Primary Care at [place], [facility]          ADDERALL 10 MG tablet 60 tablet 0    Sig: Take 1 tablet (10 mg total) by mouth 2 (two) times daily as needed (ADD symptoms).     Not Delegated - Psychiatry:  Stimulants/ADHD Failed - 09/23/2018  4:08 PM      Failed - This refill cannot be delegated      Passed - Urine Drug Screen  completed in last 360 days.      Passed - Valid encounter within last 3 months    Recent Outpatient Visits          2 months ago Attention deficit hyperactivity disorder (ADHD), predominantly inattentive type   [facility], Lilia Argue, MD   6 months ago Attention deficit hyperactivity disorder (ADHD), predominantly inattentive type   [facility], Laurey Arrow, MD   6 months ago Persistent depressive disorder with anxious distress, currently severe   [facility], Lilia Argue, MD   7 months ago Postmenopausal hormone replacement therapy   [facility], Laurey Arrow, MD   9 months ago Severe episode of recurrent major depressive disorder, without psychotic features [facility])   [facility], Lilia Argue, MD      Future Appointments            In 2 weeks [name], MD Primary Care at [facility] [place], [facility]          terbinafine (LAMISIL) 250 MG tablet 90 tablet 0  Sig: Take 1 tablet (250 mg total) by mouth daily.     Off-Protocol Failed - 09/23/2018  4:08 PM      Failed - Medication not assigned to a protocol, review manually.      Passed - Valid encounter within last 12 months    Recent Outpatient Visits          2 months ago Attention deficit hyperactivity disorder (ADHD), predominantly inattentive type   [facility], Lilia Argue, MD   6 months ago Attention deficit hyperactivity disorder (ADHD), predominantly inattentive type   [facility], Laurey Arrow, MD   6 months ago Persistent depressive disorder with anxious distress, currently severe   [facility], Lilia Argue, MD   7 months ago Postmenopausal hormone replacement therapy   [facility], Laurey Arrow, MD   9 months ago Severe episode of recurrent major depressive disorder, without psychotic features [facility])   [facility], Lilia Argue, MD      Future Appointments            In 2 weeks [name], MD Primary  Care at [place], [facility]

## 2018-09-23 NOTE — Telephone Encounter (Signed)
Pt is calling again to check on status of refill request. Original request put in on 09/16/18. Please advise. She has been out of medication for 3 days.

## 2018-09-24 MED ORDER — ADDERALL 10 MG PO TABS: 10.0000 mg | ORAL_TABLET | Freq: Two times a day (BID) | ORAL | 0 refills | Status: DC | PRN

## 2018-09-24 MED ORDER — TERBINAFINE HCL 250 MG PO TABS: 250.0000 mg | ORAL_TABLET | Freq: Every day | ORAL | 0 refills | Status: DC

## 2018-09-24 MED ORDER — ADDERALL XR 20 MG PO CP24: 20.0000 mg | ORAL_CAPSULE | Freq: Every day | ORAL | 0 refills | Status: DC

## 2018-09-24 NOTE — Telephone Encounter (Signed)
Pt should have had enough medication to last her until 10/6 - she has gotten the last several rx sev days early so should have enough from the extra days when she had filled adderall refills on 10/8 and then on 11/5 and then 12/2 to last her until 1/4.  Will send rxs in sev d early

## 2018-09-28 ENCOUNTER — Telehealth: Payer: Self-pay | Admitting: Family Medicine

## 2018-09-28 NOTE — Telephone Encounter (Signed)
MyChart message sent to pt about their appointment on 10/12/18 with Dr Brigitte Pulse

## 2018-10-12 ENCOUNTER — Ambulatory Visit: Payer: 59 | Admitting: Family Medicine

## 2018-10-12 ENCOUNTER — Other Ambulatory Visit: Payer: Self-pay | Admitting: Family Medicine

## 2018-10-12 NOTE — Telephone Encounter (Signed)
Copied from [place]. Topic: Quick Communication - Rx Refill/Question >> Oct 12, 2018  1:24 PM Margot Ables wrote: Medication: ADDERALL 10 MG tablet  & ADDERALL XR 20 MG 24 hr capsule - please send for brand OR generic so pt pharmacy doesn't have to call for clarification again - pt is going to be out in 1 wk. She was supposed to have appt 1/22 with Dr. Brigitte Pulse but she is on leave - pt is scheduled for 2/18 with Dr. Mitchel Honour which Dr. Brigitte Pulse is out   Has the patient contacted their pharmacy? yes Preferred Pharmacy (with phone number or street name): [facility] [phone] - [place], [place] - [address] [place] [address] Phone: [phone] Fax: [phone]

## 2018-10-12 NOTE — Telephone Encounter (Signed)
Please advise 

## 2018-10-12 NOTE — Telephone Encounter (Signed)
Please review for refills. Pt requesting generic brand of adderall xr 20 mg and adderall 10 mg. Dr. Brigitte Pulse NOV with Dr. Mitchel Honour

## 2018-10-19 NOTE — Telephone Encounter (Addendum)
Relation to pt: self  Call back number: [phone] Pharmacy:  [facility] [place], [address]  [address] [place] [address]  Phone: [phone] Fax: [phone]  Not a 24 hour pharmacy; exact hours not known.     Reason for call:  Patient checking on the status of ADDERALL 10 MG tablet and ADDERALL XR 20 MG 24 hr capsule, patient states she's due for a refill in 2 days and would like to ensure there's no delay

## 2018-10-20 ENCOUNTER — Other Ambulatory Visit: Payer: Self-pay | Admitting: Family Medicine

## 2018-10-20 NOTE — Telephone Encounter (Signed)
pls see note. thanks 

## 2018-10-21 NOTE — Telephone Encounter (Signed)
Please send to Dr. Pamella Pert.  I have never seen this patient before.  Thanks.

## 2018-10-22 NOTE — Telephone Encounter (Signed)
pls see note 

## 2018-10-23 MED ORDER — AMPHETAMINE-DEXTROAMPHET ER 20 MG PO CP24: 20.0000 mg | ORAL_CAPSULE | Freq: Every day | ORAL | 0 refills | Status: DC

## 2018-10-23 MED ORDER — AMPHETAMINE-DEXTROAMPHETAMINE 10 MG PO TABS: 10.0000 mg | ORAL_TABLET | Freq: Two times a day (BID) | ORAL | 0 refills | Status: DC | PRN

## 2018-10-23 NOTE — Telephone Encounter (Signed)
Has upcoming appt Feb 18th UDS done in oct appropriate It seems that adderall might need prior auth

## 2018-10-23 NOTE — Telephone Encounter (Signed)
pmp reviewed PCP Dr Brigitte Pulse Last refill jan 4th 2020 Med refilled

## 2018-11-08 ENCOUNTER — Ambulatory Visit: Payer: 59 | Admitting: Emergency Medicine

## 2018-11-08 ENCOUNTER — Encounter: Payer: Self-pay | Admitting: Emergency Medicine

## 2018-11-08 VITALS — BP 146/86 | HR 83 | Temp 97.6°F | Resp 17 | Ht 68.0 in | Wt 186.0 lb

## 2018-11-08 DIAGNOSIS — F900 Attention-deficit hyperactivity disorder, predominantly inattentive type: Secondary | ICD-10-CM | POA: Diagnosis not present

## 2018-11-08 DIAGNOSIS — F332 Major depressive disorder, recurrent severe without psychotic features: Secondary | ICD-10-CM | POA: Diagnosis not present

## 2018-11-08 MED ORDER — AMPHETAMINE-DEXTROAMPHET ER 10 MG PO CP24: 20.0000 mg | ORAL_CAPSULE | Freq: Two times a day (BID) | ORAL | 0 refills | Status: DC

## 2018-11-08 MED ORDER — DULOXETINE HCL 30 MG PO CPEP: 90.0000 mg | ORAL_CAPSULE | Freq: Every day | ORAL | 0 refills | Status: DC

## 2018-11-08 NOTE — Progress Notes (Signed)
Crystal Williamson 53 y.o.   Chief Complaint  Patient presents with  . Follow-up    medication change per patient     HISTORY OF PRESENT ILLNESS: This is a 53 y.o. female patient of Dr. Brigitte Pulse with a history of ADHD and depression.  Requesting paper prescription for duloxetine and Adderall.  Wants me to change Adderall to Adderall XR 10 mg to be taken 4 times a day.  Presently she takes a total of 40 mg a day but insurance purposes she is requesting the XR formulation.  HPI   Prior to Admission medications   Medication Sig Start Date End Date Taking? Authorizing Provider  amphetamine-dextroamphetamine (ADDERALL XR) 20 MG 24 hr capsule Take 1 capsule (20 mg total) by mouth daily. Please keep upcoming appt on Feb 18th 10/23/18  Yes Rutherford Guys, MD  amphetamine-dextroamphetamine (ADDERALL) 10 MG tablet Take 1 tablet (10 mg total) by mouth 2 (two) times daily as needed. Please keep upcoming appt on Feb 18th 10/23/18  Yes Rutherford Guys, MD  DULoxetine (CYMBALTA) 30 MG capsule TAKE 3 CAPSULES BY MOUTH DAILY 10/20/18  Yes Rutherford Guys, MD  Estradiol (ELESTRIN) 0.52 MG/0.87 GM (0.06%) GEL APPLY 2 PUMPS ONTO 1 ARM AT BEDTIME 06/28/18  Yes Rutherford Guys, MD  hydrOXYzine (ATARAX/VISTARIL) 25 MG tablet TAKE 1 TABLET BY MOUTH AT BEDTIME AS NEEDED FOR ANXIETY 06/28/18  Yes Rutherford Guys, MD  ORACEA 40 MG capsule Take 1 capsule (40 mg total) by mouth every morning. 06/28/18  Yes Rutherford Guys, MD  terbinafine (LAMISIL) 250 MG tablet Take 1 tablet (250 mg total) by mouth daily. **NEEDS OFFICE VISIT FOR REFILS** 09/24/18  Yes [name], MD  traZODone (DESYREL) 50 MG tablet Take 1-2 tablets (50-100 mg total) by mouth at bedtime as needed. 06/28/18  Yes Rutherford Guys, MD    Allergies  Allergen Reactions  . Codeine Itching    REACTION: N \\T \ V  . Hydromorphone Itching  . Opium   . Percocet [Oxycodone-Acetaminophen] Itching    Patient Active Problem List   Diagnosis Date Noted  .  Postmenopausal hormone replacement therapy 02/22/2018  . MDD (major depressive disorder), recurrent severe, without psychosis ([place]) 11/09/2017  . Persistent depressive disorder with anxious distress, currently severe   . Overdose 11/06/2017  . Alopecia 07/19/2012  . 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 HEADACHE 08/06/2009  . KERATOSIS PILARIS 06/14/2009  . HYPERGLYCEMIA 02/22/2009  . DERMATOPHYTOSIS OF NAIL 02/14/2009  . OBESITY 02/14/2009  . SEBORRHEIC KERATOSIS 02/14/2009  . LOW BACK PAIN, CHRONIC 02/14/2009  . FATIGUE 02/14/2009  . Attention deficit hyperactivity disorder (ADHD) 02/14/2009    Past Medical History:  Diagnosis Date  . Allergy   . Depression   . Fibroid   . Headache(784.0)   . No pertinent past medical history   . Varicose veins    bilateral    Past Surgical History:  Procedure Laterality Date  . ABDOMINAL HYSTERECTOMY  06/01/2012   Procedure: HYSTERECTOMY ABDOMINAL;  Surgeon: Terrance Mass, MD;  Location: [facility];  Service: Gynecology;  Laterality: N/A;  . SEPTOPLASTY    . VARICOSE VEIN SURGERY    . WISDOM TOOTH EXTRACTION      Social History   Socioeconomic History  . Marital status: Widowed    Spouse name: Not on file  . Number of children: Not on file  . Years of education: Not on file  . Highest education level: Not on file  Occupational  History  . Not on file  Social Needs  . Financial resource strain: Not on file  . Food insecurity:    Worry: Not on file    Inability: Not on file  . Transportation needs:    Medical: Not on file    Non-medical: Not on file  Tobacco Use  . Smoking status: Current Some Day Smoker    Packs/day: 0.25    Types: Cigarettes  . Smokeless tobacco: Never Used  . Tobacco comment: 5 cig a day  Substance and Sexual Activity  . Alcohol use: Yes    Alcohol/week: 0.0 standard drinks    Comment: 3-4x week   . Drug use: No  . Sexual activity: Not Currently    Birth control/protection: None, Surgical    Lifestyle  . Physical activity:    Days per week: Not on file    Minutes per session: Not on file  . Stress: Not on file  Relationships  . Social connections:    Talks on phone: Not on file    Gets together: Not on file    Attends religious service: Not on file    Active member of club or organization: Not on file    Attends meetings of clubs or organizations: Not on file    Relationship status: Not on file  . Intimate partner violence:    Fear of current or ex partner: Not on file    Emotionally abused: Not on file    Physically abused: Not on file    Forced sexual activity: Not on file  Other Topics Concern  . Not on file  Social History Narrative   ** Merged History Encounter **        Family History  Problem Relation Age of Onset  . Colon cancer Paternal Grandfather   . Diabetes Paternal Grandfather        type 2   . Cancer Paternal Grandfather        COLON  . Breast cancer Paternal Grandmother   . Stroke Maternal Grandmother   . Lupus Mother   . Lumbar disc disease Mother   . Varicose Veins Mother   . Cancer Maternal Grandfather   . Diabetes Maternal Grandfather      Review of Systems  Constitutional: Negative.  Negative for chills and fever.  Eyes: Negative for blurred vision and double vision.  Respiratory: Negative for cough and shortness of breath.   Cardiovascular: Negative for chest pain and palpitations.  Gastrointestinal: Negative for abdominal pain, nausea and vomiting.  Skin: Negative.  Negative for rash.  Neurological: Negative for dizziness.  All other systems reviewed and are negative.   Vitals:   11/08/18 1350  BP: (!) 146/86  Pulse: 83  Resp: 17  Temp: 97.6 F (36.4 C)  SpO2: 98%    Physical Exam Vitals signs reviewed.  Constitutional:      Appearance: Normal appearance.  HENT:     Head: Normocephalic.  Eyes:     Extraocular Movements: Extraocular movements intact.     Pupils: Pupils are equal, round, and reactive to light.   Neck:     Musculoskeletal: Normal range of motion.  Cardiovascular:     Rate and Rhythm: Normal rate.  Pulmonary:     Effort: Pulmonary effort is normal.  Musculoskeletal: Normal range of motion.  Skin:    General: Skin is warm and dry.  Neurological:     General: No focal deficit present.     Mental Status: She is alert  and oriented to person, place, and time.      ASSESSMENT & PLAN: Crystal Williamson was seen today for follow-up.  Diagnoses and all orders for this visit:  Attention deficit hyperactivity disorder (ADHD), predominantly inattentive type -     amphetamine-dextroamphetamine (ADDERALL XR) 10 MG 24 hr capsule; Take 2 capsules (20 mg total) by mouth 2 (two) times daily for 30 days.  MDD (major depressive disorder), recurrent severe, without psychosis ([place]) -     DULoxetine (CYMBALTA) 30 MG capsule; Take 3 capsules (90 mg total) by mouth daily.    Patient Instructions       If you have lab work done today you will be contacted with your lab results within the next 2 weeks.  If you have not heard from [place] then please contact us. The fastest way to get your results is to register for My Chart.   IF you received an x-ray today, you will receive an invoice from [facility]. Please contact [facility] at [phone] with questions or concerns regarding your invoice.   IF you received labwork today, you will receive an invoice from [facility]. Please contact [facility] at [phone] with questions or concerns regarding your invoice.   Our billing staff will not be able to assist you with questions regarding bills from these companies.  You will be contacted with the lab results as soon as they are available. The fastest way to get your results is to activate your My Chart account. Instructions are located on the last page of this paperwork. If you have not heard from [place] regarding the results in 2 weeks, please contact this office.      Living With Attention  Deficit Hyperactivity Disorder If you have been diagnosed with attention deficit hyperactivity disorder (ADHD), you may be relieved that you now know why you have felt or behaved a certain way. Still, you may feel overwhelmed about the treatment ahead. You may also wonder how to get the support you need and how to deal with the condition day-to-day. With treatment and support, you can live with ADHD and manage your symptoms. How to manage lifestyle changes Managing stress Stress is your body's reaction to life changes and events, both good and bad. To cope with the stress of an ADHD diagnosis, it may help to:  Learn more about ADHD.  Exercise regularly. Even a short daily walk can lower stress levels.  Participate in training or education programs (including social skills training classes) that teach you to deal with symptoms.  Medicines Your health care provider may suggest certain medicines if he or she feels that they will help to improve your condition. Stimulant medicines are usually prescribed to treat ADHD, and therapy may also be prescribed. It is important to:  Avoid using alcohol and other substances that may prevent your medicines from working properly [facility]).  Talk with your pharmacist or health care provider about all the medicines that you take, their possible side effects, and what medicines are safe to take together.  Make it your goal to take part in all treatment decisions (shared decision-making). Ask about possible side effects of medicines that your health care provider recommends, and tell him or her how you feel about having those side effects. It is best if shared decision-making with your health care provider is part of your total treatment plan. Relationships To strengthen your relationships with family members while treating your condition, consider taking part in family therapy. You might also attend self-help groups alone  or with a loved one. Be honest  about how your symptoms affect your relationships. Make an effort to communicate respectfully instead of fighting, and find ways to show others that you care. Psychotherapy may be useful in helping you cope with how ADHD affects your relationships. How to recognize changes in your condition The following signs may mean that your treatment is working well and your condition is improving:  Consistently being on time for appointments.  Being more organized at home and work.  Other people noticing improvements in your behavior.  Achieving goals that you set for yourself.  Thinking more clearly. The following signs may mean that your treatment is not working very well:  Feeling impatience or more confusion.  Missing, forgetting, or being late for appointments.  An increasing sense of disorganization and messiness.  More difficulty in reaching goals that you set for yourself.  Loved ones becoming angry or frustrated with you. Where to find support Talking to others  Keep emotion out of important discussions and speak in a calm, logical way.  Listen closely and patiently to your loved ones. Try to understand their point of view, and try to avoid getting defensive.  Take responsibility for the consequences of your actions.  Ask that others do not take your behaviors personally.  Aim to solve problems as they come up, and express your feelings instead of bottling them up.  Talk openly about what you need from your loved ones and how they can support you.  Consider going to family therapy sessions or having your family meet with a specialist who deals with ADHD-related behavior problems. Finances Not all insurance plans cover mental health care, so it is important to check with your insurance carrier. If paying for co-pays or counseling services is a problem, search for a local or county mental health care center. Public mental health care services may be offered there at a low cost or  no cost when you are not able to see a private health care provider. If you are taking medicine for ADHD, you may be able to get the generic form, which may be less expensive than brand-name medicine. Some makers of prescription medicines also offer help to patients who cannot afford the medicines that they need. Follow these instructions at home:  Take over-the-counter and prescription medicines only as told by your health care provider. Check with your health care provider before taking any new medicines.  Create structure and an organized atmosphere at home. For example: ? Make a list of tasks, then rank them from most important to least important. Work on one task at a time until your listed tasks are done. ? Make a daily schedule and follow it consistently every day. ? Use an appointment calendar, and check it 2 or 3 times a day to keep on track. Keep it with you when you leave the house. ? Create spaces where you keep certain things, and always put things back in their places after you use them.  Keep all follow-up visits as told by your health care provider. This is important. Questions to ask your health care provider:  What are the risks and benefits of taking medicines?  Would I benefit from therapy?  How often should I follow up with a health care provider? Contact a health care provider if:  You have side effects from your medicines, such as: ? Repeated muscle twitches, coughing, or speech outbursts. ? Sleep problems. ? Loss of appetite. ? Depression. ? New  or worsening behavior problems. ? Dizziness. ? Unusually fast heartbeat. ? Stomach pains. ? Headaches. Get help right away if:  You have a severe reaction to a medicine.  Your behavior suddenly gets worse. Summary  With treatment and support, you can live with ADHD and manage your symptoms.  The medicines that are most often prescribed for ADHD are stimulants.  Consider taking part in family therapy or  self-help groups with family members or friends.  When you talk with friends and family about your ADHD, be patient and communicate openly.  Take over-the-counter and prescription medicines only as told by your health care provider. Check with your health care provider before taking any new medicines. This information is not intended to replace advice given to you by your health care provider. Make sure you discuss any questions you have with your health care provider. Document Released: 01/07/2017 Document Revised: 01/07/2017 Document Reviewed: 01/07/2017 Elsevier Interactive Patient Education  2019 Elsevier Inc.      Agustina Caroli, MD [facility]

## 2018-11-08 NOTE — Patient Instructions (Addendum)
If you have lab work done today you will be contacted with your lab results within the next 2 weeks.  If you have not heard from [place] then please contact us. The fastest way to get your results is to register for My Chart.   IF you received an x-ray today, you will receive an invoice from [facility]. Please contact [facility] at [phone] with questions or concerns regarding your invoice.   IF you received labwork today, you will receive an invoice from [place]. Please contact [facility] at [phone] with questions or concerns regarding your invoice.   Our billing staff will not be able to assist you with questions regarding bills from these companies.  You will be contacted with the lab results as soon as they are available. The fastest way to get your results is to activate your My Chart account. Instructions are located on the last page of this paperwork. If you have not heard from [place] regarding the results in 2 weeks, please contact this office.      Living With Attention Deficit Hyperactivity Disorder If you have been diagnosed with attention deficit hyperactivity disorder (ADHD), you may be relieved that you now know why you have felt or behaved a certain way. Still, you may feel overwhelmed about the treatment ahead. You may also wonder how to get the support you need and how to deal with the condition day-to-day. With treatment and support, you can live with ADHD and manage your symptoms. How to manage lifestyle changes Managing stress Stress is your body's reaction to life changes and events, both good and bad. To cope with the stress of an ADHD diagnosis, it may help to:  Learn more about ADHD.  Exercise regularly. Even a short daily walk can lower stress levels.  Participate in training or education programs (including social skills training classes) that teach you to deal with symptoms.  Medicines Your health care provider may suggest certain  medicines if he or she feels that they will help to improve your condition. Stimulant medicines are usually prescribed to treat ADHD, and therapy may also be prescribed. It is important to:  Avoid using alcohol and other substances that may prevent your medicines from working properly [facility]).  Talk with your pharmacist or health care provider about all the medicines that you take, their possible side effects, and what medicines are safe to take together.  Make it your goal to take part in all treatment decisions (shared decision-making). Ask about possible side effects of medicines that your health care provider recommends, and tell him or her how you feel about having those side effects. It is best if shared decision-making with your health care provider is part of your total treatment plan. Relationships To strengthen your relationships with family members while treating your condition, consider taking part in family therapy. You might also attend self-help groups alone or with a loved one. Be honest about how your symptoms affect your relationships. Make an effort to communicate respectfully instead of fighting, and find ways to show others that you care. Psychotherapy may be useful in helping you cope with how ADHD affects your relationships. How to recognize changes in your condition The following signs may mean that your treatment is working well and your condition is improving:  Consistently being on time for appointments.  Being more organized at home and work.  Other people noticing improvements in your behavior.  Achieving goals that you set for yourself.  Thinking more  clearly. The following signs may mean that your treatment is not working very well:  Feeling impatience or more confusion.  Missing, forgetting, or being late for appointments.  An increasing sense of disorganization and messiness.  More difficulty in reaching goals that you set for yourself.  Loved  ones becoming angry or frustrated with you. Where to find support Talking to others  Keep emotion out of important discussions and speak in a calm, logical way.  Listen closely and patiently to your loved ones. Try to understand their point of view, and try to avoid getting defensive.  Take responsibility for the consequences of your actions.  Ask that others do not take your behaviors personally.  Aim to solve problems as they come up, and express your feelings instead of bottling them up.  Talk openly about what you need from your loved ones and how they can support you.  Consider going to family therapy sessions or having your family meet with a specialist who deals with ADHD-related behavior problems. Finances Not all insurance plans cover mental health care, so it is important to check with your insurance carrier. If paying for co-pays or counseling services is a problem, search for a local or county mental health care center. Public mental health care services may be offered there at a low cost or no cost when you are not able to see a private health care provider. If you are taking medicine for ADHD, you may be able to get the generic form, which may be less expensive than brand-name medicine. Some makers of prescription medicines also offer help to patients who cannot afford the medicines that they need. Follow these instructions at home:  Take over-the-counter and prescription medicines only as told by your health care provider. Check with your health care provider before taking any new medicines.  Create structure and an organized atmosphere at home. For example: ? Make a list of tasks, then rank them from most important to least important. Work on one task at a time until your listed tasks are done. ? Make a daily schedule and follow it consistently every day. ? Use an appointment calendar, and check it 2 or 3 times a day to keep on track. Keep it with you when you leave the  house. ? Create spaces where you keep certain things, and always put things back in their places after you use them.  Keep all follow-up visits as told by your health care provider. This is important. Questions to ask your health care provider:  What are the risks and benefits of taking medicines?  Would I benefit from therapy?  How often should I follow up with a health care provider? Contact a health care provider if:  You have side effects from your medicines, such as: ? Repeated muscle twitches, coughing, or speech outbursts. ? Sleep problems. ? Loss of appetite. ? Depression. ? New or worsening behavior problems. ? Dizziness. ? Unusually fast heartbeat. ? Stomach pains. ? Headaches. Get help right away if:  You have a severe reaction to a medicine.  Your behavior suddenly gets worse. Summary  With treatment and support, you can live with ADHD and manage your symptoms.  The medicines that are most often prescribed for ADHD are stimulants.  Consider taking part in family therapy or self-help groups with family members or friends.  When you talk with friends and family about your ADHD, be patient and communicate openly.  Take over-the-counter and prescription medicines only as told by  your health care provider. Check with your health care provider before taking any new medicines. This information is not intended to replace advice given to you by your health care provider. Make sure you discuss any questions you have with your health care provider. Document Released: 01/07/2017 Document Revised: 01/07/2017 Document Reviewed: 01/07/2017 Elsevier Interactive Patient Education  2019 [employer].

## 2018-11-18 ENCOUNTER — Other Ambulatory Visit: Payer: Self-pay | Admitting: Family Medicine

## 2018-11-22 ENCOUNTER — Telehealth: Payer: Self-pay | Admitting: Family Medicine

## 2018-11-22 NOTE — Telephone Encounter (Signed)
Please advise 

## 2018-11-22 NOTE — Telephone Encounter (Signed)
Copied from [place] [phone]. Topic: Quick Communication - Rx Refill/Question >> Nov 22, 2018  3:12 PM [place], [place] D wrote: Medication: amphetamine-dextroamphetamine (ADDERALL XR) 10 MG 24 hr capsule / Pt stated that her insurance will not cover rx dosage. She is requesting change to one 20MG  time release and two 10MG  daily. Please advise.   Has the patient contacted their pharmacy? Yes.   (Agent: If no, request that the patient contact the pharmacy for the refill.) (Agent: If yes, when and what did the pharmacy advise?)  Preferred Pharmacy (with phone number or street name): [facility] [phone] - [place], [place] - [place] [phone] (Phone) [phone] (Fax)  Agent: Please be advised that RX refills may take up to 3 business days. We ask that you follow-up with your pharmacy.

## 2018-11-22 NOTE — Telephone Encounter (Signed)
Patient is requesting change in her Rx due to insurance changes. Sent for PCP review.

## 2018-11-29 ENCOUNTER — Other Ambulatory Visit: Payer: Self-pay | Admitting: Family Medicine

## 2018-11-29 MED ORDER — AMPHETAMINE-DEXTROAMPHET ER 20 MG PO CP24: 20.0000 mg | ORAL_CAPSULE | Freq: Every day | ORAL | 0 refills | Status: DC

## 2018-11-29 MED ORDER — AMPHETAMINE-DEXTROAMPHETAMINE 10 MG PO TABS: 10.0000 mg | ORAL_TABLET | Freq: Two times a day (BID) | ORAL | 0 refills | Status: DC

## 2018-11-29 NOTE — Progress Notes (Signed)
Last appt with me in Oct 2019 Last OV in Feb requested dose change due to insurance Now reading to go back to previous dose per insurance approval adderall XR 20mg  once a day adderall IR 20mg  BID Generic only Last rx refilled 11/08/2018 per pmp reviewed - 120 tabs of ER 10mg  New rx sent dated to 12/06/2018

## 2018-11-29 NOTE — Telephone Encounter (Signed)
Pt calling to check status. Pt would like a call back as soon as possible regarding medication. Look like last message was sent to Dr Brigitte Pulse. Please advise

## 2018-11-30 NOTE — Telephone Encounter (Signed)
Medication has been sent to pharmacy.  °

## 2018-12-29 ENCOUNTER — Telehealth: Payer: Self-pay | Admitting: Family Medicine

## 2018-12-29 NOTE — Telephone Encounter (Signed)
Copied from [place] [phone]. Topic: Quick Communication - Rx Refill/Question >> Dec 29, 2018  3:01 PM Richardo Priest, [place] wrote: Medication:  amphetamine-dextroamphetamine (ADDERALL XR) 20 MG 24 hr capsule  Has the patient contacted their pharmacy?  (Agent: If no, request that the patient contact the pharmacy for the refill.) (Agent: If yes, when and what did the pharmacy advise?)  Preferred Pharmacy (with phone number or street name): ***  Agent: Please be advised that RX refills may take up to 3 business days. We ask that you follow-up with your pharmacy.

## 2018-12-30 MED ORDER — AMPHETAMINE-DEXTROAMPHET ER 20 MG PO CP24: 20.0000 mg | ORAL_CAPSULE | Freq: Every day | ORAL | 0 refills | Status: DC

## 2018-12-30 NOTE — Telephone Encounter (Signed)
Please refill if possible

## 2018-12-30 NOTE — Telephone Encounter (Signed)
pmp reviewed Refilled for April and May Needs appointment prior to June refill, please schedule first 2 weeks of June. Thanks

## 2019-01-03 NOTE — Telephone Encounter (Signed)
Pt called in requesting a refill for amphetamine-dextroamphetamine (ADDERALL) 10 MG tablet. Pt says last month she only had enough money to pay for the 10MG  Rx of Adderall so she took multiple( 40MG ) daily to try to supplement from not taking both Rx as should, pt says by her doing that it caused her to run out faster and also through off her fill date.   Pt would like to know if provider could okay with the pharmacy her refill for 10MG  also.

## 2019-01-04 NOTE — Telephone Encounter (Signed)
Please schedule patient to further discuss. thanks

## 2019-01-04 NOTE — Telephone Encounter (Signed)
Please advise as how you would like to handle.

## 2019-01-06 IMAGING — CT CT HEAD W/O CM
3 series · 14 of 47 positions shown, 16 images · non-contrast
Comparison: None.

CLINICAL DATA: Found down. Possible overdose. Possible head trauma.
Initial encounter.

EXAM:
CT HEAD WITHOUT CONTRAST
TECHNIQUE: Contiguous axial images were obtained from the base of the skull
through the vertex without intravenous contrast.

[Series 2: head wo · axial · 0.47mm/px · z∈[-224,-89]mm · 8 of 33 slices shown, 10 images]
[im 3/33  brain]
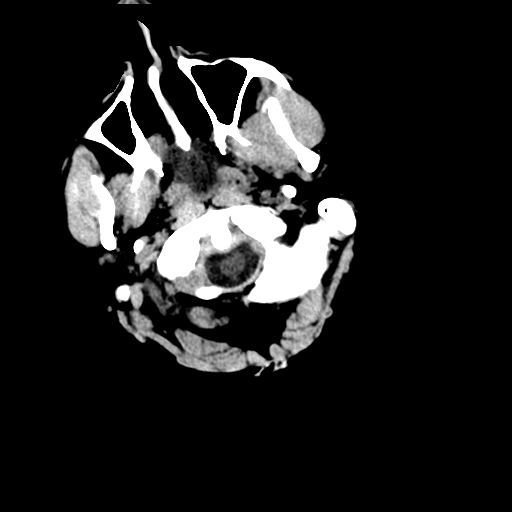
[im 3/33  bone]
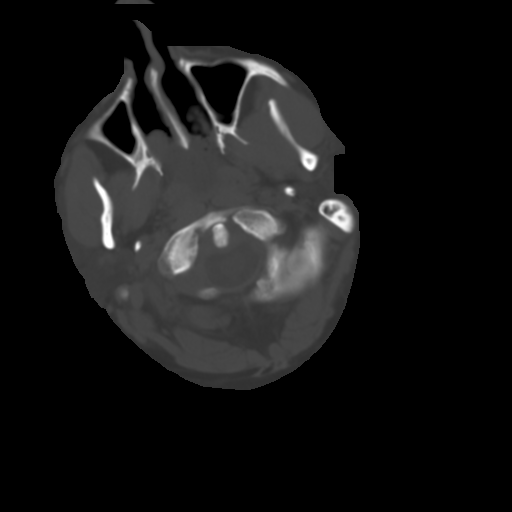
[im 7/33  brain]
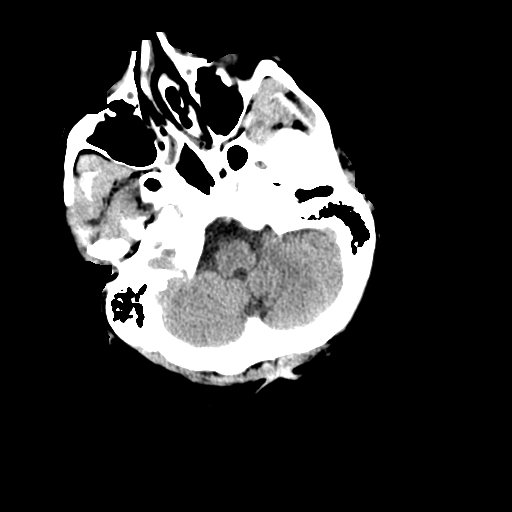
[im 10/33  brain]
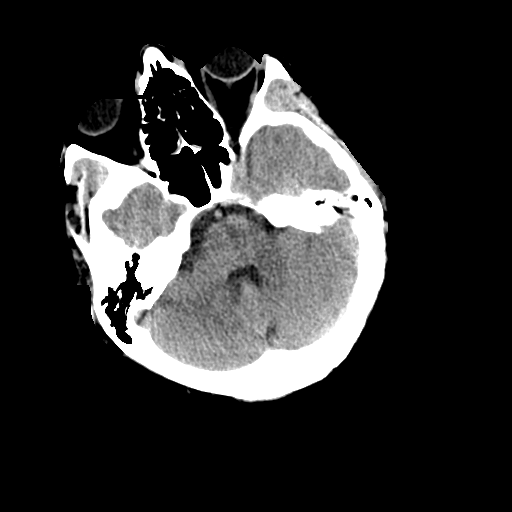
[im 15/33  brain]
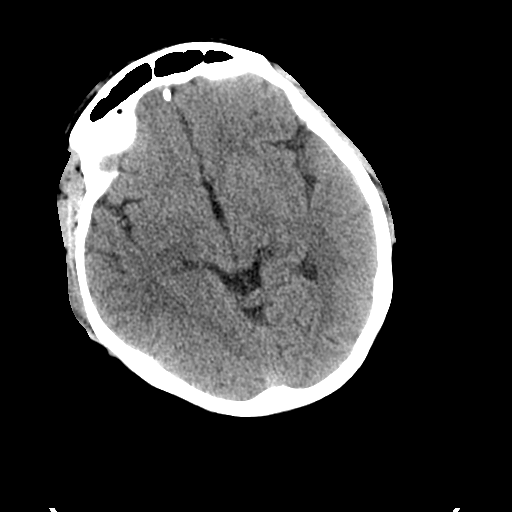
[im 18/33  brain]
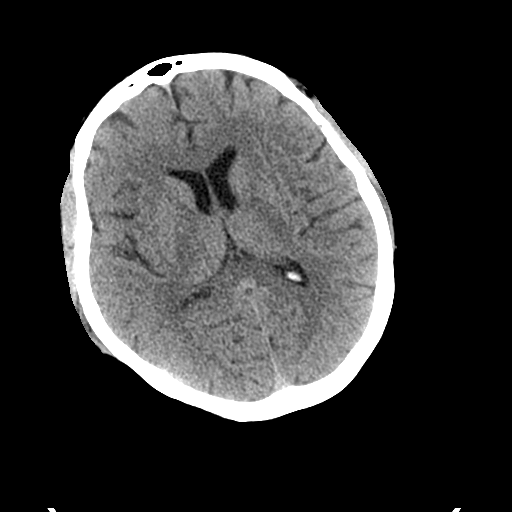
[im 18/33  bone]
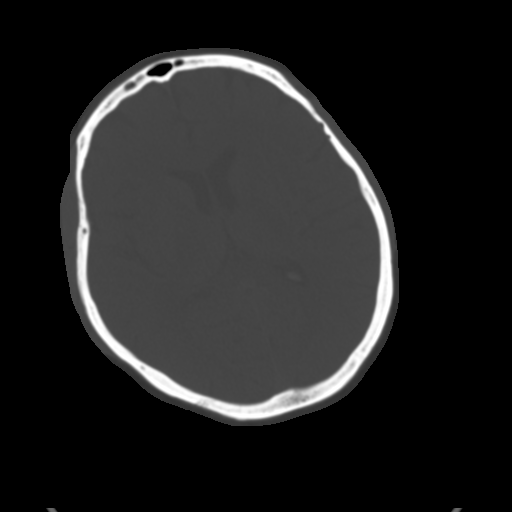
[im 23/33  brain]
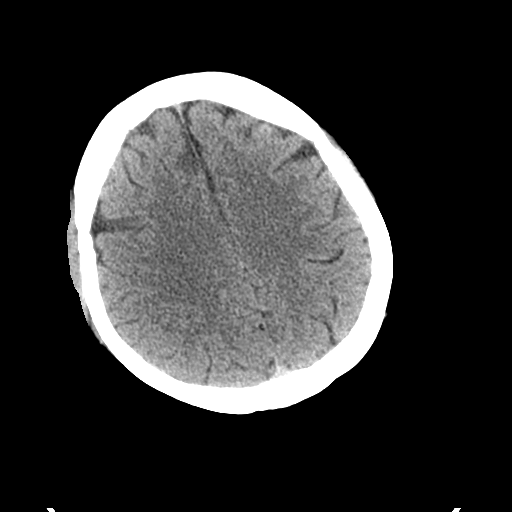
[im 26/33  brain]
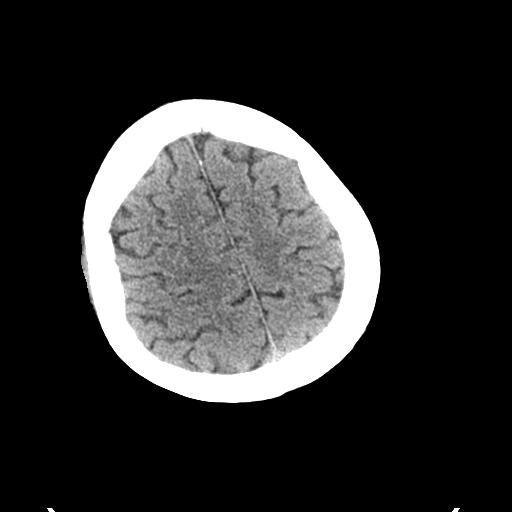
[im 30/33  brain]
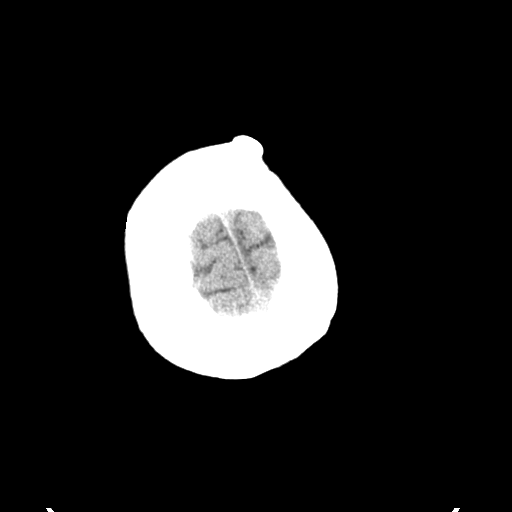

[Series 4: coronal soft tissue · coronal · 0.32mm/px · 3 of 79 slices shown]
[im 27/79  brain]
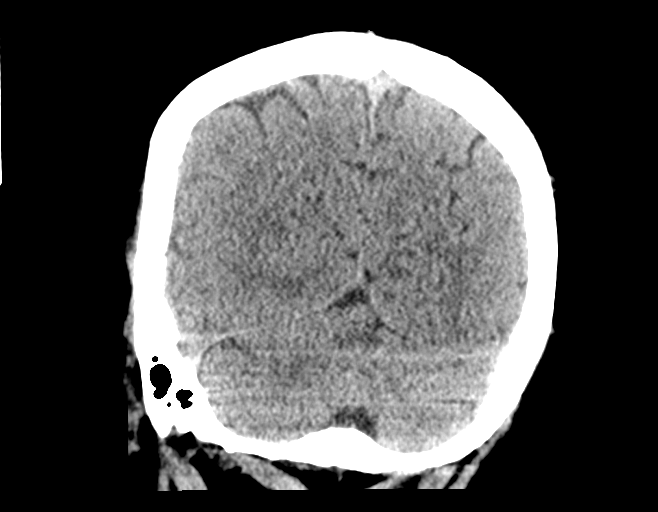
[im 35/79  brain]
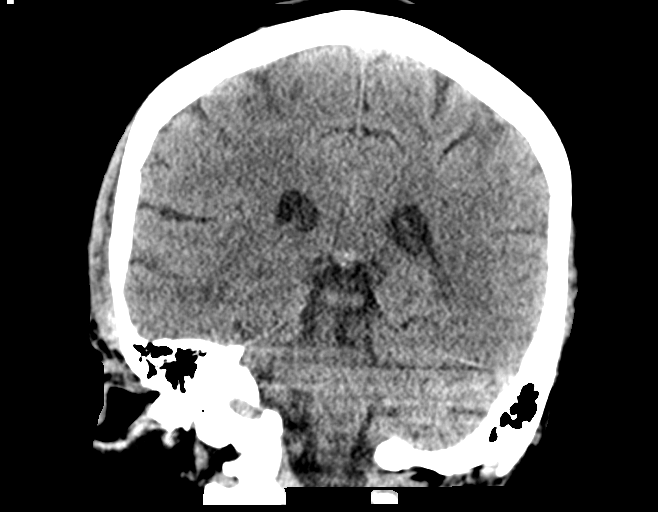
[im 44/79  brain]
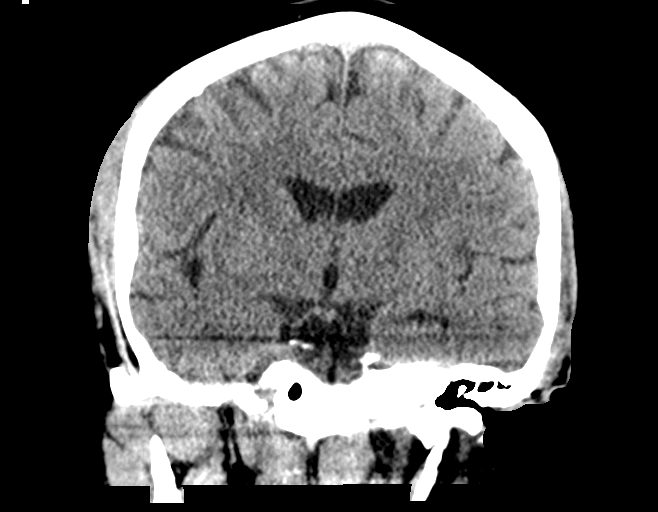

[Series 5: sagittal soft tissue · sagittal · 0.32mm/px · 3 of 73 slices shown]
[im 25/73  brain]
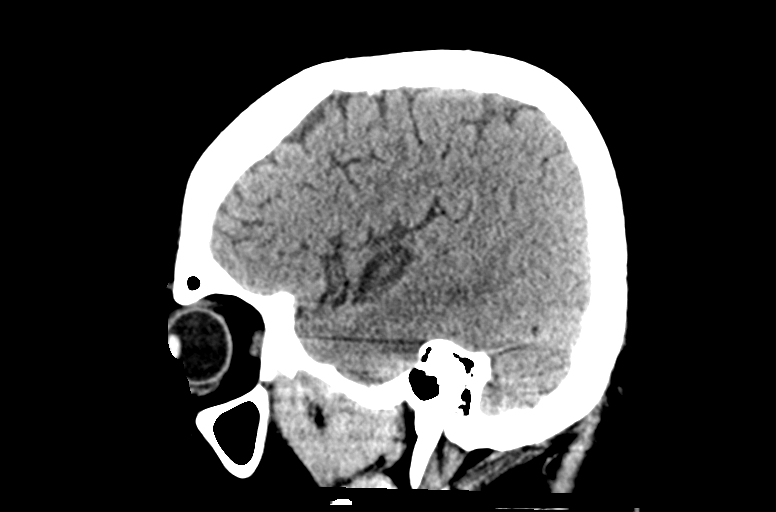
[im 37/73  brain]
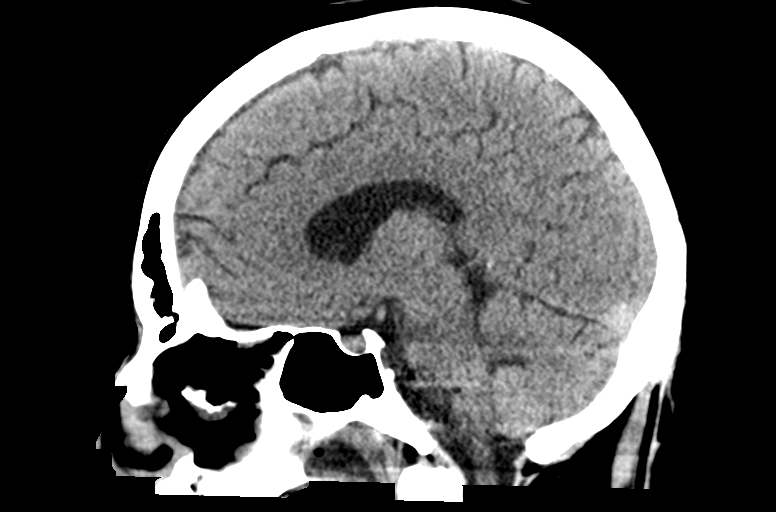
[im 49/73  brain]
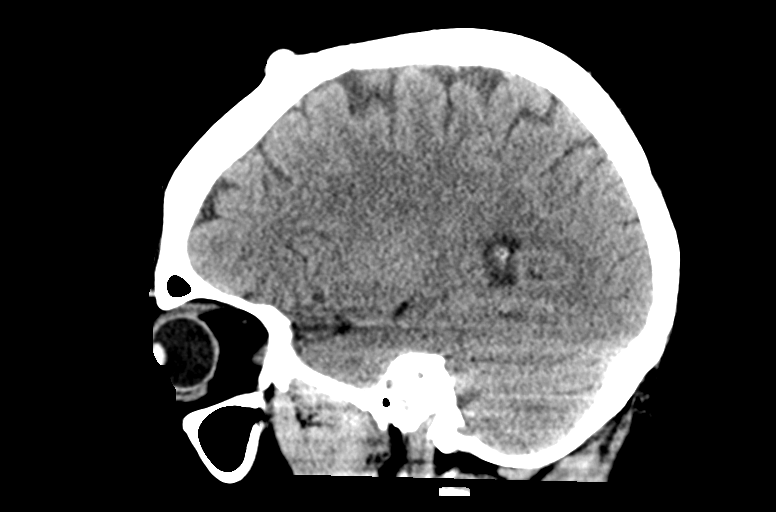

[14 of 47 positions shown; findings below may reference images not displayed]

FINDINGS: Brain: There is no evidence of acute infarct, intracranial
hemorrhage, mass, midline shift, or extra-axial fluid collection.
The ventricles and sulci are normal.

Vascular: No hyperdense vessel.

Skull: No fracture.  1.5 cm left frontal skull osteoma.

Sinuses/Orbits: Trace left sphenoid sinus fluid. Trace bilateral
maxillary sinus mucosal thickening. Trace left mastoid effusion.
Unremarkable orbits.

Other: Mild right scalp swelling.
IMPRESSION: 1. No evidence of acute intracranial abnormality. Unremarkable CT
appearance of the brain.
2. Right scalp swelling.

## 2019-01-06 NOTE — Telephone Encounter (Signed)
I have spoke with pt and made her an app with you on 01/16/19. I tried to explain to her-she needs to be seen for this refill. She explained to me-pt has a script ready at her pharmacy for her to pick up.  The script cant be filled until the 25. She would like to know if the date could be changed, so she can pick up now. Please advise at [phone]

## 2019-01-07 IMAGING — DX DG CHEST 1V PORT
2 series · 2 of 2 positions shown · non-contrast
Comparison: Radiograph yesterday.

CLINICAL DATA: Ventilator dependent.

EXAM:
PORTABLE CHEST 1 VIEW

[chest ap (1 of 2)]
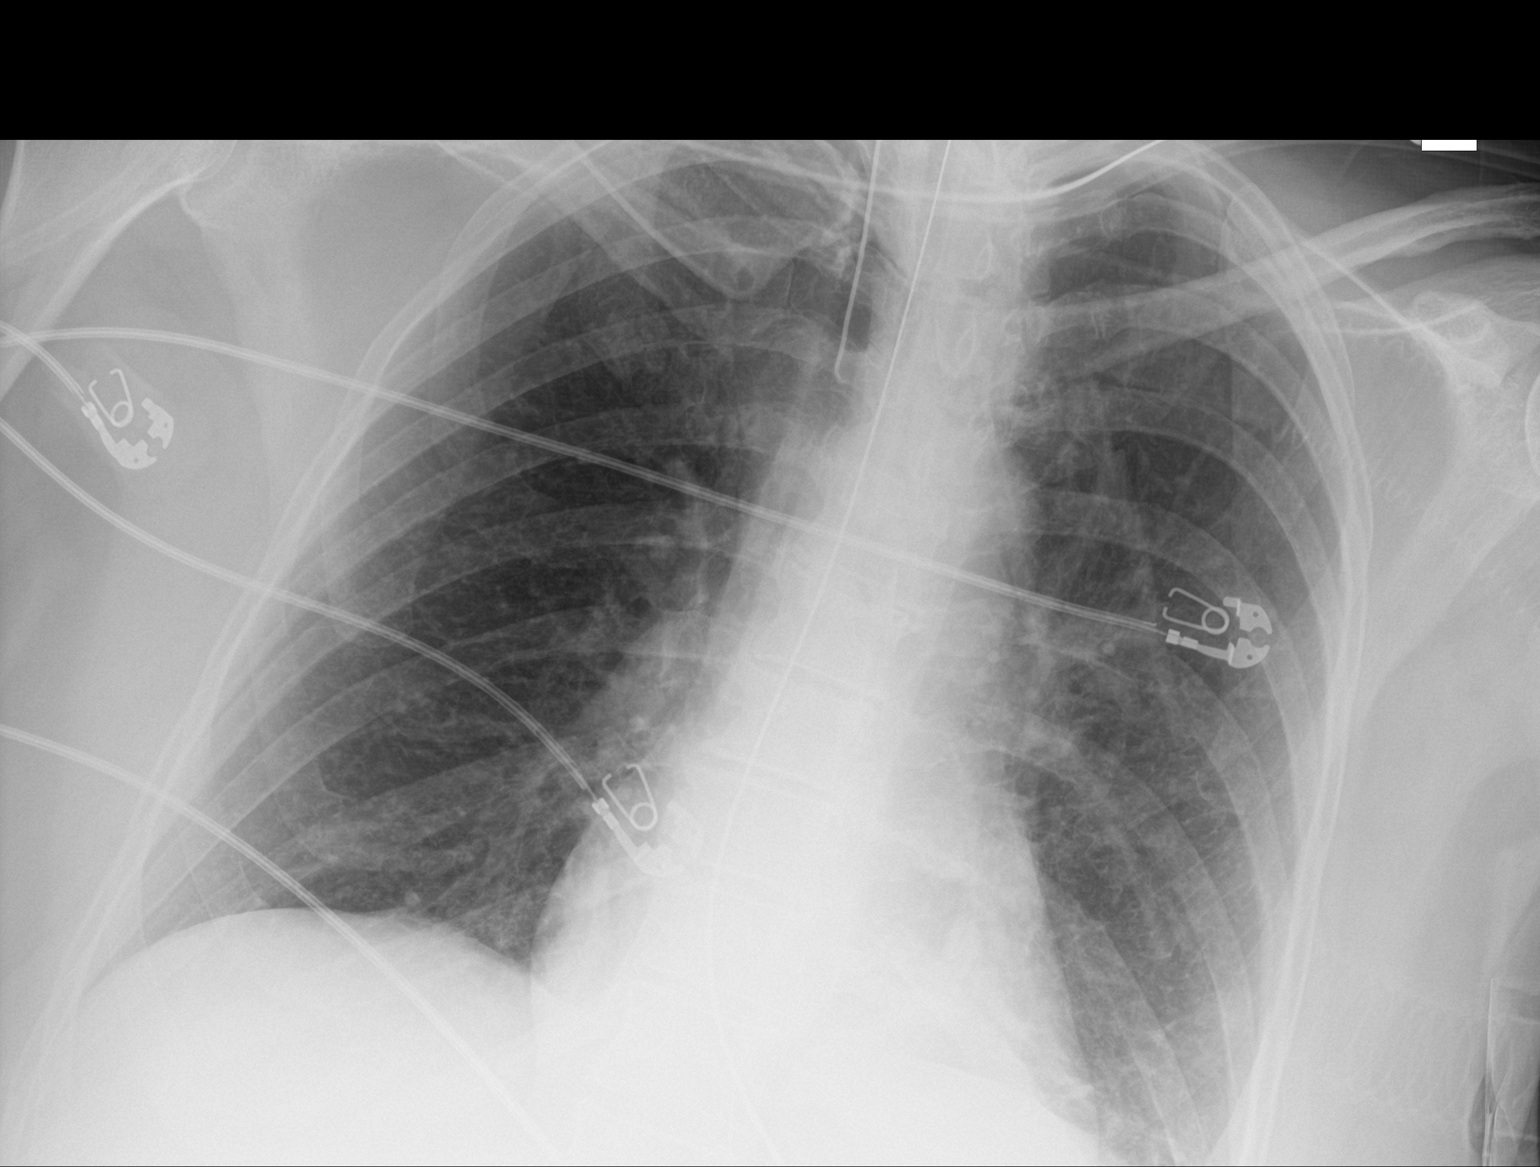

[chest ap (2 of 2)]
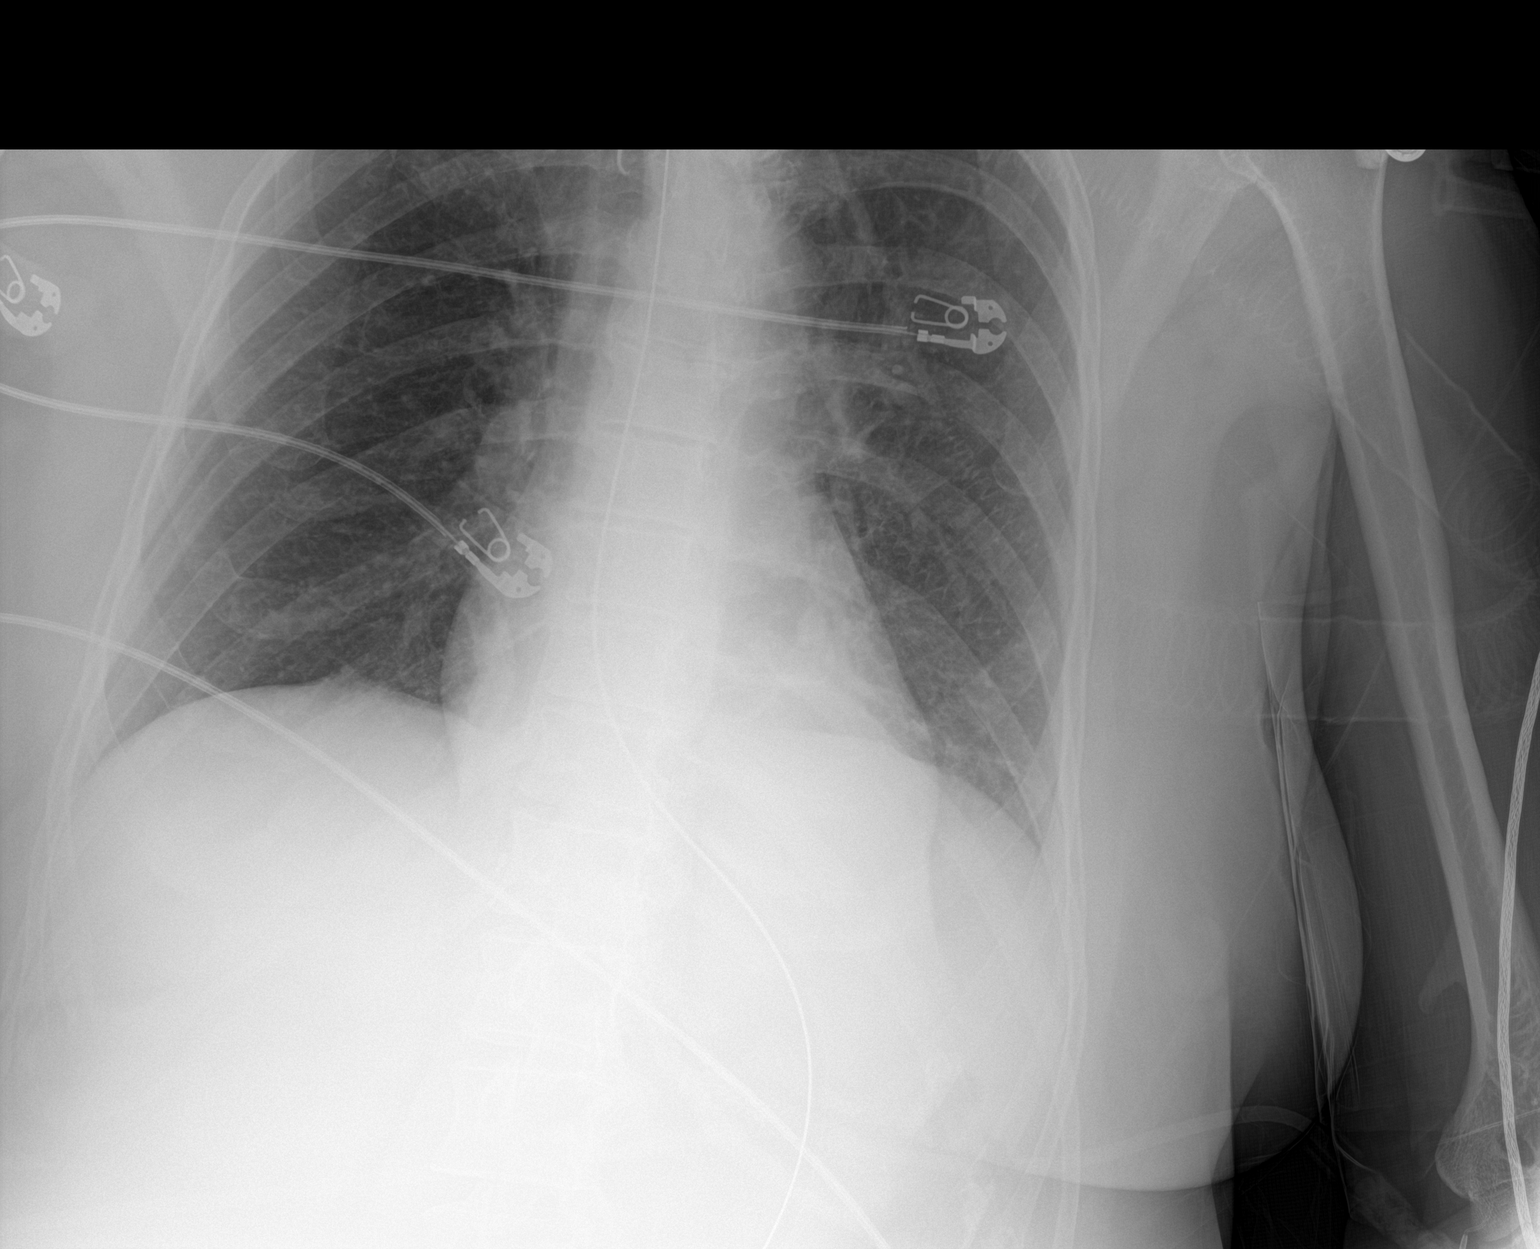

[2 of 2 positions shown; findings below may reference images not displayed]

FINDINGS: Tip of the endotracheal tube at the thoracic inlet. Enteric tube in
place below the diaphragm. Normal heart size and mediastinal
contours. Developing left infrahilar atelectasis. No confluent
consolidation. No pulmonary edema, large pneumothorax or pleural
effusion.
IMPRESSION: Developing left lung base atelectasis.

## 2019-01-12 ENCOUNTER — Telehealth: Payer: Self-pay | Admitting: Family Medicine

## 2019-01-12 NOTE — Telephone Encounter (Signed)
PA needed

## 2019-01-12 NOTE — Telephone Encounter (Signed)
Copied from [place]. Topic: Quick Communication - Rx Refill/Question >> Jan 12, 2019  3:03 PM Sheppard Coil, [employer] wrote: Medication:  amphetamine-dextroamphetamine (ADDERALL XR) 20 MG 24 hr capsule  amphetamine-dextroamphetamine (ADDERALL) 10 MG tablet  Pt states that her insurance is requiring prior authorization for these medications.

## 2019-01-13 NOTE — Telephone Encounter (Signed)
Medication was approved by her insurance company. Spoke with pharmacy and they will contact the patient once the medication is ready for pick up. Key: [id number]

## 2019-01-16 ENCOUNTER — Telehealth (INDEPENDENT_AMBULATORY_CARE_PROVIDER_SITE_OTHER): Payer: 59 | Admitting: Family Medicine

## 2019-01-16 ENCOUNTER — Telehealth: Payer: Self-pay | Admitting: Family Medicine

## 2019-01-16 ENCOUNTER — Other Ambulatory Visit: Payer: Self-pay

## 2019-01-16 DIAGNOSIS — Z13228 Encounter for screening for other metabolic disorders: Secondary | ICD-10-CM

## 2019-01-16 DIAGNOSIS — Z1212 Encounter for screening for malignant neoplasm of rectum: Secondary | ICD-10-CM

## 2019-01-16 DIAGNOSIS — Z130 Encounter for screening for diseases of the blood and blood-forming organs and certain disorders involving the immune mechanism: Secondary | ICD-10-CM

## 2019-01-16 DIAGNOSIS — Z1211 Encounter for screening for malignant neoplasm of colon: Secondary | ICD-10-CM

## 2019-01-16 DIAGNOSIS — Z1329 Encounter for screening for other suspected endocrine disorder: Secondary | ICD-10-CM

## 2019-01-16 DIAGNOSIS — F900 Attention-deficit hyperactivity disorder, predominantly inattentive type: Secondary | ICD-10-CM | POA: Diagnosis not present

## 2019-01-16 DIAGNOSIS — Z13220 Encounter for screening for lipoid disorders: Secondary | ICD-10-CM

## 2019-01-16 DIAGNOSIS — F332 Major depressive disorder, recurrent severe without psychotic features: Secondary | ICD-10-CM

## 2019-01-16 MED ORDER — AMPHETAMINE-DEXTROAMPHET ER 20 MG PO CP24: 20.0000 mg | ORAL_CAPSULE | Freq: Every day | ORAL | 0 refills | Status: DC

## 2019-01-16 MED ORDER — AMPHETAMINE-DEXTROAMPHETAMINE 10 MG PO TABS: 10.0000 mg | ORAL_TABLET | Freq: Two times a day (BID) | ORAL | 0 refills | Status: DC

## 2019-01-16 MED ORDER — HYDROXYZINE HCL 25 MG PO TABS: ORAL_TABLET | ORAL | 1 refills | Status: DC

## 2019-01-16 MED ORDER — AMPHETAMINE-DEXTROAMPHET ER 20 MG PO CP24: 20.0000 mg | ORAL_CAPSULE | ORAL | 0 refills | Status: DC

## 2019-01-16 NOTE — Progress Notes (Signed)
Pt is having anxiety and depression (15), gad7(11). Very somber mood, became tearful. Says that she has been communication with her biological dad who also suffers from depression. She feels her depression is hereditary and not situational as she believed. Also having menopause symptoms. She needs a refill on the adderall 10mg  only.

## 2019-01-16 NOTE — Progress Notes (Signed)
Virtual Visit Note  I connected with patient on 01/16/19 at 1054 by telephone and verified that I am speaking with the correct person using two identifiers. Crystal Williamson is currently located at home and patient is currently with them during visit. The provider, Rutherford Guys, MD is located in their office at time of visit.  I discussed the limitations, risks, security and privacy concerns of performing an evaluation and management service by telephone and the availability of in person appointments. I also discussed with the patient that there may be a patient responsible charge related to this service. The patient expressed understanding and agreed to proceed.   CC: depression and ADD  HPI ? Last OV Oct 2019 Decreased cymbalta from 90 to 60mg  for cost concerns  She tried to decrease cymbalta but was did not tolerate at all as she was very irritable Reports that depression is stable Denies any SI cymbalta was started during [facility] related to SI attempt ADD controlled on ER 20mg  daily and IR 10mg  BID Denies any side effects pmp reviewed  Requesting routine HCM labs H/o hyst  Has not done colon cancer screening - her father might have had colon cancer    Allergies  Allergen Reactions  . Codeine Itching    REACTION: N \\T \ V  . Hydromorphone Itching  . Opium   . Percocet [Oxycodone-Acetaminophen] Itching    Prior to Admission medications   Medication Sig Start Date End Date Taking? Authorizing Provider  amphetamine-dextroamphetamine (ADDERALL XR) 20 MG 24 hr capsule Take 1 capsule (20 mg total) by mouth daily. 01/06/19   Rutherford Guys, MD  amphetamine-dextroamphetamine (ADDERALL XR) 20 MG 24 hr capsule Take 1 capsule (20 mg total) by mouth daily. 02/05/19   Rutherford Guys, MD  amphetamine-dextroamphetamine (ADDERALL) 10 MG tablet Take 1 tablet (10 mg total) by mouth 2 (two) times daily with a meal. 12/06/18   Rutherford Guys, MD  DULoxetine (CYMBALTA) 30 MG capsule  Take 3 capsules (90 mg total) by mouth daily. 11/08/18 02/06/19  Horald Pollen, MD  Estradiol (ELESTRIN) 0.52 MG/0.87 GM (0.06%) GEL APPLY 2 PUMPS ONTO 1 ARM AT BEDTIME 11/21/18   Rutherford Guys, MD  hydrOXYzine (ATARAX/VISTARIL) 25 MG tablet TAKE 1 TABLET BY MOUTH AT BEDTIME AS NEEDED FOR ANXIETY 06/28/18   Rutherford Guys, MD  ORACEA 40 MG capsule Take 1 capsule (40 mg total) by mouth every morning. 06/28/18   Rutherford Guys, MD  terbinafine (LAMISIL) 250 MG tablet Take 1 tablet (250 mg total) by mouth daily. **NEEDS OFFICE VISIT FOR REFILS** 09/24/18   [name], MD  traZODone (DESYREL) 50 MG tablet Take 1-2 tablets (50-100 mg total) by mouth at bedtime as needed. 06/28/18   Rutherford Guys, MD    Past Medical History:  Diagnosis Date  . Allergy   . Depression   . Fibroid   . Headache(784.0)   . No pertinent past medical history   . Varicose veins    bilateral    Past Surgical History:  Procedure Laterality Date  . ABDOMINAL HYSTERECTOMY  06/01/2012   Procedure: HYSTERECTOMY ABDOMINAL;  Surgeon: Terrance Mass, MD;  Location: [facility];  Service: Gynecology;  Laterality: N/A;  . SEPTOPLASTY    . VARICOSE VEIN SURGERY    . WISDOM TOOTH EXTRACTION      Social History   Tobacco Use  . Smoking status: Current Some Day Smoker    Packs/day: 0.25    Types:  Cigarettes  . Smokeless tobacco: Never Used  . Tobacco comment: 5 cig a day  Substance Use Topics  . Alcohol use: Yes    Alcohol/week: 0.0 standard drinks    Comment: 3-4x week     Family History  Problem Relation Age of Onset  . Colon cancer Paternal Grandfather   . Diabetes Paternal Grandfather        type 2   . Cancer Paternal Grandfather        COLON  . Breast cancer Paternal Grandmother   . Stroke Maternal Grandmother   . Lupus Mother   . Lumbar disc disease Mother   . Varicose Veins Mother   . Cancer Maternal Grandfather   . Diabetes Maternal Grandfather     ROS Per hpi  Objective  Vitals as  reported by the patient: none   ASSESSMENT and PLAN  1. Attention deficit hyperactivity disorder (ADHD), predominantly inattentive type Controlled. Continue current regime.   2. MDD (major depressive disorder), recurrent severe, without psychosis ([place]) Stable with partial control. Patient however feels that this is the best regime she has been on and is not interested in further changes. Denies any SI.   3. Screening for hyperlipidemia - Lipid panel; Future  4. Screening for deficiency anemia - CBC; Future  5. Screening for thyroid disorder - TSH; Future  6. Screening for endocrine, metabolic and immunity disorder - Comprehensive metabolic panel; Future  7. Screening for colorectal cancer - Cologuard; Future  Other orders - amphetamine-dextroamphetamine (ADDERALL) 10 MG tablet; Take 1 tablet (10 mg total) by mouth 2 (two) times daily with a meal. - amphetamine-dextroamphetamine (ADDERALL XR) 20 MG 24 hr capsule; Take 1 capsule (20 mg total) by mouth daily. - hydrOXYzine (ATARAX/VISTARIL) 25 MG tablet; TAKE 1 TABLET BY MOUTH AT BEDTIME AS NEEDED FOR ANXIETY - amphetamine-dextroamphetamine (ADDERALL XR) 20 MG 24 hr capsule; Take 1 capsule (20 mg total) by mouth daily. - amphetamine-dextroamphetamine (ADDERALL XR) 20 MG 24 hr capsule; Take 1 capsule (20 mg total) by mouth every morning. - amphetamine-dextroamphetamine (ADDERALL) 10 MG tablet; Take 1 tablet (10 mg total) by mouth 2 (two) times daily. - amphetamine-dextroamphetamine (ADDERALL) 10 MG tablet; Take 1 tablet (10 mg total) by mouth 2 (two) times daily with a meal.  FOLLOW-UP: 3 months   The above assessment and management plan was discussed with the patient. The patient verbalized understanding of and has agreed to the management plan. Patient is aware to call the clinic if symptoms persist or worsen. Patient is aware when to return to the clinic for a follow-up visit. Patient educated on when it is appropriate to go to  the emergency department.    I provided 27 minutes of non-face-to-face time during this encounter.  Rutherford Guys, MD Primary Care at [facility], [place] [address] Ph.  [phone] Fax [phone]

## 2019-01-16 NOTE — Telephone Encounter (Signed)
Called pt Crystal Williamson for her to call back and scheduled lab worl 1-2 weeks from todays visite nad 3 month follow up from today visit  FR

## 2019-03-16 ENCOUNTER — Telehealth: Payer: Self-pay | Admitting: Family Medicine

## 2019-03-16 NOTE — Telephone Encounter (Signed)
Test

## 2019-03-17 ENCOUNTER — Telehealth (INDEPENDENT_AMBULATORY_CARE_PROVIDER_SITE_OTHER): Payer: 59 | Admitting: Family Medicine

## 2019-03-17 VITALS — Ht 68.5 in

## 2019-03-17 DIAGNOSIS — F900 Attention-deficit hyperactivity disorder, predominantly inattentive type: Secondary | ICD-10-CM

## 2019-03-17 DIAGNOSIS — Z5181 Encounter for therapeutic drug level monitoring: Secondary | ICD-10-CM

## 2019-03-17 DIAGNOSIS — F332 Major depressive disorder, recurrent severe without psychotic features: Secondary | ICD-10-CM

## 2019-03-17 MED ORDER — DOXYCYCLINE HYCLATE 50 MG PO CAPS: 50.0000 mg | ORAL_CAPSULE | Freq: Every day | ORAL | 0 refills | Status: DC

## 2019-03-17 MED ORDER — AMPHETAMINE-DEXTROAMPHET ER 20 MG PO CP24: 20.0000 mg | ORAL_CAPSULE | Freq: Two times a day (BID) | ORAL | 0 refills | Status: DC

## 2019-03-17 MED ORDER — DULOXETINE HCL 30 MG PO CPEP: 90.0000 mg | ORAL_CAPSULE | Freq: Every day | ORAL | 0 refills | Status: DC

## 2019-03-17 MED ORDER — AMPHETAMINE-DEXTROAMPHET ER 20 MG PO CP24: 20.0000 mg | ORAL_CAPSULE | ORAL | 0 refills | Status: DC

## 2019-03-17 MED ORDER — TERBINAFINE HCL 250 MG PO TABS: 250.0000 mg | ORAL_TABLET | Freq: Every day | ORAL | 0 refills | Status: DC

## 2019-03-17 NOTE — Progress Notes (Signed)
Virtual Visit Note  I connected with patient on 03/17/19 at 322pm by phone and verified that I am speaking with the correct person using two identifiers. Crystal Williamson is currently located at home and patient is currently with them during visit. The provider, Rutherford Guys, MD is located in their office at time of visit.  I discussed the limitations, risks, security and privacy concerns of performing an evaluation and management service by telephone and the availability of in person appointments. I also discussed with the patient that there may be a patient responsible charge related to this service. The patient expressed understanding and agreed to proceed.   CC: medication refills   HPI  Patient with depression and anxiety Patient was terminated from work, loses her insurance at the end of this month She will be using good rx to cover her medications Needs paper rx as she is not sure which pharmacy  Depression is well controlled on duloxetine 90mg  once a day ADD is better controlled on adderall ER 20mg  BID, wo using IR? Requesting refill of Lamisil as has had return of toe nail fungus She is also requesting generic doxy as unable to cost of oracea Having hotflashes, weight gain, constipation, using kratom   Allergies  Allergen Reactions  . Codeine Itching    REACTION: N \\T \ V  . Hydromorphone Itching  . Opium   . Percocet [Oxycodone-Acetaminophen] Itching    Prior to Admission medications   Medication Sig Start Date End Date Taking? Authorizing Provider  amphetamine-dextroamphetamine (ADDERALL XR) 20 MG 24 hr capsule Take 1 capsule (20 mg total) by mouth daily. 01/16/19  Yes Rutherford Guys, MD  amphetamine-dextroamphetamine (ADDERALL XR) 20 MG 24 hr capsule Take 1 capsule (20 mg total) by mouth daily. 02/05/19  Yes Rutherford Guys, MD  amphetamine-dextroamphetamine (ADDERALL XR) 20 MG 24 hr capsule Take 1 capsule (20 mg total) by mouth every morning. 01/16/19  Yes  Rutherford Guys, MD  amphetamine-dextroamphetamine (ADDERALL) 10 MG tablet Take 1 tablet (10 mg total) by mouth 2 (two) times daily with a meal. 01/16/19  Yes Rutherford Guys, MD  amphetamine-dextroamphetamine (ADDERALL) 10 MG tablet Take 1 tablet (10 mg total) by mouth 2 (two) times daily. 01/16/19  Yes Rutherford Guys, MD  amphetamine-dextroamphetamine (ADDERALL) 10 MG tablet Take 1 tablet (10 mg total) by mouth 2 (two) times daily with a meal. 01/16/19  Yes Rutherford Guys, MD  Estradiol (ELESTRIN) 0.52 MG/0.87 GM (0.06%) GEL APPLY 2 PUMPS ONTO 1 ARM AT BEDTIME 11/21/18  Yes Rutherford Guys, MD  hydrOXYzine (ATARAX/VISTARIL) 25 MG tablet TAKE 1 TABLET BY MOUTH AT BEDTIME AS NEEDED FOR ANXIETY 01/16/19  Yes Rutherford Guys, MD  ORACEA 40 MG capsule Take 1 capsule (40 mg total) by mouth every morning. 06/28/18  Yes Rutherford Guys, MD  terbinafine (LAMISIL) 250 MG tablet Take 1 tablet (250 mg total) by mouth daily. **NEEDS OFFICE VISIT FOR REFILS** 09/24/18  Yes [name], MD  traZODone (DESYREL) 50 MG tablet Take 1-2 tablets (50-100 mg total) by mouth at bedtime as needed. 06/28/18  Yes Rutherford Guys, MD  DULoxetine (CYMBALTA) 30 MG capsule Take 3 capsules (90 mg total) by mouth daily. 11/08/18 02/06/19  Horald Pollen, MD    Past Medical History:  Diagnosis Date  . Allergy   . Depression   . Fibroid   . Headache(784.0)   . No pertinent past medical history   . Varicose veins  bilateral    Past Surgical History:  Procedure Laterality Date  . ABDOMINAL HYSTERECTOMY  06/01/2012   Procedure: HYSTERECTOMY ABDOMINAL;  Surgeon: Terrance Mass, MD;  Location: [facility];  Service: Gynecology;  Laterality: N/A;  . SEPTOPLASTY    . VARICOSE VEIN SURGERY    . WISDOM TOOTH EXTRACTION      Social History   Tobacco Use  . Smoking status: Current Some Day Smoker    Packs/day: 0.25    Types: Cigarettes  . Smokeless tobacco: Never Used  . Tobacco comment: 5 cig a day  Substance Use  Topics  . Alcohol use: Yes    Alcohol/week: 0.0 standard drinks    Comment: 3-4x week     Family History  Problem Relation Age of Onset  . Colon cancer Paternal Grandfather   . Diabetes Paternal Grandfather        type 2   . Cancer Paternal Grandfather        COLON  . Breast cancer Paternal Grandmother   . Stroke Maternal Grandmother   . Lupus Mother   . Lumbar disc disease Mother   . Varicose Veins Mother   . Cancer Maternal Grandfather   . Diabetes Maternal Grandfather     ROS Per hpi  Objective  Vitals as reported by the patient: none   ASSESSMENT and PLAN  1. Attention deficit hyperactivity disorder (ADHD), predominantly inattentive type pmp reviwed - ToxASSURE Select 13 (MW), Urine; Future  2. MDD (major depressive disorder), recurrent severe, without psychosis ([place]) - DULoxetine (CYMBALTA) 30 MG capsule; Take 3 capsules (90 mg total) by mouth daily.  3. Medication monitoring encounter - ToxASSURE Select 13 (MW), Urine; Future  Overall patient stable on her current medications. Refills provided for 3 months, hopefully will have insurance by then. Discussed d/c new supplement as cause of her new sx. Patient to do HCM labs ordered last OV when she comes in to pickup her rx.   Other orders - doxycycline (VIBRAMYCIN) 50 MG capsule; Take 1 capsule (50 mg total) by mouth daily. - amphetamine-dextroamphetamine (ADDERALL XR) 20 MG 24 hr capsule; Take 1 capsule (20 mg total) by mouth 2 (two) times daily. - amphetamine-dextroamphetamine (ADDERALL XR) 20 MG 24 hr capsule; Take 1 capsule (20 mg total) by mouth 2 (two) times daily. - terbinafine (LAMISIL) 250 MG tablet; Take 1 tablet (250 mg total) by mouth daily. - amphetamine-dextroamphetamine (ADDERALL XR) 20 MG 24 hr capsule; Take 1 capsule (20 mg total) by mouth 2 (two) times daily.  FOLLOW-UP: 3 months   The above assessment and management plan was discussed with the patient. The patient verbalized understanding of  and has agreed to the management plan. Patient is aware to call the clinic if symptoms persist or worsen. Patient is aware when to return to the clinic for a follow-up visit. Patient educated on when it is appropriate to go to the emergency department.    I provided 23 minutes of non-face-to-face time during this encounter.  Rutherford Guys, MD Primary Care at [facility] [place], [place] [address] Ph.  [phone] Fax [phone]

## 2019-03-17 NOTE — Progress Notes (Signed)
Chief Complaint: Med Refill: Adderall, 10 mg and 20mg , Oracea 40mg ,cymbalta 30mg , terbinafine [facility] 250mg ,   Pt states due to insurance she would like paper prescription

## 2019-03-17 NOTE — Telephone Encounter (Signed)
Paper prescriptions are at the front desk ready for pt pick up

## 2019-03-21 ENCOUNTER — Other Ambulatory Visit: Payer: Self-pay

## 2019-03-21 ENCOUNTER — Ambulatory Visit (INDEPENDENT_AMBULATORY_CARE_PROVIDER_SITE_OTHER): Payer: 59 | Admitting: Family Medicine

## 2019-03-21 DIAGNOSIS — Z13228 Encounter for screening for other metabolic disorders: Secondary | ICD-10-CM

## 2019-03-21 DIAGNOSIS — Z1329 Encounter for screening for other suspected endocrine disorder: Secondary | ICD-10-CM

## 2019-03-21 DIAGNOSIS — F900 Attention-deficit hyperactivity disorder, predominantly inattentive type: Secondary | ICD-10-CM

## 2019-03-21 DIAGNOSIS — Z130 Encounter for screening for diseases of the blood and blood-forming organs and certain disorders involving the immune mechanism: Secondary | ICD-10-CM

## 2019-03-21 DIAGNOSIS — Z13220 Encounter for screening for lipoid disorders: Secondary | ICD-10-CM

## 2019-03-21 DIAGNOSIS — Z5181 Encounter for therapeutic drug level monitoring: Secondary | ICD-10-CM

## 2019-03-22 LAB — LIPID PANEL
Chol/HDL Ratio: 1.8 ratio (ref 0.0–4.4)
Cholesterol, Total: 166 mg/dL (ref 100–199)
HDL: 92 mg/dL (ref >39)
LDL Calculated: 63 mg/dL (ref 0–99)
Triglycerides: 54 mg/dL (ref 0–149)
VLDL Cholesterol Cal: 11 mg/dL (ref 5–40)

## 2019-03-22 LAB — COMPREHENSIVE METABOLIC PANEL
ALT: 34 IU/L — ABNORMAL HIGH (ref 0–32)
AST: 31 IU/L (ref 0–40)
Albumin/Globulin Ratio: 2.1 (ref 1.2–2.2)
Albumin: 4.4 g/dL (ref 3.8–4.9)
Alkaline Phosphatase: 89 IU/L (ref 39–117)
BUN/Creatinine Ratio: 19 (ref 9–23)
BUN: 11 mg/dL (ref 6–24)
Bilirubin Total: 0.5 mg/dL (ref 0.0–1.2)
CO2: 23 mmol/L (ref 20–29)
Calcium: 9.1 mg/dL (ref 8.7–10.2)
Chloride: 101 mmol/L (ref 96–106)
Creatinine, Ser: 0.57 mg/dL (ref 0.57–1.00)
GFR calc Af Amer: 123 mL/min/{1.73_m2} (ref >59)
GFR calc non Af Amer: 107 mL/min/{1.73_m2} (ref >59)
Globulin, Total: 2.1 g/dL (ref 1.5–4.5)
Glucose: 108 mg/dL — ABNORMAL HIGH (ref 65–99)
Potassium: 4.2 mmol/L (ref 3.5–5.2)
Sodium: 139 mmol/L (ref 134–144)
Total Protein: 6.5 g/dL (ref 6.0–8.5)

## 2019-03-22 LAB — CBC
Hematocrit: 39.7 % (ref 34.0–46.6)
Hemoglobin: 13.9 g/dL (ref 11.1–15.9)
MCH: 33.6 pg — ABNORMAL HIGH (ref 26.6–33.0)
MCHC: 35 g/dL (ref 31.5–35.7)
MCV: 96 fL (ref 79–97)
Platelets: 255 10*3/uL (ref 150–450)
RBC: 4.14 x10E6/uL (ref 3.77–5.28)
RDW: 12.5 % (ref 11.7–15.4)
WBC: 7.1 10*3/uL (ref 3.4–10.8)

## 2019-03-22 LAB — TSH: TSH: 1.01 u[IU]/mL (ref 0.450–4.500)

## 2019-03-23 LAB — TOXASSURE SELECT 13 (MW), URINE

## 2019-03-29 NOTE — Progress Notes (Signed)
Lab visit only. 

## 2019-06-21 ENCOUNTER — Other Ambulatory Visit: Payer: Self-pay | Admitting: Family Medicine

## 2019-06-21 ENCOUNTER — Telehealth: Payer: Self-pay | Admitting: Family Medicine

## 2019-06-21 DIAGNOSIS — F332 Major depressive disorder, recurrent severe without psychotic features: Secondary | ICD-10-CM

## 2019-06-21 MED ORDER — DULOXETINE HCL 30 MG PO CPEP: 90.0000 mg | ORAL_CAPSULE | Freq: Every day | ORAL | 0 refills | Status: DC

## 2019-06-21 NOTE — Telephone Encounter (Signed)
DULoxetine (CYMBALTA) 30 MG capsule amphetamine-dextroamphetamine (ADDERALL XR) 20 MG 24 hr capsule    Patient is requesting refills.    Pharmacy: [facility] # [address] [place]

## 2019-06-21 NOTE — Telephone Encounter (Signed)
Requested medication (s) are due for refill today: yes  Requested medication (s) are on the active medication list:yes  Last refill: 03/17/2019   #60  0 refills  Future visit scheduled no  Notes to clinic:Not deleagted  Requested Prescriptions  Pending Prescriptions Disp Refills   amphetamine-dextroamphetamine (ADDERALL XR) 20 MG 24 hr capsule 60 capsule 0    Sig: Take 1 capsule (20 mg total) by mouth 2 (two) times daily.     Not Delegated - Psychiatry:  Stimulants/ADHD Failed - 06/21/2019  4:14 PM      Failed - This refill cannot be delegated      Failed - Urine Drug Screen completed in last 360 days.      Failed - Valid encounter within last 3 months    Recent Outpatient Visits          3 months ago Attention deficit hyperactivity disorder (ADHD), predominantly inattentive type   [facility], Lilia Argue, MD   3 months ago Attention deficit hyperactivity disorder (ADHD), predominantly inattentive type   [facility], Lilia Argue, MD   5 months ago Attention deficit hyperactivity disorder (ADHD), predominantly inattentive type   [facility], Lilia Argue, MD   7 months ago Attention deficit hyperactivity disorder (ADHD), predominantly inattentive type   [facility] at [facility], [place], MD   11 months ago Attention deficit hyperactivity disorder (ADHD), predominantly inattentive type   [facility], Lilia Argue, MD             Signed Prescriptions Disp Refills   DULoxetine (CYMBALTA) 30 MG capsule 270 capsule 0    Sig: Take 3 capsules (90 mg total) by mouth daily.     Psychiatry: Antidepressants - SNRI Failed - 06/21/2019  4:14 PM      Failed - Last BP in normal range    BP Readings from Last 1 Encounters:  11/08/18 (!) 146/86         Passed - Valid encounter within last 6 months    Recent Outpatient Visits          3 months ago Attention deficit hyperactivity disorder (ADHD), predominantly  inattentive type   [facility], Lilia Argue, MD   3 months ago Attention deficit hyperactivity disorder (ADHD), predominantly inattentive type   [facility], Lilia Argue, MD   5 months ago Attention deficit hyperactivity disorder (ADHD), predominantly inattentive type   [facility], Lilia Argue, MD   7 months ago Attention deficit hyperactivity disorder (ADHD), predominantly inattentive type   [facility] at [facility], [place], MD   11 months ago Attention deficit hyperactivity disorder (ADHD), predominantly inattentive type   [facility], Lilia Argue, MD             Passed - Completed PHQ-2 or PHQ-9 in the last 360 days.

## 2019-06-21 NOTE — Telephone Encounter (Signed)
Pt is needing copy of lab results from physical done in June .need. does not have access to MyChart right now / address is [place] [address]

## 2019-06-22 ENCOUNTER — Telehealth: Payer: Self-pay

## 2019-06-22 MED ORDER — AMPHETAMINE-DEXTROAMPHET ER 20 MG PO CP24: 20.0000 mg | ORAL_CAPSULE | Freq: Two times a day (BID) | ORAL | 0 refills | Status: DC

## 2019-06-22 NOTE — Telephone Encounter (Signed)
rx faxed to the pharmacy for adderall

## 2019-06-22 NOTE — Telephone Encounter (Signed)
pmp reviewd, appropriate meds refilled 

## 2019-06-22 NOTE — Telephone Encounter (Signed)
Requested Prescriptions   Pending Prescriptions Disp Refills  . amphetamine-dextroamphetamine (ADDERALL XR) 20 MG 24 hr capsule 60 capsule 0    Sig: Take 1 capsule (20 mg total) by mouth 2 (two) times daily.  Marland Kitchen amphetamine-dextroamphetamine (ADDERALL XR) 20 MG 24 hr capsule 60 capsule 0    Sig: Take 1 capsule (20 mg total) by mouth 2 (two) times daily.  Marland Kitchen amphetamine-dextroamphetamine (ADDERALL XR) 20 MG 24 hr capsule 60 capsule 0    Sig: Take 1 capsule (20 mg total) by mouth 2 (two) times daily.   Signed Prescriptions Disp Refills  . DULoxetine (CYMBALTA) 30 MG capsule 270 capsule 0    Sig: Take 3 capsules (90 mg total) by mouth daily.    Authorizing Provider: Pamella Pert, [place] M    Ordering User: Elliot Cousin    Last OV 03/21/2019  Last written

## 2019-06-26 ENCOUNTER — Telehealth: Payer: Self-pay

## 2019-06-26 NOTE — Telephone Encounter (Signed)
Calling pt to let her know that her rx will bel ocated at the front office for p/u

## 2019-06-27 NOTE — Telephone Encounter (Signed)
Results printed and mail to home address today.

## 2019-08-07 ENCOUNTER — Other Ambulatory Visit: Payer: Self-pay | Admitting: Family Medicine

## 2019-08-07 DIAGNOSIS — F900 Attention-deficit hyperactivity disorder, predominantly inattentive type: Secondary | ICD-10-CM

## 2019-08-07 DIAGNOSIS — F332 Major depressive disorder, recurrent severe without psychotic features: Secondary | ICD-10-CM

## 2019-08-07 MED ORDER — DULOXETINE HCL 30 MG PO CPEP: 90.0000 mg | ORAL_CAPSULE | Freq: Every day | ORAL | 0 refills | Status: DC

## 2019-08-07 MED ORDER — HYDROXYZINE HCL 25 MG PO TABS: ORAL_TABLET | ORAL | 0 refills | Status: DC
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
## 2019-08-07 NOTE — Telephone Encounter (Signed)
Requested medication (s) are due for refill today:yes  Requested medication (s) are on the active medication list: yes  Last refill:06/22/2019  Future visit scheduled no  Notes to clinic:not delegated  Requested Prescriptions  Pending Prescriptions Disp Refills   amphetamine-dextroamphetamine (ADDERALL XR) 20 MG 24 hr capsule 60 capsule 0    Sig: Take 1 capsule (20 mg total) by mouth 2 (two) times daily.     Not Delegated - Psychiatry:  Stimulants/ADHD Failed - 08/07/2019  3:10 PM      Failed - This refill cannot be delegated      Failed - Urine Drug Screen completed in last 360 days.      Failed - Valid encounter within last 3 months    Recent Outpatient Visits          4 months ago Attention deficit hyperactivity disorder (ADHD), predominantly inattentive type   [facility], Lilia Argue, MD   4 months ago Attention deficit hyperactivity disorder (ADHD), predominantly inattentive type   [facility], Lilia Argue, MD   6 months ago Attention deficit hyperactivity disorder (ADHD), predominantly inattentive type   [facility], Lilia Argue, MD   9 months ago Attention deficit hyperactivity disorder (ADHD), predominantly inattentive type   Primary [facility] at [facility], [place], MD   1 year ago Attention deficit hyperactivity disorder (ADHD), predominantly inattentive type   [facility], Lilia Argue, MD             Signed Prescriptions Disp Refills   DULoxetine (CYMBALTA) 30 MG capsule 90 capsule 0    Sig: Take 3 capsules (90 mg total) by mouth daily.     Psychiatry: Antidepressants - SNRI Failed - 08/07/2019  3:10 PM      Failed - Last BP in normal range    BP Readings from Last 1 Encounters:  11/08/18 (!) 146/86         Passed - Valid encounter within last 6 months    Recent Outpatient Visits          4 months ago Attention deficit hyperactivity disorder (ADHD), predominantly inattentive type   [facility], Lilia Argue, MD   4 months ago Attention deficit hyperactivity disorder (ADHD), predominantly inattentive type   [facility], Lilia Argue, MD   6 months ago Attention deficit hyperactivity disorder (ADHD), predominantly inattentive type   [facility], Lilia Argue, MD   9 months ago Attention deficit hyperactivity disorder (ADHD), predominantly inattentive type   Primary Care at [facility], Ines Bloomer, MD   1 year ago Attention deficit hyperactivity disorder (ADHD), predominantly inattentive type   [facility], Lilia Argue, MD             Passed - Completed PHQ-2 or PHQ-9 in the last 360 days.       hydrOXYzine (ATARAX/VISTARIL) 25 MG tablet 60 tablet 0    Sig: TAKE 1 TABLET BY MOUTH AT BEDTIME AS NEEDED FOR ANXIETY     Ear, Nose, and Throat:  Antihistamines Passed - 08/07/2019  3:10 PM      Passed - Valid encounter within last 12 months    Recent Outpatient Visits          4 months ago Attention deficit hyperactivity disorder (ADHD), predominantly inattentive type   [facility], Lilia Argue, MD   4 months ago Attention deficit hyperactivity  disorder (ADHD), predominantly inattentive type   [facility], Lilia Argue, MD   6 months ago Attention deficit hyperactivity disorder (ADHD), predominantly inattentive type   [facility], Lilia Argue, MD   9 months ago Attention deficit hyperactivity disorder (ADHD), predominantly inattentive type   [facility] at [facility], Ines Bloomer, MD   1 year ago Attention deficit hyperactivity disorder (ADHD), predominantly inattentive type   [facility], Lilia Argue, MD

## 2019-08-08 MED ORDER — AMPHETAMINE-DEXTROAMPHET ER 20 MG PO CP24: 20.0000 mg | ORAL_CAPSULE | Freq: Two times a day (BID) | ORAL | 0 refills | Status: DC

## 2019-08-08 NOTE — Telephone Encounter (Signed)
pmp reviewed  Med refilled 

## 2019-08-11 ENCOUNTER — Telehealth: Payer: Self-pay | Admitting: Family Medicine

## 2019-08-11 DIAGNOSIS — F332 Major depressive disorder, recurrent severe without psychotic features: Secondary | ICD-10-CM

## 2019-08-11 MED ORDER — DULOXETINE HCL 30 MG PO CPEP: 90.0000 mg | ORAL_CAPSULE | Freq: Every day | ORAL | 0 refills | Status: DC

## 2019-08-11 NOTE — Telephone Encounter (Signed)
Copied from [place] [id number]. Topic: Quick Communication - Rx Refill/Question >> Aug 11, 2019  1:53 PM Carolyn Stare wrote: Medication DULoxetine (CYMBALTA) 30 MG capsule   saw the script was printed but not sure if it was ever sent tot the pharmacy    Preferred Pharmacy [facility]   Agent: Please be advised that RX refills may take up to 3 business days. We ask that you follow-up with your pharmacy.

## 2019-08-11 NOTE — Telephone Encounter (Signed)
rx sent to pharmacy
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
## 2019-08-11 NOTE — Telephone Encounter (Signed)
08/11/2019 - THI
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
## 2019-08-14 ENCOUNTER — Other Ambulatory Visit: Payer: Self-pay | Admitting: Family Medicine

## 2019-08-14 NOTE — Telephone Encounter (Signed)
Pt request refill doxycycline (VIBRAMYCIN) 50 MG capsule   [name] [address], [place] [id number] (Phone) [phone] (Fax)

## 2019-08-14 NOTE — Telephone Encounter (Signed)
Doxycycline 50 mg not assigned to a protocol to refill. Has to be reviewed manually.

## 2019-08-16 ENCOUNTER — Other Ambulatory Visit: Payer: Self-pay

## 2019-08-16 DIAGNOSIS — Z20822 Contact with and (suspected) exposure to covid-19: Secondary | ICD-10-CM

## 2019-08-18 ENCOUNTER — Telehealth: Payer: Self-pay | Admitting: Family Medicine

## 2019-08-18 LAB — NOVEL CORONAVIRUS, NAA: SARS-CoV-2, NAA: NOT DETECTED

## 2019-08-18 NOTE — Telephone Encounter (Signed)
° °  Pt rec neg COVID results °

## 2019-09-11 ENCOUNTER — Telehealth: Payer: Self-pay | Admitting: Family Medicine

## 2019-09-11 NOTE — Telephone Encounter (Signed)
Medication: traZODone (DESYREL) 50 MG tablet [id number] , Estradiol (ELESTRIN) 0.52 MG/0.87 GM (0.06%) GEL [id number] , amphetamine-dextroamphetamine (ADDERALL XR) 20 MG 24 hr capsule [id number] - Patient does not have insurance and reports that she will not be able to come in for an ov until then.  Has the patient contacted their pharmacy? Yes  (Agent: If no, request that the patient contact the pharmacy for the refill.) (Agent: If yes, when and what did the pharmacy advise?)  Preferred Pharmacy (with phone number or street name): [facility] # [place], [place]  Phone:  [phone] Fax:  [phone]     Agent: Please be advised that RX refills may take up to 3 business days. We ask that you follow-up with your pharmacy.

## 2019-09-14 NOTE — Telephone Encounter (Signed)
Needs to be seen, these are controlled substance. No refills will be given.

## 2019-09-18 NOTE — Telephone Encounter (Signed)
Lvm to call back and schedule

## 2019-09-19 ENCOUNTER — Telehealth: Payer: Self-pay | Admitting: Family Medicine

## 2019-09-19 ENCOUNTER — Telehealth (INDEPENDENT_AMBULATORY_CARE_PROVIDER_SITE_OTHER): Payer: Self-pay | Admitting: Family Medicine

## 2019-09-19 ENCOUNTER — Other Ambulatory Visit: Payer: Self-pay

## 2019-09-19 DIAGNOSIS — F5104 Psychophysiologic insomnia: Secondary | ICD-10-CM

## 2019-09-19 DIAGNOSIS — F3341 Major depressive disorder, recurrent, in partial remission: Secondary | ICD-10-CM

## 2019-09-19 DIAGNOSIS — Z79890 Hormone replacement therapy (postmenopausal): Secondary | ICD-10-CM

## 2019-09-19 DIAGNOSIS — F900 Attention-deficit hyperactivity disorder, predominantly inattentive type: Secondary | ICD-10-CM

## 2019-09-19 MED ORDER — AMPHETAMINE-DEXTROAMPHET ER 20 MG PO CP24: 20.0000 mg | ORAL_CAPSULE | Freq: Two times a day (BID) | ORAL | 0 refills | Status: DC

## 2019-09-19 MED ORDER — HYDROXYZINE HCL 25 MG PO TABS: ORAL_TABLET | ORAL | 3 refills | Status: DC

## 2019-09-19 MED ORDER — CYCLOBENZAPRINE HCL 10 MG PO TABS: 10.0000 mg | ORAL_TABLET | Freq: Three times a day (TID) | ORAL | 1 refills | Status: DC | PRN

## 2019-09-19 MED ORDER — ELESTRIN 0.52 MG/0.87 GM (0.06%) TD GEL: TRANSDERMAL | 1 refills | Status: DC

## 2019-09-19 MED ORDER — TRAZODONE HCL 50 MG PO TABS: 50.0000 mg | ORAL_TABLET | Freq: Every evening | ORAL | 1 refills | Status: DC | PRN
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
## 2019-09-19 NOTE — Telephone Encounter (Signed)
Estradiol (ELESTRIN) 0.52 MG/0.87 GM (0.06%) GEL     Patient states this needs to be corrected as "1 pump a day" rather than the 2 it is written for. Patient states otherwise this medication is too expensive.

## 2019-09-19 NOTE — Telephone Encounter (Signed)
Rx was sent  

## 2019-10-24 ENCOUNTER — Telehealth: Payer: Self-pay | Admitting: Family Medicine

## 2019-10-24 ENCOUNTER — Telehealth: Payer: Self-pay

## 2019-10-24 NOTE — Telephone Encounter (Signed)
Pt is requesting for pcp to call pharmacy to release the adderall 2 days early as written. Pharmacy is not willing to release until verbal is given. Per pcp, pt will have to wait for the medication to be released by pharmacy. Pt is aware.

## 2019-10-24 NOTE — Telephone Encounter (Signed)
Liza calling from [employer] is calling regarding amphetamine-dextroamphetamine (ADDERALL XR) 20 MG 24 hr capsule [id number] has questions about the medication being filled early. The script was filled 5-6 day early in January. Patient is wanting the medication to be filled earlier Cb- [phone]

## 2019-10-24 NOTE — Telephone Encounter (Signed)
Please Advise

## 2019-10-26 NOTE — Telephone Encounter (Signed)
PMP not working. Decline early refill. Thanks

## 2019-10-26 NOTE — Telephone Encounter (Signed)
Spoke with pt and she stated that the estradiol was costing $200 and wanted to know if you could send in an alternative for that medication but if not, she will settle for the pill even tho it is not as affective

## 2019-10-27 MED ORDER — ESTRADIOL 0.5 MG PO TABS: 0.5000 mg | ORAL_TABLET | Freq: Every day | ORAL | 5 refills | Status: DC

## 2019-12-04 ENCOUNTER — Telehealth: Payer: Self-pay | Admitting: Family Medicine

## 2019-12-04 DIAGNOSIS — F900 Attention-deficit hyperactivity disorder, predominantly inattentive type: Secondary | ICD-10-CM

## 2019-12-04 NOTE — Telephone Encounter (Signed)
hydrOXYzine (ATARAX/VISTARIL) 25 MG tablet amphetamine-dextroamphetamine (ADDERALL XR) 20 MG 24 hr capsule    Patient would like paper RX.

## 2019-12-08 NOTE — Telephone Encounter (Signed)
Please advise on med refill request. Pt will like to have a paper RX.  Also pt was last seen as a tele-med on 09/19/19 and doesn't have a f/u appt scheduled.

## 2019-12-12 ENCOUNTER — Telehealth: Payer: Self-pay

## 2019-12-12 MED ORDER — HYDROXYZINE HCL 25 MG PO TABS: ORAL_TABLET | ORAL | 3 refills | Status: DC

## 2019-12-12 MED ORDER — AMPHETAMINE-DEXTROAMPHET ER 20 MG PO CP24: 20.0000 mg | ORAL_CAPSULE | Freq: Two times a day (BID) | ORAL | 0 refills | Status: DC

## 2019-12-12 NOTE — Telephone Encounter (Signed)
Called pt. Lm rx is at front office ready for pick up

## 2019-12-12 NOTE — Telephone Encounter (Signed)
Pmp reviewed Meds refilled, printed rx ready for pickup

## 2019-12-23 ENCOUNTER — Other Ambulatory Visit: Payer: Self-pay | Admitting: Family Medicine

## 2019-12-23 NOTE — Telephone Encounter (Signed)
No protocol assigned- not active med-routing To PCP

## 2020-01-17 ENCOUNTER — Other Ambulatory Visit: Payer: Self-pay | Admitting: Family Medicine

## 2020-01-17 DIAGNOSIS — F332 Major depressive disorder, recurrent severe without psychotic features: Secondary | ICD-10-CM

## 2020-01-17 NOTE — Telephone Encounter (Signed)
Patient is requesting a refill of the following medications: Requested Prescriptions   Pending Prescriptions Disp Refills  . DULoxetine (CYMBALTA) 30 MG capsule [Pharmacy Med Name: DULoxetine HCl Oral Capsule Delayed Release Particles 30 MG] 90 capsule 0    Sig: TAKE 3 CAPSULES BY MOUTH DAILY    Date of patient request: 4/28/52021 Last office visit: 09/19/2019 Date of last refill: 08/11/2019 Last refill amount: 90 capsules  Follow up time period per chart: N/A

## 2020-01-17 NOTE — Telephone Encounter (Signed)
Requested medication (s) are due for refill today - medication not on current medication list  Requested medication (s) are on the active medication list -no  Future visit scheduled -no  Last refill: 11/02/19  Notes to clinic: Request for discontinued medication- not current on list  Requested Prescriptions  Pending Prescriptions Disp Refills   DULoxetine (CYMBALTA) 30 MG capsule [Pharmacy Med Name: DULoxetine HCl Oral Capsule Delayed Release Particles 30 MG] 90 capsule 0    Sig: TAKE 3 CAPSULES BY MOUTH DAILY      Psychiatry: Antidepressants - SNRI Failed - 01/17/2020 11:40 AM      Failed - Last BP in normal range    BP Readings from Last 1 Encounters:  11/08/18 (!) 146/86          Passed - Completed PHQ-2 or PHQ-9 in the last 360 days.      Passed - Valid encounter within last 6 months    Recent Outpatient Visits           4 months ago Attention deficit hyperactivity disorder (ADHD), predominantly inattentive type   [facility], Lilia Argue, MD   10 months ago Attention deficit hyperactivity disorder (ADHD), predominantly inattentive type   [facility], Lilia Argue, MD   10 months ago Attention deficit hyperactivity disorder (ADHD), predominantly inattentive type   [facility], Lilia Argue, MD   1 year ago Attention deficit hyperactivity disorder (ADHD), predominantly inattentive type   [facility], Lilia Argue, MD   1 year ago Attention deficit hyperactivity disorder (ADHD), predominantly inattentive type   Primary Care at [facility], [place], MD                  Requested Prescriptions  Pending Prescriptions Disp Refills   DULoxetine (CYMBALTA) 30 MG capsule [Pharmacy Med Name: DULoxetine HCl Oral Capsule Delayed Release Particles 30 MG] 90 capsule 0    Sig: TAKE 3 CAPSULES BY MOUTH DAILY      Psychiatry: Antidepressants - SNRI Failed - 01/17/2020 11:40 AM      Failed - Last BP in normal  range    BP Readings from Last 1 Encounters:  11/08/18 (!) 146/86          Passed - Completed PHQ-2 or PHQ-9 in the last 360 days.      Passed - Valid encounter within last 6 months    Recent Outpatient Visits           4 months ago Attention deficit hyperactivity disorder (ADHD), predominantly inattentive type   [facility], Lilia Argue, MD   10 months ago Attention deficit hyperactivity disorder (ADHD), predominantly inattentive type   [facility], Lilia Argue, MD   10 months ago Attention deficit hyperactivity disorder (ADHD), predominantly inattentive type   [facility], Lilia Argue, MD   1 year ago Attention deficit hyperactivity disorder (ADHD), predominantly inattentive type   [facility], Lilia Argue, MD   1 year ago Attention deficit hyperactivity disorder (ADHD), predominantly inattentive type   Primary Care at [facility], Ines Bloomer, MD

## 2020-03-01 ENCOUNTER — Other Ambulatory Visit: Payer: Self-pay

## 2020-03-01 DIAGNOSIS — F900 Attention-deficit hyperactivity disorder, predominantly inattentive type: Secondary | ICD-10-CM

## 2020-03-01 MED ORDER — AMPHETAMINE-DEXTROAMPHET ER 20 MG PO CP24: 20.0000 mg | ORAL_CAPSULE | Freq: Two times a day (BID) | ORAL | 0 refills | Status: DC

## 2020-03-01 NOTE — Telephone Encounter (Signed)
pmp reviewed  Med refilled 

## 2020-03-01 NOTE — Telephone Encounter (Signed)
°  ° ° °  Copied from [place] [id number]. Topic: General - Other >> Mar 01, 2020  3:00 PM Leward Quan A wrote: Reason for CRM: Patient called to request that her Rxes for amphetamine-dextroamphetamine (ADDERALL XR) 20 MG 24 hr capsule to be sent to her by mail or sent to the [place], [place]  Phone:  [phone] Fax:  [phone] [employer]. Please advise

## 2020-03-04 NOTE — Telephone Encounter (Signed)
Called pt LVM for her to call us back to schedule appt for July

## 2020-03-29 ENCOUNTER — Encounter: Payer: Self-pay | Admitting: Family Medicine

## 2020-03-29 ENCOUNTER — Ambulatory Visit (INDEPENDENT_AMBULATORY_CARE_PROVIDER_SITE_OTHER): Payer: Self-pay | Admitting: Family Medicine

## 2020-03-29 ENCOUNTER — Ambulatory Visit: Payer: Self-pay | Admitting: Family Medicine

## 2020-03-29 ENCOUNTER — Other Ambulatory Visit: Payer: Self-pay

## 2020-03-29 DIAGNOSIS — F900 Attention-deficit hyperactivity disorder, predominantly inattentive type: Secondary | ICD-10-CM

## 2020-03-29 MED ORDER — AMPHETAMINE-DEXTROAMPHET ER 20 MG PO CP24: 20.0000 mg | ORAL_CAPSULE | Freq: Two times a day (BID) | ORAL | 0 refills | Status: DC

## 2020-03-29 NOTE — Patient Instructions (Signed)
° ° ° °  If you have lab work done today you will be contacted with your lab results within the next 2 weeks.  If you have not heard from us then please contact us. The fastest way to get your results is to register for My Chart. ° ° °IF you received an x-ray today, you will receive an invoice from [facility]. Please contact [facility] at [phone] with questions or concerns regarding your invoice.  ° °IF you received labwork today, you will receive an invoice from [facility]. Please contact [facility] at [phone] with questions or concerns regarding your invoice.  ° °Our billing staff will not be able to assist you with questions regarding bills from these companies. ° °You will be contacted with the lab results as soon as they are available. The fastest way to get your results is to activate your My Chart account. Instructions are located on the last page of this paperwork. If you have not heard from us regarding the results in 2 weeks, please contact this office. °  ° ° ° °

## 2020-03-29 NOTE — Progress Notes (Signed)
7/9/20212:34 PM  Crystal Williamson 01-18-1966, 54 y.o., female [id number]  Chief Complaint  Patient presents with  . ADHD    f/u with refill    HPI:   Patient is a 54 y.o. 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 She reports that her ADD is doing well, 20mg  BID She has been stable on this dose for years She reports BID dosing allows her to have smooth improved focus and concentration throughout the day wo any changes in mood She denies any increase BP, palpitations, CP, insomnia, decreased appetite  She has varicose veins, wears compression stockings Once she has insurance she would like to go back to see Dr Aleda Grana at [facility]   Depression screen [facility] 2/9 03/29/2020 09/19/2019 01/16/2019  Decreased Interest 0 0 2  Down, Depressed, Hopeless 0 1 2  PHQ - 2 Score 0 1 4  Altered sleeping - 3 1  Tired, decreased energy - 3 3  Change in appetite - 1 2  Feeling bad or failure about yourself  - 2 3  Trouble concentrating - - 1  Moving slowly or fidgety/restless - 0 1  Suicidal thoughts - 0 -  PHQ-9 Score - 10 15  Difficult doing work/chores - - -  Some recent data might be hidden    Fall Risk  03/29/2020 09/19/2019 03/17/2019 01/16/2019 11/08/2018  Falls in the past year? 0 0 0 0 0  Number falls in past yr: 0 0 0 0 -  Injury with Fall? 0 0 0 0 -  Follow up Falls evaluation completed - - - -     Allergies  Allergen Reactions  . Codeine Itching    REACTION: N \\T \ V  . Hydromorphone Itching  . Opium   . Percocet [Oxycodone-Acetaminophen] Itching    Prior to Admission medications   Medication Sig Start Date End Date Taking? Authorizing Provider    amphetamine-dextroamphetamine (ADDERALL XR) 20 MG 24 hr capsule Take 1 capsule (20 mg total) by mouth 2 (two) times daily. 12/12/19  Yes Rutherford Guys, MD  amphetamine-dextroamphetamine (ADDERALL XR) 20 MG 24 hr capsule Take 1 capsule (20 mg total) by mouth 2 (two) times daily. 12/12/19  Yes Rutherford Guys, MD  amphetamine-dextroamphetamine (ADDERALL XR) 20 MG 24 hr capsule Take 1 capsule (20 mg total) by mouth 2 (two) times daily. Needs in person office visit for future refills. 03/01/20  Yes Rutherford Guys, MD  cyclobenzaprine (FLEXERIL) 10 MG tablet Take 1 tablet (10 mg total) by mouth 3 (three) times daily as needed for muscle spasms. 09/19/19  Yes Rutherford Guys, MD  DULoxetine (CYMBALTA) 30 MG capsule TAKE 3 CAPSULES BY MOUTH DAILY 01/18/20  Yes Rutherford Guys, MD  estradiol (ESTRACE) 0.5 MG tablet Take 1 tablet (0.5 mg total) by mouth daily. 10/27/19  Yes Rutherford Guys, MD  hydrOXYzine (ATARAX/VISTARIL) 25 MG tablet TAKE 1 TABLET BY MOUTH AT BEDTIME AS NEEDED FOR ANXIETY 12/12/19  Yes Rutherford Guys, MD  traZODone (DESYREL) 50 MG tablet Take 1-2 tablets (50-100 mg total) by mouth at bedtime as needed. 09/19/19  Yes Rutherford Guys, MD  Past Medical History:  Diagnosis Date  . Allergy   . Depression   . Fibroid   . Headache(784.0)   . No pertinent past medical history   . Varicose veins    bilateral    Past Surgical History:  Procedure Laterality Date  . ABDOMINAL HYSTERECTOMY  06/01/2012   Procedure: HYSTERECTOMY ABDOMINAL;  Surgeon: Terrance Mass, MD;  Location: [facility];  Service: Gynecology;  Laterality: N/A;  . SEPTOPLASTY    . VARICOSE VEIN SURGERY    . WISDOM TOOTH EXTRACTION      Social History   Tobacco Use  . Smoking status: Current Some Day Smoker    Packs/day: 0.25    Types: Cigarettes  . Smokeless tobacco: Never Used  . Tobacco comment: 5 cig a day  Substance Use Topics  . Alcohol use: Yes    Alcohol/week: 0.0 standard drinks    Comment: 3-4x  week     Family History  Problem Relation Age of Onset  . Colon cancer Paternal Grandfather   . Diabetes Paternal Grandfather        type 2   . Cancer Paternal Grandfather        COLON  . Breast cancer Paternal Grandmother   . Stroke Maternal Grandmother   . Lupus Mother   . Lumbar disc disease Mother   . Varicose Veins Mother   . Cancer Maternal Grandfather   . Diabetes Maternal Grandfather     ROS  Per hpi  OBJECTIVE:  Today's Vitals   03/29/20 1412  BP: 133/71  Pulse: 75  Temp: 98 F (36.7 C)  TempSrc: Temporal  SpO2: 98%  Weight: 222 lb (100.7 kg)  Height: 5\' 8"  (1.727 m)   Body mass index is 33.75 kg/m.   Wt Readings from Last 3 Encounters:  03/29/20 222 lb (100.7 kg)  11/08/18 186 lb (84.4 kg)  06/28/18 184 lb 3.2 oz (83.6 kg)   Physical Exam Vitals and nursing note reviewed.  Constitutional:      Appearance: She is well-developed.  HENT:     Head: Normocephalic and atraumatic.     Mouth/Throat:     Pharynx: No oropharyngeal exudate.  Eyes:     General: No scleral icterus.    Conjunctiva/sclera: Conjunctivae normal.     Pupils: Pupils are equal, round, and reactive to light.  Cardiovascular:     Rate and Rhythm: Normal rate and regular rhythm.     Heart sounds: Normal heart sounds. No murmur heard.  No friction rub. No gallop.   Pulmonary:     Effort: Pulmonary effort is normal.     Breath sounds: Normal breath sounds. No wheezing or rales.  Musculoskeletal:     Cervical back: Neck supple.  Skin:    General: Skin is warm and dry.  Neurological:     Mental Status: She is alert and oriented to person, place, and time.     No results found for this or any previous visit (from the past 24 hour(s)).  No results found.   ASSESSMENT and PLAN  1. Attention deficit hyperactivity disorder (ADHD), predominantly inattentive type Controlled. Continue current regime. pmp reviewed. Hold off on UDS until insurance coverage obtained -  amphetamine-dextroamphetamine (ADDERALL XR) 20 MG 24 hr capsule; Take 1 capsule (20 mg total) by mouth 2 (two) times daily. Needs in person office visit for future refills.  Other orders - amphetamine-dextroamphetamine (ADDERALL XR) 20 MG 24 hr capsule; Take 1 capsule (20 mg total) by mouth 2 (  two) times daily. - amphetamine-dextroamphetamine (ADDERALL XR) 20 MG 24 hr capsule; Take 1 capsule (20 mg total) by mouth 2 (two) times daily.  Return in about 3 months (around 06/29/2020) for CPE.    Rutherford Guys, MD Primary [facility] at [place] [address], [place] [address] Ph.  [phone] Fax [phone]

## 2020-04-02 ENCOUNTER — Ambulatory Visit: Payer: Self-pay | Admitting: Family Medicine

## 2020-06-14 ENCOUNTER — Telehealth: Payer: Self-pay | Admitting: *Deleted

## 2020-06-14 NOTE — Telephone Encounter (Signed)
Schedule Mammogram Mobile Unit 06-17-2020 Primary Care at [facility]

## 2020-07-05 ENCOUNTER — Telehealth: Payer: Self-pay

## 2020-07-05 DIAGNOSIS — F900 Attention-deficit hyperactivity disorder, predominantly inattentive type: Secondary | ICD-10-CM

## 2020-07-05 MED ORDER — AMPHETAMINE-DEXTROAMPHET ER 20 MG PO CP24: 20.0000 mg | ORAL_CAPSULE | Freq: Two times a day (BID) | ORAL | 0 refills | Status: DC

## 2020-07-05 NOTE — Telephone Encounter (Signed)
Pt. Called requesting 1 month supply of Adderall XR,Was a pt. Of Dr. Pamella Pert. Unable to schedule appt. Due to pt.'s lack of insurance

## 2020-07-05 NOTE — Telephone Encounter (Signed)
Patient is requesting a refill of the following medications: Requested Prescriptions    No prescriptions requested or ordered in this encounter    Date of patient request: 07/05/2020 Last office visit: 03/29/2020 Date of last refill: 05/30/2020 Last refill amount: 60 Follow up time period per chart: 3 months, 06/29/2020

## 2020-07-05 NOTE — Telephone Encounter (Signed)
Controlled substance database (PDMP) reviewed. No concerns appreciated. Last filled 06/08/20. 3 month visit planned in July. I can extend prescription once, but what is plan for follow up visit?

## 2020-07-08 ENCOUNTER — Telehealth: Payer: Self-pay | Admitting: Emergency Medicine

## 2020-07-08 NOTE — Telephone Encounter (Signed)
DR Carlota Raspberry, I see you sent in patient Adderall medication on 07/05/20. Patient wanted this medication to go to the [facility] on [address]. I up dated the patients pharmacy into the patient chart. I also called the [name] in [place] to cancel the med we sent there. Can you resent the Adderall to the new updated pharmacy.

## 2020-07-08 NOTE — Telephone Encounter (Signed)
See [employer] message for details

## 2020-07-08 NOTE — Telephone Encounter (Signed)
Can you resend refill from firday as the Rx system was not actually putting rx through at the time. Verified pharmacy does not have her adderall

## 2020-07-08 NOTE — Telephone Encounter (Signed)
Pt is calling back regarding a courtesy refill  request  For her adderal   Patient states that she no longer uses the pharmacy on file   [facility] uses [name] [address] for her refills the phone number is [phone] It is a controlled substance and they wont transfer for her and she needs it  Today

## 2020-07-08 NOTE — Telephone Encounter (Cosign Needed)
Pt is calling back needing this refill

## 2020-07-08 NOTE — Telephone Encounter (Signed)
Patient is calling upset about her medication refill that she is requesting to be transferred today.

## 2020-07-09 MED ORDER — AMPHETAMINE-DEXTROAMPHET ER 20 MG PO CP24: 20.0000 mg | ORAL_CAPSULE | Freq: Two times a day (BID) | ORAL | 0 refills | Status: DC

## 2020-07-09 NOTE — Telephone Encounter (Signed)
See other phone message. Controlled substance database (PDMP) reviewed. Last filled 06/08/20.   Prescription was sent last week, but did not know it needed to go to a different pharmacy.  I did receive the message yesterday, but unfortunately was unable to prescribe controlled medications. I did have IT help me last night and able to send that to her pharmacy today. Please verify that the pharmacy received it and make patient aware. Let me know if there are further questions.

## 2020-07-09 NOTE — Telephone Encounter (Signed)
Informed patient per drs previous notes and refill information.

## 2020-07-09 NOTE — Telephone Encounter (Signed)
Rich this is a Dr Pamella Pert patient. Dr Carlota Raspberry sen this med to the pharmacy for this patient but it was sent to the wrong pharmacy. DR Carlota Raspberry is not in the office today and patient really need this med and stated she has been out since tues. Is is possible you can snd this med in to the pharmacy. The pharmacy has been updated. Here is the below msg to DR Carlota Raspberry.  DR Carlota Raspberry, I see you sent in patient Adderall medication on 07/05/20. Patient wanted this medication to go to the [facility] on [address]. I up dated the patients pharmacy into the patient chart. I also called the [name] in [place] to cancel the med we sent there. Can you resent the Adderall to the new updated pharmacy.

## 2020-07-09 NOTE — Telephone Encounter (Signed)
Please call patient. I was unable to send electronically this weekend, IT made some changes for me last night, Rx sent to her pharmacy today. Apologize for delay. Thanks.

## 2020-07-09 NOTE — Telephone Encounter (Signed)
Pt is crying on the phone, she really needs this filled. Please advise

## 2020-07-31 ENCOUNTER — Encounter: Payer: Self-pay | Admitting: Family Medicine

## 2020-07-31 ENCOUNTER — Other Ambulatory Visit: Payer: Self-pay

## 2020-07-31 ENCOUNTER — Ambulatory Visit (INDEPENDENT_AMBULATORY_CARE_PROVIDER_SITE_OTHER): Payer: Self-pay | Admitting: Family Medicine

## 2020-07-31 VITALS — BP 136/78 | HR 99 | Temp 97.7°F | Ht 68.0 in | Wt 216.0 lb

## 2020-07-31 DIAGNOSIS — F5104 Psychophysiologic insomnia: Secondary | ICD-10-CM

## 2020-07-31 DIAGNOSIS — F332 Major depressive disorder, recurrent severe without psychotic features: Secondary | ICD-10-CM

## 2020-07-31 DIAGNOSIS — F900 Attention-deficit hyperactivity disorder, predominantly inattentive type: Secondary | ICD-10-CM

## 2020-07-31 DIAGNOSIS — Z79890 Hormone replacement therapy (postmenopausal): Secondary | ICD-10-CM

## 2020-07-31 MED ORDER — DULOXETINE HCL 30 MG PO CPEP: 90.0000 mg | ORAL_CAPSULE | Freq: Every day | ORAL | 5 refills | Status: DC

## 2020-07-31 MED ORDER — ESTRADIOL 1 MG PO TABS: 1.0000 mg | ORAL_TABLET | Freq: Every day | ORAL | 1 refills | Status: DC

## 2020-07-31 MED ORDER — HYDROXYZINE HCL 25 MG PO TABS: ORAL_TABLET | ORAL | 3 refills | Status: DC

## 2020-07-31 MED ORDER — AMPHETAMINE-DEXTROAMPHET ER 20 MG PO CP24: 20.0000 mg | ORAL_CAPSULE | Freq: Two times a day (BID) | ORAL | 0 refills | Status: DC

## 2020-07-31 MED ORDER — TRAZODONE HCL 50 MG PO TABS: 50.0000 mg | ORAL_TABLET | Freq: Every evening | ORAL | 1 refills | Status: DC | PRN

## 2020-07-31 NOTE — Patient Instructions (Addendum)
Continue same dose for adderall for now.  Try increasing cymbalta to 3 pills per day. trazadone 50mg  1-2 at bedtime and hydroxyzine for now.  Try to drink no more than 1-2 alcoholic drinks per day. Restart estrace at 1mg  per day for menopausal symptoms.   Give me an update on your symptoms and new doses in next 4-6 weeks. In office follow up in 3 months for recheck meds and labs.   Return to the clinic or go to the nearest emergency room if any of your symptoms worsen or new symptoms occur.   If you have lab work done today you will be contacted with your lab results within the next 2 weeks.  If you have not heard from [place] then please contact us. The fastest way to get your results is to register for My Chart.   IF you received an x-ray today, you will receive an invoice from [facility]. Please contact [facility] at [phone] with questions or concerns regarding your invoice.   IF you received labwork today, you will receive an invoice from [facility]. Please contact [facility] at [phone] with questions or concerns regarding your invoice.   Our billing staff will not be able to assist you with questions regarding bills from these companies.  You will be contacted with the lab results as soon as they are available. The fastest way to get your results is to activate your My Chart account. Instructions are located on the last page of this paperwork. If you have not heard from [place] regarding the results in 2 weeks, please contact this office.

## 2020-07-31 NOTE — Progress Notes (Signed)
Subjective:  Patient ID: Crystal Williamson, female    DOB: [birth year]  Age: 54 y.o. MRN: [id number]  CC:  Chief Complaint  Patient presents with  . Medication Refill    on Adderall. Pt reports this medication works well with no side effects. Medication helps the pt focus. Pt states this medication was originaly given to her for depression. PT would perfer paper scrips if possible.    HPI Crystal Williamson presents for  Establish care, med review.  History of ADHD, depression, menopause on HRT. Previous patient of Dr. Pamella Pert, last visit July 9.  ADHD/ADD: Doing well in July, 20 mg twice daily Adderall was working well.  Stable dose for years.  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  Controlled substance database reviewed, last prescription filled October 19, previously September 18, previously August 21. Feels like current dose working ok - no new side effects.  No appetite suppression. Overwhelmed - that impacts appetite.  No palpitations. No insomnia. trazadone and hydroxyzine allow her to remain asleep. Urine drug screen performed March 21, 2019 that was consistent at that time. Sometimes has taken 3 pills of adderall.  Trouble with sleep predated adderall use.   Postmenopausal hot flushes.  On and off for years.  Prior MVI with herbs helped - out of that med for awhile - restarted yesterday. Estrace 0.5mg  tablet - stopped a month ago, had been on a year - not as effective as prior topical treatment. Hot flushes, irritability returned.   Depression: Has been treated with Cymbalta - had decreased to 1 pill, back up to 2 a few months ago, considering going back up to 3 for depression symptoms. trazodone 1/4 pill in evening if difficult day, then 1/2 pill to whole pill.  Waking up some nights.  Taking 1 hydroxyzine at bedtime. This plus trazodone helps to get to sleep and stay asleep.  Alcohol - wine 1-4 drinks per night, sometimes  none.  DUI in college, no recent problems with alcohol.  No marijuana/IDU.   Depression worse  - trouble paying bills, lost insurance about a year half ago.  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 New store past few weeks.  Lack of energy with depression and menopausal symptoms.  No suicidal thoughts.  In open enrollment, not sure if getting insurance.   Depression screen [facility] 2/9 07/31/2020 03/29/2020 09/19/2019 01/16/2019 11/08/2018  Decreased Interest 2 0 0 2 0  Down, Depressed, Hopeless 2 0 1 2 0  PHQ - 2 Score 4 0 1 4 0  Altered sleeping 2 - 3 1 0  Tired, decreased energy 3 - 3 3 0  Change in appetite 2 - 1 2 0  Feeling bad or failure about yourself  1 - 2 3 0  Trouble concentrating 2 - - 1 0  Moving slowly or fidgety/restless 0 - 0 1 0  Suicidal thoughts 0 - 0 - 0  PHQ-9 Score 14 - 10 15 0  Difficult doing work/chores - - - - Not difficult at all  Some recent data might be hidden      History Patient Active Problem List   Diagnosis Date Noted  . Postmenopausal hormone replacement therapy 02/22/2018  . MDD (major depressive disorder), recurrent severe, without psychosis ([place]) 11/09/2017  . Persistent depressive disorder with anxious distress, currently severe   . Overdose 11/06/2017  . Alopecia 07/19/2012  .  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 HEADACHE 08/06/2009  . KERATOSIS PILARIS 06/14/2009  . HYPERGLYCEMIA 02/22/2009  . DERMATOPHYTOSIS OF NAIL 02/14/2009  . OBESITY 02/14/2009  . SEBORRHEIC KERATOSIS 02/14/2009  . LOW BACK PAIN, CHRONIC 02/14/2009  . FATIGUE 02/14/2009  . Attention deficit hyperactivity disorder (ADHD) 02/14/2009   Past Medical History:  Diagnosis Date  . Allergy   . Depression   . Fibroid   . Headache(784.0)   . No pertinent past medical history   . Varicose veins    bilateral   Past Surgical History:  Procedure Laterality Date  . ABDOMINAL HYSTERECTOMY  06/01/2012   Procedure:  HYSTERECTOMY ABDOMINAL;  Surgeon: Terrance Mass, MD;  Location: [facility];  Service: Gynecology;  Laterality: N/A;  . SEPTOPLASTY    . VARICOSE VEIN SURGERY    . WISDOM TOOTH EXTRACTION     Allergies  Allergen Reactions  . Codeine Itching    REACTION: N \\T \ V  . Hydromorphone Itching  . Opium   . Percocet [Oxycodone-Acetaminophen] Itching   Prior to Admission medications   Medication Sig Start Date End Date Taking? Authorizing Provider  amphetamine-dextroamphetamine (ADDERALL XR) 20 MG 24 hr capsule Take 1 capsule (20 mg total) by mouth 2 (two) times daily. 03/29/20  Yes Rutherford Guys, MD  amphetamine-dextroamphetamine (ADDERALL XR) 20 MG 24 hr capsule Take 1 capsule (20 mg total) by mouth 2 (two) times daily. 03/29/20  Yes Rutherford Guys, MD  amphetamine-dextroamphetamine (ADDERALL XR) 20 MG 24 hr capsule Take 1 capsule (20 mg total) by mouth 2 (two) times daily. 07/09/20  Yes Wendie Agreste, MD  cyclobenzaprine (FLEXERIL) 10 MG tablet Take 1 tablet (10 mg total) by mouth 3 (three) times daily as needed for muscle spasms. 09/19/19  Yes Rutherford Guys, MD  DULoxetine (CYMBALTA) 30 MG capsule TAKE 3 CAPSULES BY MOUTH DAILY 01/18/20  Yes Rutherford Guys, MD  hydrOXYzine (ATARAX/VISTARIL) 25 MG tablet TAKE 1 TABLET BY MOUTH AT BEDTIME AS NEEDED FOR ANXIETY 12/12/19  Yes Rutherford Guys, MD  traZODone (DESYREL) 50 MG tablet Take 1-2 tablets (50-100 mg total) by mouth at bedtime as needed. 09/19/19  Yes Rutherford Guys, MD   Social History   Socioeconomic History  . Marital status: Widowed    Spouse name: Not on file  . Number of children: Not on file  . Years of education: Not on file  . Highest education level: Not on file  Occupational History  . Not on file  Tobacco Use  . Smoking status: Current Every Day Smoker    Packs/day: 0.25    Types: Cigarettes  . Smokeless tobacco: Never Used  . Tobacco comment: 5 cig a day  Substance and Sexual Activity  . Alcohol use: Yes     Alcohol/week: 0.0 standard drinks    Comment: 3-4x week   . Drug use: No  . Sexual activity: Not Currently    Birth control/protection: None, Surgical  Other Topics Concern  . Not on file  Social History Narrative   ** Merged History Encounter **       Social Determinants of Health   Financial Resource Strain:   . Difficulty of Paying Living Expenses: Not on file  Food Insecurity:   . Worried About Charity fundraiser in the Last Year: Not on file  . Ran Out of Food in the Last Year: Not on file  Transportation Needs:   . Lack of Transportation (Medical): Not on file  .  Lack of Transportation (Non-Medical): Not on file  Physical Activity:   . Days of Exercise per Week: Not on file  . Minutes of Exercise per Session: Not on file  Stress:   . Feeling of Stress : Not on file  Social Connections:   . Frequency of Communication with Friends and Family: Not on file  . Frequency of Social Gatherings with Friends and Family: Not on file  . Attends Religious Services: Not on file  . Active Member of Clubs or Organizations: Not on file  . Attends Archivist Meetings: Not on file  . Marital Status: Not on file  Intimate Partner Violence:   . Fear of Current or Ex-Partner: Not on file  . Emotionally Abused: Not on file  . Physically Abused: Not on file  . Sexually Abused: Not on file    Review of Systems  Per HPI Objective:   Vitals:   07/31/20 1411 07/31/20 1525  BP: (!) 152/79 136/78  Pulse: 99   Temp: 97.7 F (36.5 C)   TempSrc: Temporal   SpO2: 97%   Weight: 216 lb (98 kg)   Height: 5\' 8"  (1.727 m)     Physical Exam Vitals reviewed.  Constitutional:      Appearance: She is well-developed.  HENT:     Head: Normocephalic and atraumatic.  Eyes:     Conjunctiva/sclera: Conjunctivae normal.     Pupils: Pupils are equal, round, and reactive to light.  Neck:     Vascular: No carotid bruit.  Cardiovascular:     Rate and Rhythm: Normal rate and regular  rhythm.     Heart sounds: Normal heart sounds.  Pulmonary:     Effort: Pulmonary effort is normal.     Breath sounds: Normal breath sounds.  Abdominal:     Palpations: Abdomen is soft. There is no pulsatile mass.     Tenderness: There is no abdominal tenderness.  Skin:    General: Skin is warm and dry.  Neurological:     Mental Status: She is alert and oriented to person, place, and time.  Psychiatric:        Attention and Perception: Attention normal.        Mood and Affect: Mood normal.        Speech: Speech normal.        Behavior: Behavior normal. Behavior is cooperative.        Thought Content: Thought content normal. Thought content does not include suicidal ideation.    47 minutes spent during visit, greater than 50% counseling and assimilation of information, chart review, and discussion of plan.    Assessment & Plan:  Crystal Williamson is a 54 y.o. female . MDD (major depressive disorder), recurrent severe, without psychosis ([place]) - Plan: DULoxetine (CYMBALTA) 30 MG capsule  -Suspect some worsening of depression which may be affecting her other symptoms below.  We will try higher dose of Cymbalta at 90 mg daily which she has taken previously.  Cautioned about signs or symptoms of serotonin syndrome with use of trazodone but has taken combination previously without difficulty.  Update by MyChart in the next 4 to 6 weeks, 67-month in person visit, RTC precautions.  Psychophysiological insomnia - Plan: traZODone (DESYREL) 50 MG tablet, hydrOXYzine (ATARAX/VISTARIL) 25 MG tablet  -Notes insomnia present prior to use of Adderall as concern of extended release dose twice per day was discussed.  Higher dose of Cymbalta as above, continue trazodone 1/2-1 at bedtime, hydroxyzine at bedtime.  Postmenopausal hormone replacement therapy - Plan: estradiol (ESTRACE) 1 MG tablet  -Previous low dose of Estrace may not have been effective, agreed to try 1 mg dosing as that might be more  beneficial.  Previously has discussed risks of hormonal placement therapy.  Attention deficit hyperactivity disorder (ADHD), predominantly inattentive type - Plan: amphetamine-dextroamphetamine (ADDERALL XR) 20 MG 24 hr capsule, amphetamine-dextroamphetamine (ADDERALL XR) 20 MG 24 hr capsule, amphetamine-dextroamphetamine (ADDERALL XR) 20 MG 24 hr capsule  -Reports overall stable symptoms, will continue same dose of Adderall XR.  If needed for more than twice per day dosing, would consider psychiatry eval given other symptoms/meds above.   Meds ordered this encounter  Medications  . estradiol (ESTRACE) 1 MG tablet    Sig: Take 1 tablet (1 mg total) by mouth daily.    Dispense:  30 tablet    Refill:  1  . DULoxetine (CYMBALTA) 30 MG capsule    Sig: Take 3 capsules (90 mg total) by mouth daily.    Dispense:  90 capsule    Refill:  5  . traZODone (DESYREL) 50 MG tablet    Sig: Take 1-2 tablets (50-100 mg total) by mouth at bedtime as needed.    Dispense:  180 tablet    Refill:  1  . hydrOXYzine (ATARAX/VISTARIL) 25 MG tablet    Sig: TAKE 1 TABLET BY MOUTH AT BEDTIME AS NEEDED FOR ANXIETY    Dispense:  60 tablet    Refill:  3  . amphetamine-dextroamphetamine (ADDERALL XR) 20 MG 24 hr capsule    Sig: Take 1 capsule (20 mg total) by mouth 2 (two) times daily.    Dispense:  60 capsule    Refill:  0    Ok to fill 60 days after prior rx. Madaline Brilliant to fill 2 days prior to refill date  . amphetamine-dextroamphetamine (ADDERALL XR) 20 MG 24 hr capsule    Sig: Take 1 capsule (20 mg total) by mouth 2 (two) times daily.    Dispense:  60 capsule    Refill:  0    Ok to fill 30 days after prior rx, and 2 days prior to refill date.  Marland Kitchen amphetamine-dextroamphetamine (ADDERALL XR) 20 MG 24 hr capsule    Sig: Take 1 capsule (20 mg total) by mouth 2 (two) times daily.    Dispense:  60 capsule    Refill:  0    Start 08/07/20   Patient Instructions   Continue same dose for adderall for now.  Try  increasing cymbalta to 3 pills per day. trazadone 50mg  1-2 at bedtime and hydroxyzine for now.  Try to drink no more than 1-2 alcoholic drinks per day. Restart estrace at 1mg  per day for menopausal symptoms.   Give me an update on your symptoms and new doses in next 4-6 weeks. In office follow up in 3 months for recheck meds and labs.   Return to the clinic or go to the nearest emergency room if any of your symptoms worsen or new symptoms occur.   If you have lab work done today you will be contacted with your lab results within the next 2 weeks.  If you have not heard from [place] then please contact us. The fastest way to get your results is to register for My Chart.   IF you received an x-ray today, you will receive an invoice from [facility]. Please contact [facility] at [phone] with questions or concerns regarding your invoice.   IF you received  labwork today, you will receive an invoice from [facility]. Please contact [facility] at [phone] with questions or concerns regarding your invoice.   Our billing staff will not be able to assist you with questions regarding bills from these companies.  You will be contacted with the lab results as soon as they are available. The fastest way to get your results is to activate your My Chart account. Instructions are located on the last page of this paperwork. If you have not heard from [place] regarding the results in 2 weeks, please contact this office.         Signed, Merri Ray, MD [facility]

## 2020-10-17 ENCOUNTER — Ambulatory Visit (INDEPENDENT_AMBULATORY_CARE_PROVIDER_SITE_OTHER): Payer: 59 | Admitting: Family Medicine

## 2020-10-17 ENCOUNTER — Other Ambulatory Visit: Payer: Self-pay

## 2020-10-17 VITALS — BP 128/82 | HR 75 | Temp 98.4°F | Ht 68.0 in | Wt 212.0 lb

## 2020-10-17 DIAGNOSIS — F900 Attention-deficit hyperactivity disorder, predominantly inattentive type: Secondary | ICD-10-CM

## 2020-10-17 DIAGNOSIS — F332 Major depressive disorder, recurrent severe without psychotic features: Secondary | ICD-10-CM

## 2020-10-17 DIAGNOSIS — Z79890 Hormone replacement therapy (postmenopausal): Secondary | ICD-10-CM

## 2020-10-17 DIAGNOSIS — F5104 Psychophysiologic insomnia: Secondary | ICD-10-CM | POA: Diagnosis not present

## 2020-10-17 MED ORDER — AMPHETAMINE-DEXTROAMPHET ER 20 MG PO CP24: 20.0000 mg | ORAL_CAPSULE | Freq: Two times a day (BID) | ORAL | 0 refills | Status: DC

## 2020-10-17 MED ORDER — TRAZODONE HCL 50 MG PO TABS: 50.0000 mg | ORAL_TABLET | Freq: Every evening | ORAL | 1 refills | Status: DC | PRN

## 2020-10-17 MED ORDER — HYDROXYZINE HCL 25 MG PO TABS: ORAL_TABLET | ORAL | 3 refills | Status: DC

## 2020-10-17 MED ORDER — ESTRADIOL 1 MG PO TABS: 1.0000 mg | ORAL_TABLET | Freq: Every day | ORAL | 1 refills | Status: DC

## 2020-10-17 NOTE — Progress Notes (Signed)
Subjective:  Patient ID: Crystal Williamson, female    DOB: [birth year]  Age: 55 y.o. MRN: [id number]  CC:  Chief Complaint  Patient presents with  . Medication Refill    Pt is requesting refills on her Adderall, Trazodone, and estradiol. PT reports these medications seem to work well for her. Pt states she would like the prescriptions on paper so she can take them to any pharmacy due to [facility].  No other concerns to discuss.    HPI Crystal Williamson presents for   Depression with insomnia. Discussed in November, Cymbalta and trazodone been used with higher dose of Cymbalta to 90 mg at 07/31/2020 visit.  Serotonin syndrome precautions discussed.  Last visit in November.  Trazodone 1/4 to 1/2 pill at night.  Hydroxyzine at bedtime at times as well with trazodone Did not feel well on higher dose of 90mg . Felt weird. Decreased to 30mg  dose of cymbalta - feels good at current dose.  Taking up to whole pill of trazodone - hydroxyzine needed at times - needs a refill. May change pharmacy - wants paper rx to decide on which pharmacy she will be using. Had dental infection, had surgery few weeks ago - off abx. Feels better.    Depression screen [facility] 2/9 10/17/2020 07/31/2020 03/29/2020 09/19/2019 01/16/2019  Decreased Interest 0 2 0 0 2  Down, Depressed, Hopeless 0 2 0 1 2  PHQ - 2 Score 0 4 0 1 4  Altered sleeping - 2 - 3 1  Tired, decreased energy - 3 - 3 3  Change in appetite - 2 - 1 2  Feeling bad or failure about yourself  - 1 - 2 3  Trouble concentrating - 2 - - 1  Moving slowly or fidgety/restless - 0 - 0 1  Suicidal thoughts - 0 - 0 -  PHQ-9 Score - 14 - 10 15  Difficult doing work/chores - - - - -  Some recent data might be hidden   Postmenopausal hormone Replacement therapy Previously low-dose Estrace less effective, agreed to try 1 mg at November visit.  Potential risk, benefits, alternatives of hormone replacement therapy have been discussed. Not taking daily - takes if feels  like hormones shifted. None in past month, then on past 3 days.   Attention deficit disorder Stable November at Adderall total of 40 mg XR dosing daily.Last prescription filled January 12. Controlled substance database (PDMP) reviewed. No concerns appreciated.  Urine drug screen consistent in June 2020. Current dose working. No new side effects. No other prescribers, no IDU.  Takes 2 per day, has taken additional meds on days when she feels like she needs it, when infection, or more tired, or longer day - will take one extra. Takes for anxiety and depression.  Dosing depends on work schedule. 2nd dose 4 - 6 hours.   History Patient Active Problem List   Diagnosis Date Noted  . Postmenopausal hormone replacement therapy 02/22/2018  . MDD (major depressive disorder), recurrent severe, without psychosis ([place]) 11/09/2017  . Persistent depressive disorder with anxious distress, currently severe   . Overdose 11/06/2017  . Alopecia 07/19/2012  . Adrenal gland cyst ([place]) 05/20/2012  . HEADACHE 08/06/2009  . KERATOSIS PILARIS 06/14/2009  . HYPERGLYCEMIA 02/22/2009  . DERMATOPHYTOSIS OF NAIL 02/14/2009  . OBESITY 02/14/2009  . SEBORRHEIC KERATOSIS 02/14/2009  . LOW BACK PAIN, CHRONIC 02/14/2009  . FATIGUE 02/14/2009  . Attention deficit hyperactivity disorder (ADHD) 02/14/2009   Past Medical History:  Diagnosis Date  .  Allergy   . Depression   . Fibroid   . Headache(784.0)   . No pertinent past medical history   . Varicose veins    bilateral   Past Surgical History:  Procedure Laterality Date  . ABDOMINAL HYSTERECTOMY  06/01/2012   Procedure: HYSTERECTOMY ABDOMINAL;  Surgeon: Terrance Mass, MD;  Location: [facility];  Service: Gynecology;  Laterality: N/A;  . SEPTOPLASTY    . VARICOSE VEIN SURGERY    . WISDOM TOOTH EXTRACTION     Allergies  Allergen Reactions  . Codeine Itching    REACTION: N \\T \ V  . Hydromorphone Itching  . Opium   . Percocet [Oxycodone-Acetaminophen]  Itching  . Tramadol Nausea And Vomiting   Prior to Admission medications   Medication Sig Start Date End Date Taking? Authorizing Provider  amphetamine-dextroamphetamine (ADDERALL XR) 20 MG 24 hr capsule Take 1 capsule (20 mg total) by mouth 2 (two) times daily. 07/31/20  Yes Wendie Agreste, MD  amphetamine-dextroamphetamine (ADDERALL XR) 20 MG 24 hr capsule Take 1 capsule (20 mg total) by mouth 2 (two) times daily. 07/31/20  Yes Wendie Agreste, MD  amphetamine-dextroamphetamine (ADDERALL XR) 20 MG 24 hr capsule Take 1 capsule (20 mg total) by mouth 2 (two) times daily. 07/31/20  Yes Wendie Agreste, MD  cyclobenzaprine (FLEXERIL) 10 MG tablet Take 1 tablet (10 mg total) by mouth 3 (three) times daily as needed for muscle spasms. 09/19/19  Yes Jacelyn Pi, Lilia Argue, MD  DULoxetine (CYMBALTA) 30 MG capsule Take 3 capsules (90 mg total) by mouth daily. Patient taking differently: Take 30 mg by mouth daily. 07/31/20  Yes Wendie Agreste, MD  estradiol (ESTRACE) 1 MG tablet Take 1 tablet (1 mg total) by mouth daily. 07/31/20  Yes Wendie Agreste, MD  hydrOXYzine (ATARAX/VISTARIL) 25 MG tablet TAKE 1 TABLET BY MOUTH AT BEDTIME AS NEEDED FOR ANXIETY 07/31/20  Yes Wendie Agreste, MD  traZODone (DESYREL) 50 MG tablet Take 1-2 tablets (50-100 mg total) by mouth at bedtime as needed. Patient taking differently: Take 50 mg by mouth at bedtime as needed. 07/31/20  Yes Wendie Agreste, MD   Social History   Socioeconomic History  . Marital status: Widowed    Spouse name: Not on file  . Number of children: Not on file  . Years of education: Not on file  . Highest education level: Not on file  Occupational History  . Not on file  Tobacco Use  . Smoking status: Current Every Day Smoker    Packs/day: 0.25    Types: Cigarettes  . Smokeless tobacco: Never Used  . Tobacco comment: 5 cig a day  Substance and Sexual Activity  . Alcohol use: Yes    Alcohol/week: 0.0 standard drinks     Comment: 3-4x week   . Drug use: No  . Sexual activity: Not Currently    Birth control/protection: None, Surgical  Other Topics Concern  . Not on file  Social History Narrative   ** Merged History Encounter **       Social Determinants of Health   Financial Resource Strain: Not on file  Food Insecurity: Not on file  Transportation Needs: Not on file  Physical Activity: Not on file  Stress: Not on file  Social Connections: Not on file  Intimate Partner Violence: Not on file    Review of Systems Per HPI.   Objective:   Vitals:   10/17/20 1031  BP: 128/82  Pulse: 75  Temp: 98.4  F (36.9 C)  TempSrc: Temporal  SpO2: 98%  Weight: 212 lb (96.2 kg)  Height: 5\' 8"  (1.727 m)     Physical Exam Vitals reviewed.  Constitutional:      Appearance: She is well-developed and well-nourished.  HENT:     Head: Normocephalic and atraumatic.  Eyes:     Extraocular Movements: EOM normal.     Conjunctiva/sclera: Conjunctivae normal.     Pupils: Pupils are equal, round, and reactive to light.  Neck:     Vascular: No carotid bruit.  Cardiovascular:     Rate and Rhythm: Normal rate and regular rhythm.     Pulses: Intact distal pulses.     Heart sounds: Normal heart sounds.  Pulmonary:     Effort: Pulmonary effort is normal.     Breath sounds: Normal breath sounds.  Abdominal:     Palpations: Abdomen is soft. There is no pulsatile mass.     Tenderness: There is no abdominal tenderness.  Skin:    General: Skin is warm and dry.  Neurological:     Mental Status: She is alert and oriented to person, place, and time.  Psychiatric:        Mood and Affect: Mood and affect normal.        Behavior: Behavior normal.         Assessment & Plan:  Crystal Williamson is a 55 y.o. female . MDD (major depressive disorder), recurrent severe, without psychosis ([place])  -Did not tolerate higher dose Cymbalta, reports improved symptom control at current 30 mg dose, continue same with  RTC precautions.  Option of 60 mg dosing.  Attention deficit hyperactivity disorder (ADHD), predominantly inattentive type - Plan: amphetamine-dextroamphetamine (ADDERALL XR) 20 MG 24 hr capsule, amphetamine-dextroamphetamine (ADDERALL XR) 20 MG 24 hr capsule, amphetamine-dextroamphetamine (ADDERALL XR) 20 MG 24 hr capsule  -Dosing discussed, and on review of max dosing reasonable for occasional extra dose to not exceed 60 mg daily.  Refills provided with adjusted total number of medications to accommodate for episodic additional dose.    Postmenopausal hormone replacement therapy - Plan: estradiol (ESTRACE) 1 MG tablet  -Reports stable symptom control with intermittent use.  Psychophysiological insomnia - Plan: traZODone (DESYREL) 50 MG tablet, hydrOXYzine (ATARAX/VISTARIL) 25 MG tablet  -Stable with trazodone, hydroxyzine, refilled.  Meds ordered this encounter  Medications  . amphetamine-dextroamphetamine (ADDERALL XR) 20 MG 24 hr capsule    Sig: Take 1 capsule (20 mg total) by mouth 2 (two) times daily. With 1 additional dose if needed.    Dispense:  64 capsule    Refill:  0    Ok to fill 10/31/20  . estradiol (ESTRACE) 1 MG tablet    Sig: Take 1 tablet (1 mg total) by mouth daily.    Dispense:  30 tablet    Refill:  1  . traZODone (DESYREL) 50 MG tablet    Sig: Take 1-2 tablets (50-100 mg total) by mouth at bedtime as needed.    Dispense:  180 tablet    Refill:  1  . amphetamine-dextroamphetamine (ADDERALL XR) 20 MG 24 hr capsule    Sig: Take 1 capsule (20 mg total) by mouth 2 (two) times daily. With 1 additional dose if needed.    Dispense:  64 capsule    Refill:  0    Ok to fill 11/28/20  . amphetamine-dextroamphetamine (ADDERALL XR) 20 MG 24 hr capsule    Sig: Take 1 capsule (20 mg total) by mouth 2 (two) times daily.  With 1 additional dose if needed    Dispense:  64 capsule    Refill:  0    Ok to fill 12/28/20.  . hydrOXYzine (ATARAX/VISTARIL) 25 MG tablet    Sig: TAKE 1  TABLET BY MOUTH AT BEDTIME AS NEEDED FOR ANXIETY    Dispense:  60 tablet    Refill:  3   Patient Instructions   I have adjusted the prescriptions to reflect your current dosing of medication.  Follow-up with me in 3 months for a physical and we can check lab work at that time.  Okay to adjust Cymbalta to 60 mg if needed, but if you are doing well remaining at 30 mg is fine for now.  If you do require refill of that medication prior to next visit, let me know as well as which pharmacy to send it to.  Take care.  If you have lab work done today you will be contacted with your lab results within the next 2 weeks.  If you have not heard from [place] then please contact us. The fastest way to get your results is to register for My Chart.   IF you received an x-ray today, you will receive an invoice from [facility]. Please contact [facility] at [phone] with questions or concerns regarding your invoice.   IF you received labwork today, you will receive an invoice from [facility]. Please contact [facility] at [phone] with questions or concerns regarding your invoice.   Our billing staff will not be able to assist you with questions regarding bills from these companies.  You will be contacted with the lab results as soon as they are available. The fastest way to get your results is to activate your My Chart account. Instructions are located on the last page of this paperwork. If you have not heard from [place] regarding the results in 2 weeks, please contact this office.         Signed, Merri Ray, MD [facility]

## 2020-10-17 NOTE — Patient Instructions (Addendum)
I have adjusted the prescriptions to reflect your current dosing of medication.  Follow-up with me in 3 months for a physical and we can check lab work at that time.  Okay to adjust Cymbalta to 60 mg if needed, but if you are doing well remaining at 30 mg is fine for now.  If you do require refill of that medication prior to next visit, let me know as well as which pharmacy to send it to.  Take care.  If you have lab work done today you will be contacted with your lab results within the next 2 weeks.  If you have not heard from [place] then please contact us. The fastest way to get your results is to register for My Chart.   IF you received an x-ray today, you will receive an invoice from [facility]. Please contact [facility] at [phone] with questions or concerns regarding your invoice.   IF you received labwork today, you will receive an invoice from [facility]. Please contact [facility] at [phone] with questions or concerns regarding your invoice.   Our billing staff will not be able to assist you with questions regarding bills from these companies.  You will be contacted with the lab results as soon as they are available. The fastest way to get your results is to activate your My Chart account. Instructions are located on the last page of this paperwork. If you have not heard from [place] regarding the results in 2 weeks, please contact this office.

## 2020-10-19 ENCOUNTER — Encounter: Payer: Self-pay | Admitting: Family Medicine

## 2020-11-16 ENCOUNTER — Other Ambulatory Visit: Payer: Self-pay | Admitting: Family Medicine

## 2020-11-16 DIAGNOSIS — Z79890 Hormone replacement therapy (postmenopausal): Secondary | ICD-10-CM

## 2021-01-02 NOTE — Telephone Encounter (Signed)
Opened in error

## 2021-01-16 ENCOUNTER — Telehealth (INDEPENDENT_AMBULATORY_CARE_PROVIDER_SITE_OTHER): Payer: 59 | Admitting: Family Medicine

## 2021-01-16 DIAGNOSIS — F332 Major depressive disorder, recurrent severe without psychotic features: Secondary | ICD-10-CM

## 2021-01-16 DIAGNOSIS — F5104 Psychophysiologic insomnia: Secondary | ICD-10-CM

## 2021-01-16 DIAGNOSIS — F900 Attention-deficit hyperactivity disorder, predominantly inattentive type: Secondary | ICD-10-CM

## 2021-01-16 MED ORDER — AMPHETAMINE-DEXTROAMPHET ER 20 MG PO CP24: 20.0000 mg | ORAL_CAPSULE | Freq: Two times a day (BID) | ORAL | 0 refills | Status: DC

## 2021-01-16 MED ORDER — CYCLOBENZAPRINE HCL 10 MG PO TABS: 10.0000 mg | ORAL_TABLET | Freq: Three times a day (TID) | ORAL | 1 refills | Status: DC | PRN

## 2021-01-16 MED ORDER — HYDROXYZINE HCL 25 MG PO TABS: ORAL_TABLET | ORAL | 3 refills | Status: DC

## 2021-01-16 MED ORDER — DULOXETINE HCL 30 MG PO CPEP: 30.0000 mg | ORAL_CAPSULE | Freq: Every day | ORAL | 1 refills | Status: DC

## 2021-01-16 NOTE — Patient Instructions (Addendum)
No change in meds for now. See info on managing stress. Let me know if new symptoms, but follow up in next 3 months for a physical with labs.   I have refilled the Adderall to be taken 2 pills/day with only occasional third pill if needed as we discussed on her history today that should be sufficient.  If you require more medication than prescribed, I will need to know right away and we will need to have you followed by a psychiatrist or attention deficit disorder specialist for further adjustment of medication.  Please schedule apppointment for back and neck symptoms in next few weeks. Flexeril refilled temporarily for now.  Return to the clinic or go to the nearest emergency room if any of your symptoms worsen or new symptoms occur.    Managing Stress, Adult Feeling a certain amount of stress is normal. Stress helps our body and mind get ready to deal with the demands of life. Stress hormones can motivate you to do well at work and meet your responsibilities. However severe or long-lasting (chronic) stress can affect your mental and physical health. Chronic stress puts you at higher risk for anxiety, depression, and other health problems like digestive problems, muscle aches, heart disease, high blood pressure, and stroke. What are the causes? Common causes of stress include:  Demands from work, such as deadlines, feeling overworked, or having long hours.  Pressures at home, such as money issues, disagreements with a spouse, or parenting issues.  Pressures from major life changes, such as divorce, moving, loss of a loved one, or chronic illness. You may be at higher risk for stress-related problems if you do not get enough sleep, are in poor health, do not have emotional support, or have a mental health disorder like anxiety or depression. How to recognize stress Stress can make you:  Have trouble sleeping.  Feel sad, anxious, irritable, or overwhelmed.  Lose your appetite.  Overeat or  want to eat unhealthy foods.  Want to use drugs or alcohol. Stress can also cause physical symptoms, such as:  Sore, tense muscles, especially in the shoulders and neck.  Headaches.  Trouble breathing.  A faster heart rate.  Stomach pain, nausea, or vomiting.  Diarrhea or constipation.  Trouble concentrating. Follow these instructions at home: Lifestyle  Identify the source of your stress and your reaction to it. See a therapist who can help you change your reactions.  When there are stressful events: ? Talk about it with family, friends, or co-workers. ? Try to think realistically about stressful events and not ignore them or overreact. ? Try to find the positives in a stressful situation and not focus on the negatives. ? Cut back on responsibilities at work and home, if possible. Ask for help from friends or family members if you need it.  Find ways to cope with stress, such as: ? Meditation. ? Deep breathing. ? Yoga or tai chi. ? Progressive muscle relaxation. ? Doing art, playing music, or reading. ? Making time for fun activities. ? Spending time with family and friends.  Get support from family, friends, or spiritual resources. Eating and drinking  Eat a healthy diet. This includes: ? Eating foods that are high in fiber, such as beans, whole grains, and fresh fruits and vegetables. ? Limiting foods that are high in fat and processed sugars, such as fried and sweet foods.  Do not skip meals or overeat.  Drink enough fluid to keep your urine pale yellow. Alcohol use  Do not drink alcohol if: ? Your health care provider tells you not to drink. ? You are pregnant, may be pregnant, or are planning to become pregnant.  Drinking alcohol is a way some people try to ease their stress. This can be dangerous, so if you drink alcohol: ? Limit how much you use to:  0-1 drink a day for women.  0-2 drinks a day for men. ? Be aware of how much alcohol is in your  drink. In the [place] one drink equals one 12 oz bottle of beer (355 mL), one 5 oz glass of wine (148 mL), or one 1 oz glass of hard liquor (44 mL). Activity  Include 30 minutes of exercise in your daily schedule. Exercise is a good stress reducer.  Include time in your day for an activity that you find relaxing. Try taking a walk, going on a bike ride, reading a book, or listening to music.  Schedule your time in a way that lowers stress, and keep a consistent schedule. Prioritize what is most important to get done.   General instructions  Get enough sleep. Try to go to sleep and get up at about the same time every day.  Take over-the-counter and prescription medicines only as told by your health care provider.  Do not use any products that contain nicotine or tobacco, such as cigarettes, e-cigarettes, and chewing tobacco. If you need help quitting, ask your health care provider.  Do not use drugs or smoke to cope with stress.  Keep all follow-up visits as told by your health care provider. This is important. Where to find support  Talk with your health care provider about stress management or finding a support group.  Find a therapist to work with you on your stress management techniques. Contact a health care provider if:  Your stress symptoms get worse.  You are unable to manage your stress at home.  You are struggling to stop using drugs or alcohol. Get help right away if:  You may be a danger to yourself or others.  You have any thoughts of death or suicide. If you ever feel like you may hurt yourself or others, or have thoughts about taking your own life, get help right away. You can go to your nearest emergency department or call:  Your local emergency services (911 in the [place]  A suicide crisis helpline, such as the [facility] at 940-242-0279. This is open 24 hours a day. Summary  Feeling a certain amount of stress is normal, but severe  or long-lasting (chronic) stress can affect your mental and physical health.  Chronic stress can put you at higher risk for anxiety, depression, and other health problems like digestive problems, muscle aches, heart disease, high blood pressure, and stroke.  You may be at higher risk for stress-related problems if you do not get enough sleep, are in poor health, lack emotional support, or have a mental health disorder like anxiety or depression.  Identify the source of your stress and your reaction to it. Try talking about stressful events with family, friends, or co-workers, finding a coping method, or getting support from spiritual resources.  If you need more help, talk with your health care provider about finding a support group or a mental health therapist. This information is not intended to replace advice given to you by your health care provider. Make sure you discuss any questions you have with your health care provider. Document Revised: 04/05/2019 Document Reviewed:  04/05/2019 Elsevier Patient Education  2021 [employer].

## 2021-01-16 NOTE — Progress Notes (Signed)
Virtual Visit via Video Note  I connected with Crystal Williamson on 01/16/21 at 8:10 AM by a video enabled telemedicine application and verified that I am speaking with the correct person using two identifiers.  Patient location: home My location: office   I discussed the limitations, risks, security and privacy concerns of performing an evaluation and management service by telephone and the availability of in person appointments. I also discussed with the patient that there may be a patient responsible charge related to this service. The patient expressed understanding and agreed to proceed, consent obtained  Chief complaint:  Chief Complaint  Patient presents with  . ADHD    Pt requesting refills be sent to the [name] [address] unlike her other rx. Pt reports works well no concerns at this time  . Depression    Pt reports 30 mg has been working well has not increased to 60  as previously recommended if needed. Pt does need refill upcoming.   . Anxiety    Pt needs refill of her Hydroxyzine, doing well no concerns at this time.  . Follow-up    Follow up directions from last office visit were to schedule physical with labs for this appointment pt is aware and has noted she will reschedule this for later.      History of Present Illness: Crystal Williamson is a 55 y.o. female  Depression: With insomnia.  Last discussed in January.  Had increased to Cymbalta 90 mg in November, did not tolerate higher dose.  Felt better at 30 mg dosing.  Trazodone 1 per night with hydroxyzine as needed.  Needs refills.  Decided to remain at 30 mg dose of Cymbalta. Feels like this dose is working well.  Still some stress at work. Feels like doing ok.  Did not fill hydroxyzine - would like to try that med again - has used in past.    Depression screen [facility] 2/9 01/16/2021 10/17/2020 07/31/2020 03/29/2020 09/19/2019  Decreased Interest 0 0 2 0 0  Down, Depressed, Hopeless 1 0 2 0 1  PHQ - 2  Score 1 0 4 0 1  Altered sleeping 1 - 2 - 3  Tired, decreased energy 1 - 3 - 3  Change in appetite 2 - 2 - 1  Feeling bad or failure about yourself  1 - 1 - 2  Trouble concentrating 1 - 2 - -  Moving slowly or fidgety/restless 0 - 0 - 0  Suicidal thoughts 0 - 0 - 0  PHQ-9 Score 7 - 14 - 10  Difficult doing work/chores - - - - -  Some recent data might be hidden   ADD Discussed in January Adderall total 40 mg XR daily.  We discussed the maximum dosing of that medication as occasionally did require additional dose. Controlled substance database (PDMP) reviewed. No concerns appreciated.  Dextroamphetamine last filled April 9, previously March 10, February 10.  #64. Usually taking 2 pills. Depends on the day, sometimes taking 3rd dose few days per week, but not every week. Has 5 pills left. (3.2 pills per day on average). Later in conversation - states she was working all the time, and longer hours and was taking more than 3 pills - was taking 4 pills at times. Now new schedule - will not be working as long hours.   Hx LBP/back spasms.  Has taken flexeril as needed. Last discussed with Dr. Pamella Pert December 2020.  #30 with 1 refill at that time.  Taking only as needed for back spasms. 5-6 times past month or so.  No bowel or bladder incontinence, no saddle anesthesia, no lower extremity weakness. Plans to schedule follow up appt to discuss. Has had some spasm in back and neuropathy in hands at times.      Patient Active Problem List   Diagnosis Date Noted  . Postmenopausal hormone replacement therapy 02/22/2018  . MDD (major depressive disorder), recurrent severe, without psychosis ([place]) 11/09/2017  . Persistent depressive disorder with anxious distress, currently severe   . Overdose 11/06/2017  . Alopecia 07/19/2012  . Adrenal gland cyst ([place]) 05/20/2012  . HEADACHE 08/06/2009  . KERATOSIS PILARIS 06/14/2009  . HYPERGLYCEMIA 02/22/2009  . DERMATOPHYTOSIS OF NAIL 02/14/2009  .  OBESITY 02/14/2009  . SEBORRHEIC KERATOSIS 02/14/2009  . LOW BACK PAIN, CHRONIC 02/14/2009  . FATIGUE 02/14/2009  . Attention deficit hyperactivity disorder (ADHD) 02/14/2009   Past Medical History:  Diagnosis Date  . Allergy   . Depression   . Fibroid   . Headache(784.0)   . No pertinent past medical history   . Varicose veins    bilateral   Past Surgical History:  Procedure Laterality Date  . ABDOMINAL HYSTERECTOMY  06/01/2012   Procedure: HYSTERECTOMY ABDOMINAL;  Surgeon: Terrance Mass, MD;  Location: [facility];  Service: Gynecology;  Laterality: N/A;  . SEPTOPLASTY    . VARICOSE VEIN SURGERY    . WISDOM TOOTH EXTRACTION     Allergies  Allergen Reactions  . Codeine Itching    REACTION: N \\T \ V  . Hydromorphone Itching  . Opium   . Percocet [Oxycodone-Acetaminophen] Itching  . Tramadol Nausea And Vomiting   Prior to Admission medications   Medication Sig Start Date End Date Taking? Authorizing Provider  amphetamine-dextroamphetamine (ADDERALL XR) 20 MG 24 hr capsule Take 1 capsule (20 mg total) by mouth 2 (two) times daily. With 1 additional dose if needed. 10/17/20  Yes Wendie Agreste, MD  amphetamine-dextroamphetamine (ADDERALL XR) 20 MG 24 hr capsule Take 1 capsule (20 mg total) by mouth 2 (two) times daily. With 1 additional dose if needed. 10/17/20  Yes Wendie Agreste, MD  amphetamine-dextroamphetamine (ADDERALL XR) 20 MG 24 hr capsule Take 1 capsule (20 mg total) by mouth 2 (two) times daily. With 1 additional dose if needed 10/17/20  Yes Wendie Agreste, MD  cyclobenzaprine (FLEXERIL) 10 MG tablet Take 1 tablet (10 mg total) by mouth 3 (three) times daily as needed for muscle spasms. 09/19/19  Yes Jacelyn Pi, Lilia Argue, MD  DULoxetine (CYMBALTA) 30 MG capsule Take 3 capsules (90 mg total) by mouth daily. Patient taking differently: Take 30 mg by mouth daily. 07/31/20  Yes Wendie Agreste, MD  estradiol (ESTRACE) 1 MG tablet Take 1 tablet (1 mg total) by mouth  daily. 10/17/20  Yes Wendie Agreste, MD  hydrOXYzine (ATARAX/VISTARIL) 25 MG tablet TAKE 1 TABLET BY MOUTH AT BEDTIME AS NEEDED FOR ANXIETY 10/17/20  Yes Wendie Agreste, MD  traZODone (DESYREL) 50 MG tablet Take 1-2 tablets (50-100 mg total) by mouth at bedtime as needed. 10/17/20  Yes Wendie Agreste, MD   Social History   Socioeconomic History  . Marital status: Widowed    Spouse name: Not on file  . Number of children: Not on file  . Years of education: Not on file  . Highest education level: Not on file  Occupational History  . Not on file  Tobacco Use  . Smoking status: Current Every Day  Smoker    Packs/day: 0.25    Types: Cigarettes  . Smokeless tobacco: Never Used  . Tobacco comment: 5 cig a day  Substance and Sexual Activity  . Alcohol use: Yes    Alcohol/week: 0.0 standard drinks    Comment: 3-4x week   . Drug use: No  . Sexual activity: Not Currently    Birth control/protection: None, Surgical  Other Topics Concern  . Not on file  Social History Narrative   ** Merged History Encounter **       Social Determinants of Health   Financial Resource Strain: Not on file  Food Insecurity: Not on file  Transportation Needs: Not on file  Physical Activity: Not on file  Stress: Not on file  Social Connections: Not on file  Intimate Partner Violence: Not on file    Observations/Objective: There were no vitals filed for this visit. No distress,, euthymic mood, nontoxic appearance on video.  Normal respiratory effort.  Assessment and Plan: MDD (major depressive disorder), recurrent severe, without psychosis ([place]) - Plan: DULoxetine (CYMBALTA) 30 MG capsule  -Stable control per patient.  Overall tolerating Cymbalta 30 mg dose, will continue same.  Attention deficit hyperactivity disorder (ADHD), predominantly inattentive type - Plan: amphetamine-dextroamphetamine (ADDERALL XR) 20 MG 24 hr capsule, amphetamine-dextroamphetamine (ADDERALL XR) 20 MG 24 hr capsule,  amphetamine-dextroamphetamine (ADDERALL XR) 20 MG 24 hr capsule  -I discussed with her the discrepancy between her initial history and number of pills left.  She did report that she has used 3 pills on most days with occasional fourth pill.  Again we discussed the importance of accurate history and concerns with controlled medication.  She feels this higher dosing was during longer work hours which are no longer in place.  I agreed to refill medication for now at previous dosing of 2 pills/day with occasional third dose for number 64/month.  If she requires higher dosing or further discrepancies, will need to be followed by psychiatry or attention deficit disorder specialist.  Understanding expressed.  Psychophysiological insomnia - Plan: hydrOXYzine (ATARAX/VISTARIL) 25 MG tablet  -Trial hydroxyzine, has worked well in the past.  I temporarily refilled Flexeril, but she will follow-up in office to discuss her back/neck/dysesthesias further.  ER/urgent care precautions if any acute worsening of symptoms.   Follow Up Instructions: Next few weeks to discuss\neck symptoms.   I discussed the assessment and treatment plan with the patient. The patient was provided an opportunity to ask questions and all were answered. The patient agreed with the plan and demonstrated an understanding of the instructions.   The patient was advised to call back or seek an in-person evaluation if the symptoms worsen or if the condition fails to improve as anticipated.  I provided 36 minutes of non-face-to-face time during this encounter.   Wendie Agreste, MD

## 2021-02-19 ENCOUNTER — Ambulatory Visit: Payer: 59 | Admitting: Family Medicine

## 2021-02-19 DIAGNOSIS — Z0289 Encounter for other administrative examinations: Secondary | ICD-10-CM

## 2021-02-28 ENCOUNTER — Telehealth (INDEPENDENT_AMBULATORY_CARE_PROVIDER_SITE_OTHER): Payer: 59 | Admitting: Family Medicine

## 2021-02-28 DIAGNOSIS — N951 Menopausal and female climacteric states: Secondary | ICD-10-CM | POA: Diagnosis not present

## 2021-02-28 DIAGNOSIS — F900 Attention-deficit hyperactivity disorder, predominantly inattentive type: Secondary | ICD-10-CM | POA: Diagnosis not present

## 2021-02-28 DIAGNOSIS — R5383 Other fatigue: Secondary | ICD-10-CM

## 2021-02-28 DIAGNOSIS — Z5181 Encounter for therapeutic drug level monitoring: Secondary | ICD-10-CM | POA: Diagnosis not present

## 2021-02-28 MED ORDER — AMPHETAMINE-DEXTROAMPHET ER 20 MG PO CP24: 60.0000 mg | ORAL_CAPSULE | Freq: Every day | ORAL | 0 refills | Status: DC

## 2021-02-28 NOTE — Progress Notes (Signed)
Virtual Visit via audio note  I connected with Crystal Williamson on 02/28/21 at 10:32 AM by audio (failed video after multiple attempts) and verified that I am speaking with the correct person using two identifiers.  Patient location:home My location: office - [place]   I discussed the limitations, risks, security and privacy concerns of performing an evaluation and management service by telephone and the availability of in person appointments. I also discussed with the patient that there may be a patient responsible charge related to this service. The patient expressed understanding and agreed to proceed, consent obtained.   Chief complaint:  Chief Complaint  Patient presents with   ADHD    Pt reports medication works no break through sxs.    Fatigue    Pt reports fatigue and is wondering if this is beyond what is to be expected with menopause, wanted to check some labs      History of Present Illness: Crystal Williamson is a 55 y.o. female  Depression with insomnia Last discussed in April.  Tried previously to increase Cymbalta to 90 mg but did not tolerate that dose.  Ultimately felt better at 30 mg dosing with trazodone 1 per night, hydroxyzine additionally if needed(refilled last visit) felt like 30 mg Cymbalta dose was working well for depression symptoms at last visit but some stress at work, felt like she was handling that okay. Still taking trazodone (not taking every night), has been taking hydroxyzine - not every night if tired. and cymbalta same dose. No recent flexeril. Chiropractor once per week has been helping.  Menopausal symptoms- fatigue, weight gain and some hot flushes. Partial hysterectomy for fibroids, still has ovaries. Gradual onset past 1.5 year. Had taken estradiol then stopped due to concerns about risks. now taking Estroven otc -  some relief initially.  No SI/HI.   Depression screen [facility] 2/9 01/16/2021 10/17/2020 07/31/2020 03/29/2020  09/19/2019  Decreased Interest 0 0 2 0 0  Down, Depressed, Hopeless 1 0 2 0 1  PHQ - 2 Score 1 0 4 0 1  Altered sleeping 1 - 2 - 3  Tired, decreased energy 1 - 3 - 3  Change in appetite 2 - 2 - 1  Feeling bad or failure about yourself  1 - 1 - 2  Trouble concentrating 1 - 2 - -  Moving slowly or fidgety/restless 0 - 0 - 0  Suicidal thoughts 0 - 0 - 0  PHQ-9 Score 7 - 14 - 10  Difficult doing work/chores - - - - -  Some recent data might be hidden     Lab Results  Component Value Date   TSH 1.010 03/21/2019   Lab Results  Component Value Date   WBC 7.1 03/21/2019   HGB 13.9 03/21/2019   HCT 39.7 03/21/2019   MCV 96 03/21/2019   PLT 255 03/21/2019     Attention deficit disorder See previous notes.  We have discussed the maximum dosing of Adderall.  Discussed use last visit on 01/16/21 when she was increasing her dosing up to 4 pills at times with working longer hours.  Had reported a new schedule and not working quite as long of hours.  Refilled Adderall to be taken 2 pills/day with only occasional third pill.  If persistent increased dosing needed, psychiatry evaluation/ADD specialist evaluation recommended. Controlled substance database (PDMP) reviewed.  Dextroamphetamine/amphetamine extended release 20 mg number 64 filled on 02/10/2021.  Previously filled for 64 on 01/21/2021, 12/28/2020, 11/28/2020.  Last  urine drug screen on 03/21/2019 positive for amphetamine only. Some stress at work - but looking at things differently, approaching differently.  Taking 3 adderall per day every day. Working ok for focus. Able to sleep at night. No heart palpitations.  Appetite ok during the day. No unexplained wt loss. Takes 3 pills over initial 8 hours of day.  Has 9 pills left. Has not taken meds yet today. Denies addiction to med.  No use of benzos, no use of nonprescribed meds.   Lab Results  Component Value Date   WBC 7.1 03/21/2019   HGB 13.9 03/21/2019   HCT 39.7 03/21/2019   MCV 96  03/21/2019   PLT 255 03/21/2019    Patient Active Problem List   Diagnosis Date Noted   Postmenopausal hormone replacement therapy 02/22/2018   MDD (major depressive disorder), recurrent severe, without psychosis ([place]) 11/09/2017   Persistent depressive disorder with anxious distress, currently severe    Overdose 11/06/2017   Alopecia 07/19/2012   Adrenal gland cyst ([place]) 05/20/2012   HEADACHE 08/06/2009   KERATOSIS PILARIS 06/14/2009   HYPERGLYCEMIA 02/22/2009   DERMATOPHYTOSIS OF NAIL 02/14/2009   OBESITY 02/14/2009   SEBORRHEIC KERATOSIS 02/14/2009   LOW BACK PAIN, CHRONIC 02/14/2009   FATIGUE 02/14/2009   Attention deficit hyperactivity disorder (ADHD) 02/14/2009   Past Medical History:  Diagnosis Date   Allergy    Depression    Fibroid    Headache(784.0)    No pertinent past medical history    Varicose veins    bilateral   Past Surgical History:  Procedure Laterality Date   ABDOMINAL HYSTERECTOMY  06/01/2012   Procedure: HYSTERECTOMY ABDOMINAL;  Surgeon: Terrance Mass, MD;  Location: [facility];  Service: Gynecology;  Laterality: N/A;   SEPTOPLASTY     VARICOSE VEIN SURGERY     WISDOM TOOTH EXTRACTION     Allergies  Allergen Reactions   Codeine Itching    REACTION: N \\T \ V   Hydromorphone Itching   Opium    Percocet [Oxycodone-Acetaminophen] Itching   Tramadol Nausea And Vomiting   Prior to Admission medications   Medication Sig Start Date End Date Taking? Authorizing Provider  amphetamine-dextroamphetamine (ADDERALL XR) 20 MG 24 hr capsule Take 1 capsule (20 mg total) by mouth 2 (two) times daily. With 1 additional dose if needed. 01/16/21   Wendie Agreste, MD  amphetamine-dextroamphetamine (ADDERALL XR) 20 MG 24 hr capsule Take 1 capsule (20 mg total) by mouth 2 (two) times daily. With 1 additional dose if needed. 01/16/21   Wendie Agreste, MD  amphetamine-dextroamphetamine (ADDERALL XR) 20 MG 24 hr capsule Take 1 capsule (20 mg total) by mouth 2 (two)  times daily. With 1 additional dose if needed 01/16/21   Wendie Agreste, MD  cyclobenzaprine (FLEXERIL) 10 MG tablet Take 1 tablet (10 mg total) by mouth 3 (three) times daily as needed for muscle spasms. 01/16/21   Wendie Agreste, MD  DULoxetine (CYMBALTA) 30 MG capsule Take 1 capsule (30 mg total) by mouth daily. 01/16/21   Wendie Agreste, MD  estradiol (ESTRACE) 1 MG tablet Take 1 tablet (1 mg total) by mouth daily. 10/17/20   Wendie Agreste, MD  hydrOXYzine (ATARAX/VISTARIL) 25 MG tablet TAKE 1 TABLET BY MOUTH AT BEDTIME AS NEEDED FOR ANXIETY 01/16/21   Wendie Agreste, MD  traZODone (DESYREL) 50 MG tablet Take 1-2 tablets (50-100 mg total) by mouth at bedtime as needed. 10/17/20   Wendie Agreste, MD   Social  History   Socioeconomic History   Marital status: Widowed    Spouse name: Not on file   Number of children: Not on file   Years of education: Not on file   Highest education level: Not on file  Occupational History   Not on file  Tobacco Use   Smoking status: Every Day    Packs/day: 0.25    Pack years: 0.00    Types: Cigarettes   Smokeless tobacco: Never   Tobacco comments:    5 cig a day  Substance and Sexual Activity   Alcohol use: Yes    Alcohol/week: 0.0 standard drinks    Comment: 3-4x week    Drug use: No   Sexual activity: Not Currently    Birth control/protection: None, Surgical  Other Topics Concern   Not on file  Social History Narrative   ** Merged History Encounter **       Social Determinants of Health   Financial Resource Strain: Not on file  Food Insecurity: Not on file  Transportation Needs: Not on file  Physical Activity: Not on file  Stress: Not on file  Social Connections: Not on file  Intimate Partner Violence: Not on file    Observations/Objective: There were no vitals filed for this visit. No distress on phone, appropriate responses, euthymic mood.  All questions were answered with understanding of plan expressed.  Denies  suicidal/homicidal ideation.  Assessment and Plan: Fatigue, unspecified type - Plan: FSH, TSH, CBC, Comprehensive metabolic panel  -May be multifactorial including menopause, side effects from trazodone or hydroxyzine, but has noticed on days without taking medication.  May be a component of work or mood symptoms.  Check FSH for menopausal symptoms, separate follow-up recommended to discuss hormone replacement therapy risks and benefits.  Check CBC, TSH for fatigue.  Follow-up in the next few weeks.  Attention deficit hyperactivity disorder (ADHD), predominantly inattentive type - Plan: amphetamine-dextroamphetamine (ADDERALL XR) 20 MG 24 hr capsule, DRUG MONITORING, PANEL 8 WITH CONFIRMATION, URINE  -Now requiring 60 mg daily daily of Adderall XR.  1 month refill placed.  Can discuss further next visit.  If any decrease in control or med changes needed will refer to psychiatry.  Urine drug screen planned for lab visit next week.  Menopausal hot flushes - Plan: FSH, TSH  Medication monitoring encounter - Plan: DRUG MONITORING, PANEL 8 WITH CONFIRMATION, URINE   Follow Up Instructions: Lab visit next week, in office next few weeks.   I discussed the assessment and treatment plan with the patient. The patient was provided an opportunity to ask questions and all were answered. The patient agreed with the plan and demonstrated an understanding of the instructions.   The patient was advised to call back or seek an in-person evaluation if the symptoms worsen or if the condition fails to improve as anticipated.  I provided 23 minutes of non-face-to-face time during this encounter.   Wendie Agreste, MD

## 2021-03-14 ENCOUNTER — Telehealth: Payer: Self-pay | Admitting: Family Medicine

## 2021-03-14 ENCOUNTER — Other Ambulatory Visit: Payer: Self-pay

## 2021-03-14 DIAGNOSIS — F900 Attention-deficit hyperactivity disorder, predominantly inattentive type: Secondary | ICD-10-CM

## 2021-03-14 NOTE — Telephone Encounter (Signed)
Pt called in her next appt is 04/03/21 with Dr. Carlota Raspberry. She will be out of the adderall before then. Can we send them into the [facility] on [address].

## 2021-03-14 NOTE — Telephone Encounter (Signed)
Request sent to pcp for approval.

## 2021-03-14 NOTE — Telephone Encounter (Signed)
Pt called in her next appt is 04/03/21 with Dr. Carlota Raspberry. She will be out of the adderall before then. Can we send them into the [facility] on [address].   LFD 01/16/21 #64 with no refills LOV 02/28/21 NOV 04/03/21

## 2021-03-15 NOTE — Telephone Encounter (Signed)
Controlled substance database (PDMP) reviewed. No concerns appreciated. Last filled 03/01/21 for #90. See last OV and plan on 02/28/21.  She was to follow up for lab visit after our video visit, and do not see that performed yet. Please have her complete this week. I can likely refill meds after labs but befire she would be due to run out (prior to our scheduled follow up visit).

## 2021-03-17 ENCOUNTER — Other Ambulatory Visit: Payer: Self-pay

## 2021-03-17 ENCOUNTER — Other Ambulatory Visit (INDEPENDENT_AMBULATORY_CARE_PROVIDER_SITE_OTHER): Payer: 59

## 2021-03-17 ENCOUNTER — Telehealth: Payer: Self-pay

## 2021-03-17 DIAGNOSIS — R5383 Other fatigue: Secondary | ICD-10-CM

## 2021-03-17 DIAGNOSIS — F900 Attention-deficit hyperactivity disorder, predominantly inattentive type: Secondary | ICD-10-CM

## 2021-03-17 DIAGNOSIS — Z5181 Encounter for therapeutic drug level monitoring: Secondary | ICD-10-CM

## 2021-03-17 DIAGNOSIS — N951 Menopausal and female climacteric states: Secondary | ICD-10-CM

## 2021-03-17 LAB — CBC
HCT: 43 % (ref 36.0–46.0)
Hemoglobin: 14.6 g/dL (ref 12.0–15.0)
MCHC: 33.9 g/dL (ref 30.0–36.0)
MCV: 95.4 fl (ref 78.0–100.0)
Platelets: 215 10*3/uL (ref 150.0–400.0)
RBC: 4.5 Mil/uL (ref 3.87–5.11)
RDW: 13.5 % (ref 11.5–15.5)
WBC: 7.8 10*3/uL (ref 4.0–10.5)

## 2021-03-17 LAB — TSH: TSH: 1.23 u[IU]/mL (ref 0.35–4.50)

## 2021-03-17 LAB — COMPREHENSIVE METABOLIC PANEL
ALT: 42 U/L — ABNORMAL HIGH (ref 0–35)
AST: 25 U/L (ref 0–37)
Albumin: 4.6 g/dL (ref 3.5–5.2)
Alkaline Phosphatase: 87 U/L (ref 39–117)
BUN: 27 mg/dL — ABNORMAL HIGH (ref 6–23)
CO2: 24 mEq/L (ref 19–32)
Calcium: 9.7 mg/dL (ref 8.4–10.5)
Chloride: 101 mEq/L (ref 96–112)
Creatinine, Ser: 0.56 mg/dL (ref 0.40–1.20)
GFR: 103.56 mL/min (ref >60.00)
Glucose, Bld: 111 mg/dL — ABNORMAL HIGH (ref 70–99)
Potassium: 4 mEq/L (ref 3.5–5.1)
Sodium: 135 mEq/L (ref 135–145)
Total Bilirubin: 0.4 mg/dL (ref 0.2–1.2)
Total Protein: 6.8 g/dL (ref 6.0–8.3)

## 2021-03-17 LAB — FOLLICLE STIMULATING HORMONE: FSH: 70.8 m[IU]/mL

## 2021-03-17 NOTE — Telephone Encounter (Signed)
Pt has a appt for July 14th earliest we are able to get her appt and she runs out of her amphetamine-dextroamphetamine (ADDERALL XR) 20 MG 24 hr capsule [[id number]] [facility] [id number] - [place], [place] - [address]   Pt call back [phone]

## 2021-03-23 LAB — DRUG MONITORING, PANEL 8 WITH CONFIRMATION, URINE
6 Acetylmorphine: NEGATIVE ng/mL (ref <10)
Alcohol Metabolites: POSITIVE ng/mL — AB (ref <500)
Amphetamine: >15000 ng/mL — ABNORMAL HIGH (ref <250)
Amphetamines: POSITIVE ng/mL — AB (ref <500)
Benzodiazepines: NEGATIVE ng/mL (ref <100)
Buprenorphine, Urine: NEGATIVE ng/mL (ref <5)
Cocaine Metabolite: NEGATIVE ng/mL (ref <150)
Creatinine: 59.1 mg/dL (ref >=20.0)
Ethyl Glucuronide (ETG): >10000 ng/mL — ABNORMAL HIGH (ref <500)
Ethyl Sulfate (ETS): 4212 ng/mL — ABNORMAL HIGH (ref <100)
MDMA: NEGATIVE ng/mL (ref <500)
Marijuana Metabolite: NEGATIVE ng/mL (ref <20)
Methamphetamine: NEGATIVE ng/mL (ref <250)
Opiates: NEGATIVE ng/mL (ref <100)
Oxidant: NEGATIVE ug/mL (ref <200)
Oxycodone: NEGATIVE ng/mL (ref <100)
pH: 6 (ref 4.5–9.0)

## 2021-03-23 LAB — DM TEMPLATE

## 2021-04-03 ENCOUNTER — Ambulatory Visit: Payer: 59 | Admitting: Family Medicine

## 2021-04-03 ENCOUNTER — Other Ambulatory Visit: Payer: Self-pay

## 2021-04-10 ENCOUNTER — Telehealth: Payer: Self-pay

## 2021-04-10 ENCOUNTER — Ambulatory Visit: Payer: 59 | Admitting: Family Medicine

## 2021-04-10 ENCOUNTER — Other Ambulatory Visit: Payer: Self-pay

## 2021-04-10 DIAGNOSIS — F900 Attention-deficit hyperactivity disorder, predominantly inattentive type: Secondary | ICD-10-CM

## 2021-04-10 MED ORDER — AMPHETAMINE-DEXTROAMPHET ER 20 MG PO CP24
60.0000 mg | ORAL_CAPSULE | Freq: Every day | ORAL | 0 refills | Status: DC
Start: 2021-04-10 — End: 2021-04-23

## 2021-04-10 NOTE — Telephone Encounter (Signed)
Unable to be seen on her July 14 appointment, as well as today, but does have appointment scheduled August 3rd. Will need to keep that appointment.   Controlled substance database reviewed.  Medication last filled July 9th for #64 dextroamphetamine ER 20 mg.  Medications last discussed June 10.  She was taking 60 mg total or 3 Adderall per day every day.  Based on that frequency, medications should last 21 days.  Refills should be due on July 30.  Will order refill for that time as next visit not until August 3.  Let me know if there are questions.

## 2021-04-10 NOTE — Telephone Encounter (Signed)
Pt missed her appt we have rescheduled to [facility] August 3rd patient for her time mixed up and thought her appt was at 10:40 she is wanting to get a refill on amphetamine-dextroamphetamine (ADDERALL XR) 20 MG 24 hr capsule [[id number]]    [facility] [id number] - [place], [place] - [address]   Pt call back [phone]

## 2021-04-10 NOTE — Telephone Encounter (Signed)
Pt missed her appt we have rescheduled to [facility] August 3rd patient for her time mixed up and thought her appt was at 10:40 she is wanting to get a refill on amphetamine-dextroamphetamine (ADDERALL XR) 20 MG 24 hr capsule [[id number]]     [facility] [id number] - [place], [place] - [address]    LFD 02/28/21 #90 with no refills LOV 02/28/21 NOV 04/23/21

## 2021-04-10 NOTE — Telephone Encounter (Signed)
Request sent to provider for approval.  

## 2021-04-23 ENCOUNTER — Ambulatory Visit (INDEPENDENT_AMBULATORY_CARE_PROVIDER_SITE_OTHER): Payer: 59 | Admitting: Family Medicine

## 2021-04-23 ENCOUNTER — Other Ambulatory Visit: Payer: Self-pay

## 2021-04-23 ENCOUNTER — Encounter: Payer: Self-pay | Admitting: Family Medicine

## 2021-04-23 VITALS — BP 126/74 | HR 89 | Temp 97.6°F | Resp 16 | Ht 68.0 in | Wt 206.8 lb

## 2021-04-23 DIAGNOSIS — D229 Melanocytic nevi, unspecified: Secondary | ICD-10-CM | POA: Diagnosis not present

## 2021-04-23 DIAGNOSIS — F332 Major depressive disorder, recurrent severe without psychotic features: Secondary | ICD-10-CM

## 2021-04-23 DIAGNOSIS — F900 Attention-deficit hyperactivity disorder, predominantly inattentive type: Secondary | ICD-10-CM

## 2021-04-23 DIAGNOSIS — M62830 Muscle spasm of back: Secondary | ICD-10-CM

## 2021-04-23 DIAGNOSIS — N951 Menopausal and female climacteric states: Secondary | ICD-10-CM

## 2021-04-23 DIAGNOSIS — M62838 Other muscle spasm: Secondary | ICD-10-CM

## 2021-04-23 DIAGNOSIS — R5383 Other fatigue: Secondary | ICD-10-CM

## 2021-04-23 DIAGNOSIS — R7989 Other specified abnormal findings of blood chemistry: Secondary | ICD-10-CM

## 2021-04-23 LAB — COMPREHENSIVE METABOLIC PANEL
ALT: 32 U/L (ref 0–35)
AST: 24 U/L (ref 0–37)
Albumin: 4.5 g/dL (ref 3.5–5.2)
Alkaline Phosphatase: 85 U/L (ref 39–117)
BUN: 21 mg/dL (ref 6–23)
CO2: 24 mEq/L (ref 19–32)
Calcium: 9.4 mg/dL (ref 8.4–10.5)
Chloride: 103 mEq/L (ref 96–112)
Creatinine, Ser: 0.59 mg/dL (ref 0.40–1.20)
GFR: 102.19 mL/min (ref >60.00)
Glucose, Bld: 98 mg/dL (ref 70–99)
Potassium: 4 mEq/L (ref 3.5–5.1)
Sodium: 137 mEq/L (ref 135–145)
Total Bilirubin: 0.5 mg/dL (ref 0.2–1.2)
Total Protein: 7 g/dL (ref 6.0–8.3)

## 2021-04-23 MED ORDER — CYCLOBENZAPRINE HCL 5 MG PO TABS: 5.0000 mg | ORAL_TABLET | Freq: Three times a day (TID) | ORAL | 0 refills | Status: DC | PRN

## 2021-04-23 MED ORDER — AMPHETAMINE-DEXTROAMPHET ER 20 MG PO CP24: 60.0000 mg | ORAL_CAPSULE | Freq: Every day | ORAL | 0 refills | Status: DC

## 2021-04-23 NOTE — Progress Notes (Signed)
Subjective:  Patient ID: Crystal Williamson, female    DOB: 11/19/1965  Age: 55 y.o. MRN: [id number]  CC:  Chief Complaint  Patient presents with   ADHD    Pt here for refill adderall as well as to review last visits lab work. Pt reports no concerns.    Spasms    Pt is requesting refill flexeril for her back spasms works well pt reports works well, pt is requesting 5 mg rather than 10 so she can use smaller dose for less severe instances    Referral    Pt requesting referrals to vein specialist due to veinous insufficiency as well as dermatology for multiple mole removal    HPI Crystal Williamson presents for   Attention deficit disorder See prior visits regarding medication changes, plan.  Ultimately was taking 3 Adderall per day every day at last visit to help with focus, working well at that dose.  Denied any heart palpitations, was able to sleep, appetite was okay without weight loss.  She was continued on Adderall 60 mg total dose per day. Still some fatigue, but not in a funk - more her self. Focus has been doing better on meds.  No heart palpitations. No difficulty getting to sleep. Appetite doing ok.  Urine drug screen obtained last visit on June 27 consistent with use of amphetamines.  However also positive for alcohol. Alcohol use: every day to every other day, sometimes not at all. 1-2 glasses of wine. 6-7 drinks per week.  Denies morning drinks, not prior to work. May have had more drinks night prior. Denies addiction to alcohol. No recent DUI - at age 75 only no work/legal problems.  Does admit to drinking more alcohol if running out of adderall or more stress. Does not want to take trazadone if not needed. Takes 1/4 pill to 1 pill at bedtime to help keep sleep. Hydroxyzine as needed only - when feeling more wound up after busy day. Feels tired/dizzy next day. Has had alcohol at nights with hydroxyzine.  Cymbalta '30mg'$  qd.  Admitted at behavioral health for major depressive  disorder in 2019.  Denies SI.  Black cohosh for postmenopausal sx's ([facility]) last week. Adderall last filled 7/30.   Depression screen [facility] 2/9 04/23/2021 01/16/2021 10/17/2020 07/31/2020 03/29/2020  Decreased Interest 1 0 0 2 0  Down, Depressed, Hopeless 1 1 0 2 0  PHQ - 2 Score 2 1 0 4 0  Altered sleeping 1 1 - 2 -  Tired, decreased energy 3 1 - 3 -  Change in appetite 0 2 - 2 -  Feeling bad or failure about yourself  1 1 - 1 -  Trouble concentrating 1 1 - 2 -  Moving slowly or fidgety/restless 0 0 - 0 -  Suicidal thoughts 0 0 - 0 -  PHQ-9 Score 8 7 - 14 -  Difficult doing work/chores - - - - -  Some recent data might be hidden    Elevated lfts: Alcohol as above. No abd pain/n/v.  Lab Results  Component Value Date   ALT 32 04/23/2021   AST 24 04/23/2021   ALKPHOS 85 04/23/2021   BILITOT 0.5 04/23/2021    Episodic neck/back pain Is use Flexeril as needed, treated by chiropractor in the past.  Would like to try lower dose Flexeril as option. Taking few times per month at the most.   Vein concern Has seen vein specialist in past for mild vein disease. Wears. compression stockings.  Saw Dr. Renaldo Reel in past, not in network.  Declines referral to vein and vascular. Did not think was helpful as scan lying down.   Multiple nevi: Various areas, had new moe under eye, better now. Prior derm Dr. Wilhemina Bonito. Would like to see different provider.    History Patient Active Problem List   Diagnosis Date Noted   Postmenopausal hormone replacement therapy 02/22/2018   MDD (major depressive disorder), recurrent severe, without psychosis ([place]) 11/09/2017   Persistent depressive disorder with anxious distress, currently severe    Overdose 11/06/2017   Alopecia 07/19/2012   Adrenal gland cyst ([place]) 05/20/2012   HEADACHE 08/06/2009   KERATOSIS PILARIS 06/14/2009   HYPERGLYCEMIA 02/22/2009   DERMATOPHYTOSIS OF NAIL 02/14/2009   OBESITY 02/14/2009   SEBORRHEIC KERATOSIS 02/14/2009    LOW BACK PAIN, CHRONIC 02/14/2009   FATIGUE 02/14/2009   Attention deficit hyperactivity disorder (ADHD) 02/14/2009   Past Medical History:  Diagnosis Date   Allergy    Depression    Fibroid    Headache(784.0)    No pertinent past medical history    Varicose veins    bilateral   Past Surgical History:  Procedure Laterality Date   ABDOMINAL HYSTERECTOMY  06/01/2012   Procedure: HYSTERECTOMY ABDOMINAL;  Surgeon: Terrance Mass, MD;  Location: [facility];  Service: Gynecology;  Laterality: N/A;   SEPTOPLASTY     VARICOSE VEIN SURGERY     WISDOM TOOTH EXTRACTION     Allergies  Allergen Reactions   Codeine Itching    REACTION: N' \\T'$ \ V   Hydromorphone Itching   Opium    Percocet [Oxycodone-Acetaminophen] Itching   Tramadol Nausea And Vomiting   Prior to Admission medications   Medication Sig Start Date End Date Taking? Authorizing Provider  amphetamine-dextroamphetamine (ADDERALL XR) 20 MG 24 hr capsule Take 1 capsule (20 mg total) by mouth 2 (two) times daily. With 1 additional dose if needed. 01/16/21  Yes Wendie Agreste, MD  amphetamine-dextroamphetamine (ADDERALL XR) 20 MG 24 hr capsule Take 1 capsule (20 mg total) by mouth 2 (two) times daily. With 1 additional dose if needed. 01/16/21  Yes Wendie Agreste, MD  amphetamine-dextroamphetamine (ADDERALL XR) 20 MG 24 hr capsule Take 3 capsules (60 mg total) by mouth daily. 04/10/21  Yes Wendie Agreste, MD  cyclobenzaprine (FLEXERIL) 10 MG tablet Take 1 tablet (10 mg total) by mouth 3 (three) times daily as needed for muscle spasms. 01/16/21  Yes Wendie Agreste, MD  DULoxetine (CYMBALTA) 30 MG capsule Take 1 capsule (30 mg total) by mouth daily. 01/16/21  Yes Wendie Agreste, MD  hydrOXYzine (ATARAX/VISTARIL) 25 MG tablet TAKE 1 TABLET BY MOUTH AT BEDTIME AS NEEDED FOR ANXIETY 01/16/21  Yes Wendie Agreste, MD  traZODone (DESYREL) 50 MG tablet Take 1-2 tablets (50-100 mg total) by mouth at bedtime as needed. 10/17/20  Yes  Wendie Agreste, MD  estradiol (ESTRACE) 1 MG tablet Take 1 tablet (1 mg total) by mouth daily. Patient not taking: Reported on 04/23/2021 10/17/20   Wendie Agreste, MD   Social History   Socioeconomic History   Marital status: Widowed    Spouse name: Not on file   Number of children: Not on file   Years of education: Not on file   Highest education level: Not on file  Occupational History   Not on file  Tobacco Use   Smoking status: Every Day    Packs/day: 0.25    Types: Cigarettes   Smokeless  tobacco: Never   Tobacco comments:    5 cig a day  Substance and Sexual Activity   Alcohol use: Yes    Alcohol/week: 0.0 standard drinks    Comment: 3-4x week    Drug use: No   Sexual activity: Not Currently    Birth control/protection: None, Surgical  Other Topics Concern   Not on file  Social History Narrative   ** Merged History Encounter **       Social Determinants of Health   Financial Resource Strain: Not on file  Food Insecurity: Not on file  Transportation Needs: Not on file  Physical Activity: Not on file  Stress: Not on file  Social Connections: Not on file  Intimate Partner Violence: Not on file    Review of Systems Per HPI.   Objective:   Vitals:   04/23/21 0949  BP: 126/74  Pulse: 89  Resp: 16  Temp: 97.6 F (36.4 C)  TempSrc: Temporal  SpO2: 97%  Weight: 206 lb 12.8 oz (93.8 kg)  Height: '5\' 8"'$  (1.727 m)     Physical Exam Vitals reviewed.  Constitutional:      Appearance: Normal appearance. She is well-developed.  HENT:     Head: Normocephalic and atraumatic.  Eyes:     Conjunctiva/sclera: Conjunctivae normal.     Pupils: Pupils are equal, round, and reactive to light.  Neck:     Vascular: No carotid bruit.  Cardiovascular:     Rate and Rhythm: Normal rate and regular rhythm.     Heart sounds: Normal heart sounds.  Pulmonary:     Effort: Pulmonary effort is normal.     Breath sounds: Normal breath sounds.  Abdominal:      Palpations: Abdomen is soft. There is no pulsatile mass.     Tenderness: There is no abdominal tenderness.  Musculoskeletal:     Right lower leg: No edema.     Left lower leg: No edema.  Skin:    General: Skin is warm and dry.  Neurological:     Mental Status: She is alert and oriented to person, place, and time.  Psychiatric:        Mood and Affect: Mood normal.        Behavior: Behavior normal.       Assessment & Plan:  Johnna Khayla Vorbeck is a 55 y.o. female . Attention deficit hyperactivity disorder (ADHD), predominantly inattentive type - Plan: amphetamine-dextroamphetamine (ADDERALL XR) 20 MG 24 hr capsule  -Reports overall stable symptoms for attention deficit disorder at 60 mg dose of Adderall.  We will continue same for now.  Concerns discussed regarding most recent urine drug screen with alcohol noted.  Reports possible heavier drinking night prior to that test.  Denies problem drinking at this time or need for assistance.  Did recommend decrease use with elevated LFTs.  Neck muscle spasm - Plan: cyclobenzaprine (FLEXERIL) 5 MG tablet Back muscle spasm - Plan: cyclobenzaprine (FLEXERIL) 5 MG tablet  -Infrequent use of cyclobenzaprine, lower dose of 5 mg trial.  RTC precautions if worsening symptoms or increased need.  Multiple nevi - Plan: Ambulatory referral to Dermatology  -Denies recent changes, refer to dermatology for skin cancer screening.  Discussed venous insufficiency concern and offered to place referral, declined at this time, she may follow-up with previous vein specialist.  Elevated LFTs - Plan: Comprehensive metabolic panel  -Repeat labs, decrease alcohol use, likely will repeat labs in the next 1 month to follow trend.  Menopausal hot flushes -  Plan: Ambulatory referral to Gynecology Fatigue, unspecified type  -Some of her fatigue may be related to menopausal symptoms.  Refer to GYN to discuss HRT and other treatment options.  MDD (major depressive  disorder), recurrent severe, without psychosis ([place])  -Current regimen discussed including her medications at night, and possible impact on fatigue.  I would recommend that she meet with psychiatry to discuss her medications for depression as well as attention deficit disorder and insomnia.  Referral declined at this time.  She would like to meet with GYN regarding menopausal symptoms first, but would have low threshold for psychiatry referral for medication evaluation and management.  Recheck 1 month.  Meds ordered this encounter  Medications   amphetamine-dextroamphetamine (ADDERALL XR) 20 MG 24 hr capsule    Sig: Take 3 capsules (60 mg total) by mouth daily.    Dispense:  90 capsule    Refill:  0    Ok to [facility] 05/18/21   cyclobenzaprine (FLEXERIL) 5 MG tablet    Sig: Take 1-2 tablets (5-10 mg total) by mouth 3 (three) times daily as needed for muscle spasms.    Dispense:  30 tablet    Refill:  0   Patient Instructions  If you find yourself drinking more alcohol or need for more alcohol, please let me know and I am happy to provide some help.   I do recommend decreasing alcohol use and recheck liver tests at next visit within a month. I will look at those today as well.   I do recommend meeting with psychiatry to review your medications and recommended changes. Let me know if I can place that referral.  I will refer you to gyn to discuss menopausal symptoms. I would consider changing your trazadone and hydroxyzine if persistent fatigue but will need to refer you to psychiatyr to review those meds and possible changes.   If you would like me to refer you to vein specialist, let me know.      Signed,   Merri Ray, MD [place] 04/23/21 8:34 PM

## 2021-04-23 NOTE — Patient Instructions (Addendum)
If you find yourself drinking more alcohol or need for more alcohol, please let me know and I am happy to provide some help.   I do recommend decreasing alcohol use and recheck liver tests at next visit within a month. I will look at those today as well.   I do recommend meeting with psychiatry to review your medications and recommended changes. Let me know if I can place that referral.  I will refer you to gyn to discuss menopausal symptoms. I would consider changing your trazadone and hydroxyzine if persistent fatigue but will need to refer you to psychiatyr to review those meds and possible changes.   If you would like me to refer you to vein specialist, let me know.

## 2021-05-22 ENCOUNTER — Encounter: Payer: 59 | Admitting: Family Medicine

## 2021-06-03 ENCOUNTER — Telehealth: Payer: Self-pay

## 2021-06-03 DIAGNOSIS — Z79890 Hormone replacement therapy (postmenopausal): Secondary | ICD-10-CM

## 2021-06-03 MED ORDER — ELESTRIN 0.52 MG/0.87 GM (0.06%) TD GEL: 1.0000 "application " | Freq: Every day | TRANSDERMAL | 1 refills | Status: DC

## 2021-06-03 NOTE — Telephone Encounter (Addendum)
Refilled elestrin temporarily until she can establish with OB/GYN. If not covered can look at alternatives. Estrace prescribed prior.

## 2021-06-03 NOTE — Addendum Note (Signed)
Addended by: Merri Ray R on: 06/03/2021 07:05 PM   Modules accepted: Orders

## 2021-06-03 NOTE — Telephone Encounter (Signed)
Requesting:Adderall '20mg'$  XR Contract: UDS: Last Visit:04/23/21 Next Visit:n/a Last Refill:04/23/21 90 tabs 0 refills Cymbalta '30mg'$  90 tabs 1 refill  Please Advise

## 2021-06-03 NOTE — Telephone Encounter (Signed)
Patient would like to know if she can take the Elestrin Gel. It was being prescribed by a dermatologist but no longer has a dermatologist to prescribe it for her but is currently working on finding one. Please advise

## 2021-06-03 NOTE — Telephone Encounter (Signed)
Pt is returning a call regarding I see no notes on the system regarding this?  A referral for OBGYN there was some confusion where will she got because Dr Rudi Rummage has retired? Pt is going through menopause.  Also pt needs refill on DULoxetine (CYMBALTA) 30 MG capsule  amphetamine-dextroamphetamine (ADDERALL XR) 20 MG 24 hr capsule [id number]    [id number] - [place], [place] [address]    Until she can get to a dermatologist she has taken elestrin gel can a RX be wrote for this?   Pt call back [phone]

## 2021-06-03 NOTE — Telephone Encounter (Signed)
Also noted request for Adderell. Controlled substance database (PDMP) reviewed. This was last filled 8/28 - too early for refill.

## 2021-06-04 NOTE — Telephone Encounter (Signed)
Called and left message on patient vm about status of concerns per provider.

## 2021-06-11 ENCOUNTER — Telehealth (INDEPENDENT_AMBULATORY_CARE_PROVIDER_SITE_OTHER): Payer: 59 | Admitting: Family Medicine

## 2021-06-11 ENCOUNTER — Encounter: Payer: Self-pay | Admitting: Family Medicine

## 2021-06-11 DIAGNOSIS — R509 Fever, unspecified: Secondary | ICD-10-CM

## 2021-06-11 DIAGNOSIS — R059 Cough, unspecified: Secondary | ICD-10-CM | POA: Diagnosis not present

## 2021-06-11 DIAGNOSIS — R0981 Nasal congestion: Secondary | ICD-10-CM

## 2021-06-11 MED ORDER — AZITHROMYCIN 250 MG PO TABS: ORAL_TABLET | ORAL | 0 refills | Status: AC

## 2021-06-11 MED ORDER — BENZONATATE 100 MG PO CAPS: 100.0000 mg | ORAL_CAPSULE | Freq: Three times a day (TID) | ORAL | 0 refills | Status: DC | PRN

## 2021-06-11 NOTE — Progress Notes (Signed)
Virtual Visit via audio Note Video link sent approx 125pm.  Patient declined video - reported to staff she was not able to connect in past and requested phone call only at 130pm.  I connected with Crystal Williamson on 06/11/21 at 1:34 PM by a phone and verified that I am speaking with the correct person using two identifiers.  Patient location: home - by self.  My location: office - [place].    I discussed the limitations, risks, security and privacy concerns of performing an evaluation and management service by telephone and the availability of in person appointments. I also discussed with the patient that there may be a patient responsible charge related to this service. The patient expressed understanding and agreed to proceed, consent obtained.   Chief complaint:  Chief Complaint  Patient presents with   Nasal Congestion    Pt     History of Present Illness: Crystal Williamson is a 55 y.o. female  Fever, nasal congestion: Started about 7 days ago. 9/15. Cough, sneezing, nasal congestion. Took Zicam. Felt febrile - subjective. Has not checked covid test.  Persistent cough, chest congestion, now with yellow-green nasal congestion past few weeks.  No dyspnea.  Drinking fluids, no confusion/disorientation.  No chest pains.  No known sick contacts. Has been staying home.  Chest congestion has been persistent. Minimally better.   Tx: Mucinex D, cough syrup over the counter.   No covid vaccination.   Patient Active Problem List   Diagnosis Date Noted   Postmenopausal hormone replacement therapy 02/22/2018   MDD (major depressive disorder), recurrent severe, without psychosis ([place]) 11/09/2017   Persistent depressive disorder with anxious distress, currently severe    Overdose 11/06/2017   Alopecia 07/19/2012   Adrenal gland cyst ([place]) 05/20/2012   HEADACHE 08/06/2009   KERATOSIS PILARIS 06/14/2009   HYPERGLYCEMIA 02/22/2009   DERMATOPHYTOSIS OF NAIL 02/14/2009    OBESITY 02/14/2009   SEBORRHEIC KERATOSIS 02/14/2009   LOW BACK PAIN, CHRONIC 02/14/2009   FATIGUE 02/14/2009   Attention deficit hyperactivity disorder (ADHD) 02/14/2009   Past Medical History:  Diagnosis Date   Allergy    Depression    Fibroid    Headache(784.0)    No pertinent past medical history    Varicose veins    bilateral   Past Surgical History:  Procedure Laterality Date   ABDOMINAL HYSTERECTOMY  06/01/2012   Procedure: HYSTERECTOMY ABDOMINAL;  Surgeon: Terrance Mass, MD;  Location: [facility];  Service: Gynecology;  Laterality: N/A;   SEPTOPLASTY     VARICOSE VEIN SURGERY     WISDOM TOOTH EXTRACTION     Allergies  Allergen Reactions   Codeine Itching    REACTION: N \\T \ V   Hydromorphone Itching   Opium    Percocet [Oxycodone-Acetaminophen] Itching   Tramadol Nausea And Vomiting   Prior to Admission medications   Medication Sig Start Date End Date Taking? Authorizing Provider  amphetamine-dextroamphetamine (ADDERALL XR) 20 MG 24 hr capsule Take 1 capsule (20 mg total) by mouth 2 (two) times daily. With 1 additional dose if needed. 01/16/21   Wendie Agreste, MD  amphetamine-dextroamphetamine (ADDERALL XR) 20 MG 24 hr capsule Take 1 capsule (20 mg total) by mouth 2 (two) times daily. With 1 additional dose if needed. 01/16/21   Wendie Agreste, MD  amphetamine-dextroamphetamine (ADDERALL XR) 20 MG 24 hr capsule Take 3 capsules (60 mg total) by mouth daily. 04/23/21   Wendie Agreste, MD  cyclobenzaprine (FLEXERIL) 5 MG tablet Take 1-2  tablets (5-10 mg total) by mouth 3 (three) times daily as needed for muscle spasms. 04/23/21   Wendie Agreste, MD  DULoxetine (CYMBALTA) 30 MG capsule Take 1 capsule (30 mg total) by mouth daily. 01/16/21   Wendie Agreste, MD  Estradiol (ELESTRIN) 0.52 MG/0.87 GM (0.06%) GEL Apply 1 application topically daily. 06/03/21   Wendie Agreste, MD  hydrOXYzine (ATARAX/VISTARIL) 25 MG tablet TAKE 1 TABLET BY MOUTH AT BEDTIME AS NEEDED FOR  ANXIETY 01/16/21   Wendie Agreste, MD  traZODone (DESYREL) 50 MG tablet Take 1-2 tablets (50-100 mg total) by mouth at bedtime as needed. 10/17/20   Wendie Agreste, MD   Social History   Socioeconomic History   Marital status: Widowed    Spouse name: Not on file   Number of children: Not on file   Years of education: Not on file   Highest education level: Not on file  Occupational History   Not on file  Tobacco Use   Smoking status: Every Day    Packs/day: 0.25    Types: Cigarettes   Smokeless tobacco: Never   Tobacco comments:    5 cig a day  Substance and Sexual Activity   Alcohol use: Yes    Alcohol/week: 0.0 standard drinks    Comment: 3-4x week    Drug use: No   Sexual activity: Not Currently    Birth control/protection: None, Surgical  Other Topics Concern   Not on file  Social History Narrative   ** Merged History Encounter **       Social Determinants of Health   Financial Resource Strain: Not on file  Food Insecurity: Not on file  Transportation Needs: Not on file  Physical Activity: Not on file  Stress: Not on file  Social Connections: Not on file  Intimate Partner Violence: Not on file    Observations/Objective: There were no vitals filed for this visit. Speaking in full sentences on the phone with appropriate responses, no apparent respiratory distress.  All questions were answered with understanding plan expressed.  Assessment and Plan: Cough - Plan: benzonatate (TESSALON) 100 MG capsule, azithromycin (ZITHROMAX) 250 MG tablet  Fever, unspecified fever cause - Plan: benzonatate (TESSALON) 100 MG capsule  Sinus congestion  -Likely viral infection, differential includes COVID-19 infection, does report some slight improvement but persistent chest congestion, nasal congestion.  -Saline nasal spray, continued symptomatic care with Mucinex for cough, prescribed Tessalon Perles if needed for cough.  -Check COVID-19 test today, advise me of results,  would want to watch for any worsening given no prior vaccination.  Unfortunately outside of window for antivirals.  Potential monoclonal antibody.  -Azithromycin prescribed for sinus symptoms although at this time appears to be viral.  Advised to wait, that was placed on hold if not improving into this weekend.  -If improving over the weekend and afebrile, okay to return to work on Monday.  ER precautions discussed.  Follow Up Instructions:    I discussed the assessment and treatment plan with the patient. The patient was provided an opportunity to ask questions and all were answered. The patient agreed with the plan and demonstrated an understanding of the instructions.   The patient was advised to call back or seek an in-person evaluation if the symptoms worsen or if the condition fails to improve as anticipated.  I provided 15 minutes of non-face-to-face time during this encounter.   Wendie Agreste, MD

## 2021-06-12 ENCOUNTER — Telehealth (INDEPENDENT_AMBULATORY_CARE_PROVIDER_SITE_OTHER): Payer: 59

## 2021-06-12 DIAGNOSIS — F900 Attention-deficit hyperactivity disorder, predominantly inattentive type: Secondary | ICD-10-CM

## 2021-06-12 NOTE — Telephone Encounter (Signed)
Patient called in to let PCP know that her home COVID test was negative. Also needing her Adderall refilled.

## 2021-06-13 MED ORDER — AMPHETAMINE-DEXTROAMPHET ER 20 MG PO CP24: 60.0000 mg | ORAL_CAPSULE | Freq: Every day | ORAL | 0 refills | Status: DC

## 2021-06-13 NOTE — Addendum Note (Signed)
Addended by: Merri Ray R on: 06/13/2021 08:16 AM   Modules accepted: Orders, Level of Service

## 2021-06-13 NOTE — Telephone Encounter (Signed)
glad to hear the COVID test was negative. Controlled substance database (PDMP) reviewed. No concerns appreciated. Last Adderall Rx filled 05/18/21 for #90.  Should not be due for refill until the 27th. Refill ordered to be filled at that time. Also planned on follow up 1 month after last visit to discuss ADD, mood symptoms. I know we had to cancel the 9/1 visit, but please schedule visit in next month. Let me know if there are questions.

## 2021-06-14 ENCOUNTER — Other Ambulatory Visit: Payer: Self-pay | Admitting: Family Medicine

## 2021-06-14 DIAGNOSIS — F332 Major depressive disorder, recurrent severe without psychotic features: Secondary | ICD-10-CM

## 2021-06-16 ENCOUNTER — Telehealth: Payer: Self-pay

## 2021-06-16 NOTE — Telephone Encounter (Signed)
PLEASE FAX NOTE TO [phone]

## 2021-06-16 NOTE — Telephone Encounter (Signed)
Patient is requesting a work note stating that she has been sick and had a neg covid test.  She has been out of work 9/12- 9/26 she has been sick since please advise patient was seen 9/21 just for documentation purposes    Also     . Encourage patient to contact the pharmacy for refills or they can request refills through [facility]:  Please schedule appointment if longer than 1 year  NEXT APPOINTMENT DATE:  MEDICATION:Estradiol (ELESTRIN) 0.52 MG/0.87 GM (0.06%) GEL  Is the patient out of medication?   PHARMACY:[facility] [id number] - [place], [address]

## 2021-06-17 ENCOUNTER — Other Ambulatory Visit: Payer: Self-pay

## 2021-06-17 DIAGNOSIS — Z79890 Hormone replacement therapy (postmenopausal): Secondary | ICD-10-CM

## 2021-06-17 MED ORDER — ELESTRIN 0.52 MG/0.87 GM (0.06%) TD GEL: 1.0000 "application " | Freq: Every day | TRANSDERMAL | 1 refills | Status: DC

## 2021-06-17 NOTE — Telephone Encounter (Signed)
Last refill--01/16/21 Last OV 06/11/21 Next appt--n/a

## 2021-06-17 NOTE — Telephone Encounter (Signed)
Called and spoke with patient and patient stated that she would like to go back to work by Thursday and she seems to be doing well with the antibiotics but not sure if she will be ready to return by then. Not sure what date to return to work should be placed on note. Please advise

## 2021-06-18 ENCOUNTER — Encounter: Payer: Self-pay | Admitting: Family Medicine

## 2021-06-18 NOTE — Telephone Encounter (Signed)
Noted.  Letter sent to patient via [facility].  Indicated date of visit and returning to work Thursday, 06/19/2021.  Of note the initial letter read 06/18/2021, new letter provided.  Will send message to patient.  If they do need to know the initial days out, we will need to clarify as on our visit on the 21st she indicated that she had been out/symptomatic since approximately 9/15.  Called pt - she has improved - felt better after taking antibiotics. Plans to rtw tomorrow. Will need letter faxed. Printed, signed and placed in fax bin. If she is unable to work tomorrow or any worsening symptoms, advised need for repeat visit/evaluation.

## 2021-06-24 ENCOUNTER — Telehealth (INDEPENDENT_AMBULATORY_CARE_PROVIDER_SITE_OTHER): Payer: 59 | Admitting: Registered Nurse

## 2021-06-24 ENCOUNTER — Other Ambulatory Visit: Payer: Self-pay

## 2021-06-24 DIAGNOSIS — J029 Acute pharyngitis, unspecified: Secondary | ICD-10-CM | POA: Diagnosis not present

## 2021-06-24 MED ORDER — LEVOFLOXACIN 500 MG PO TABS: 500.0000 mg | ORAL_TABLET | Freq: Every day | ORAL | 0 refills | Status: AC

## 2021-06-24 MED ORDER — PREDNISONE 10 MG (21) PO TBPK: ORAL_TABLET | ORAL | 0 refills | Status: DC

## 2021-06-24 NOTE — Progress Notes (Signed)
Telemedicine Encounter- SOAP NOTE Established Patient  This telephone encounter was conducted with the patient's (or proxy's) verbal consent via audio telecommunications: yes/no: Yes Patient was instructed to have this encounter in a suitably private space; and to only have persons present to whom they give permission to participate. In addition, patient identity was confirmed by use of name plus two identifiers (DOB and address).  I discussed the limitations, risks, security and privacy concerns of performing an evaluation and management service by telephone and the availability of in person appointments. I also discussed with the patient that there may be a patient responsible charge related to this service. The patient expressed understanding and agreed to proceed.  I spent a total of 14 minutes talking with the patient or their proxy.  Patient at home Provider in office  Participants: Kathrin Ruddy, NP and Loma Messing Eddings  No chief complaint on file.   Subjective   Crystal Williamson is a 55 y.o. established patient. Telephone visit today for chest congestion  HPI Onset 2 weeks ago Seen by PCP Dr. Carlota Raspberry 1 week ago, given z pack and tessalon Has complied with these. Finished z pack  Ongoing congestion, green and yellow mucus, productive cough  Takes Zicam for relief. Limited during this illness.  Negative COVID tests at home.  Tried to return to work on Monday, could not.  Initially had some improvement with z pack, but now symptoms recurred.  Less lower respiratory symptoms. More sore throat. No dysphagia  Patient Active Problem List   Diagnosis Date Noted   Postmenopausal hormone replacement therapy 02/22/2018   MDD (major depressive disorder), recurrent severe, without psychosis ([place]) 11/09/2017   Persistent depressive disorder with anxious distress, currently severe    Overdose 11/06/2017   Alopecia 07/19/2012   Adrenal gland cyst ([place]) 05/20/2012    HEADACHE 08/06/2009   KERATOSIS PILARIS 06/14/2009   HYPERGLYCEMIA 02/22/2009   DERMATOPHYTOSIS OF NAIL 02/14/2009   OBESITY 02/14/2009   SEBORRHEIC KERATOSIS 02/14/2009   LOW BACK PAIN, CHRONIC 02/14/2009   FATIGUE 02/14/2009   Attention deficit hyperactivity disorder (ADHD) 02/14/2009    Past Medical History:  Diagnosis Date   Allergy    Depression    Fibroid    Headache(784.0)    No pertinent past medical history    Varicose veins    bilateral    Current Outpatient Medications  Medication Sig Dispense Refill   levofloxacin (LEVAQUIN) 500 MG tablet Take 1 tablet (500 mg total) by mouth daily for 7 days. 7 tablet 0   predniSONE (STERAPRED UNI-PAK 21 TAB) 10 MG (21) TBPK tablet Take per package instructions. Do not skip doses. Finish entire supply. 1 each 0   amphetamine-dextroamphetamine (ADDERALL XR) 20 MG 24 hr capsule Take 1 capsule (20 mg total) by mouth 2 (two) times daily. With 1 additional dose if needed. 64 capsule 0   amphetamine-dextroamphetamine (ADDERALL XR) 20 MG 24 hr capsule Take 1 capsule (20 mg total) by mouth 2 (two) times daily. With 1 additional dose if needed. 64 capsule 0   amphetamine-dextroamphetamine (ADDERALL XR) 20 MG 24 hr capsule Take 3 capsules (60 mg total) by mouth daily. 90 capsule 0   benzonatate (TESSALON) 100 MG capsule Take 1 capsule (100 mg total) by mouth 3 (three) times daily as needed for cough. 20 capsule 0   cyclobenzaprine (FLEXERIL) 5 MG tablet Take 1-2 tablets (5-10 mg total) by mouth 3 (three) times daily as needed for muscle spasms. 30 tablet 0  DULoxetine (CYMBALTA) 30 MG capsule TAKE ONE CAPSULE BY MOUTH ONE TIME DAILY 90 capsule 0   Estradiol (ELESTRIN) 0.52 MG/0.87 GM (0.06%) GEL Apply 1 application topically daily. 26 g 1   hydrOXYzine (ATARAX/VISTARIL) 25 MG tablet TAKE 1 TABLET BY MOUTH AT BEDTIME AS NEEDED FOR ANXIETY 60 tablet 3   traZODone (DESYREL) 50 MG tablet Take 1-2 tablets (50-100 mg total) by mouth at bedtime as  needed. 180 tablet 1   No current facility-administered medications for this visit.    Allergies  Allergen Reactions   Codeine Itching    REACTION: N \\T \ V   Hydromorphone Itching   Opium    Percocet [Oxycodone-Acetaminophen] Itching   Tramadol Nausea And Vomiting    Social History   Socioeconomic History   Marital status: Widowed    Spouse name: Not on file   Number of children: Not on file   Years of education: Not on file   Highest education level: Not on file  Occupational History   Not on file  Tobacco Use   Smoking status: Every Day    Packs/day: 0.25    Types: Cigarettes   Smokeless tobacco: Never   Tobacco comments:    5 cig a day  Substance and Sexual Activity   Alcohol use: Yes    Alcohol/week: 0.0 standard drinks    Comment: 3-4x week    Drug use: No   Sexual activity: Not Currently    Birth control/protection: None, Surgical  Other Topics Concern   Not on file  Social History Narrative   ** Merged History Encounter **       Social Determinants of Health   Financial Resource Strain: Not on file  Food Insecurity: Not on file  Transportation Needs: Not on file  Physical Activity: Not on file  Stress: Not on file  Social Connections: Not on file  Intimate Partner Violence: Not on file    ROS Per hpi   Objective   Vitals as reported by the patient: There were no vitals filed for this visit.  Diagnoses and all orders for this visit:  Sore throat -     levofloxacin (LEVAQUIN) 500 MG tablet; Take 1 tablet (500 mg total) by mouth daily for 7 days. -     predniSONE (STERAPRED UNI-PAK 21 TAB) 10 MG (21) TBPK tablet; Take per package instructions. Do not skip doses. Finish entire supply.  PLAN Will switch over to levaquin as above Continue supportive care. Will need in office visit if persistent Patient encouraged to call clinic with any questions, comments, or concerns.   I discussed the assessment and treatment plan with the patient. The  patient was provided an opportunity to ask questions and all were answered. The patient agreed with the plan and demonstrated an understanding of the instructions.   The patient was advised to call back or seek an in-person evaluation if the symptoms worsen or if the condition fails to improve as anticipated.  I provided 14 minutes of non-face-to-face time during this encounter.  Maximiano Coss, NP

## 2021-07-14 ENCOUNTER — Telehealth: Payer: Self-pay | Admitting: Family Medicine

## 2021-07-14 DIAGNOSIS — F900 Attention-deficit hyperactivity disorder, predominantly inattentive type: Secondary | ICD-10-CM

## 2021-07-14 MED ORDER — AMPHETAMINE-DEXTROAMPHET ER 20 MG PO CP24: 60.0000 mg | ORAL_CAPSULE | Freq: Every day | ORAL | 0 refills | Status: DC

## 2021-07-14 NOTE — Telephone Encounter (Signed)
..  Caller name:Mahalie Eddings  Caller callback [phone]  Encourage patient to contact the pharmacy for refills or they can request refills through [facility]  (Please schedule appointment if patient has not been seen in over a year)  MEDICATION NAME & DOSE:  Adderall 20 mg.  Notes/Comments from patient:  [place] [facility] TO: [name] [address]  Please notify patient: It takes 48-72 hours to process rx refill requests Ask patient to call pharmacy to ensure rx is ready before heading there.   (CLINICAL TO FILL OR ROUTE PER PROTOCOLS)

## 2021-07-14 NOTE — Telephone Encounter (Signed)
Controlled substance database (PDMP) reviewed. No concerns appreciated. Last filled 06/17/21. Refill ordered to be filled 07/17/21.

## 2021-07-17 ENCOUNTER — Telehealth: Payer: Self-pay

## 2021-07-22 NOTE — Telephone Encounter (Signed)
Called LM for pt to call back so we can discuss medication

## 2021-07-22 NOTE — Telephone Encounter (Signed)
I am okay with substitution with EstroGel if needed.  I do see that she has taken Estrace previously.  I am okay with changes to what is on her formulary as long as it is okay with patient.  Please call and verify with patient.  Plan on temporary refill until she discussed with GYN.

## 2021-07-23 NOTE — Telephone Encounter (Signed)
Pt is okay with EstroGel substitution she has been unable to speak with GYN as she has been busy but was informed she will need to have them take over once she does see them. Patient expressed her gratitude

## 2021-07-24 MED ORDER — ESTROGEL 0.75 MG/1.25 GM (0.06%) TD GEL: 1.2500 g | Freq: Every day | TRANSDERMAL | 1 refills | Status: DC

## 2021-08-07 ENCOUNTER — Other Ambulatory Visit: Payer: Self-pay

## 2021-08-07 DIAGNOSIS — F900 Attention-deficit hyperactivity disorder, predominantly inattentive type: Secondary | ICD-10-CM

## 2021-08-07 NOTE — Telephone Encounter (Signed)
Caller name:Merrie Fisher callback [phone]  Encourage patient to contact the pharmacy for refills or they can request refills through [facility]  (Please schedule appointment if patient has not been seen in over a year)  MEDICATION NAME & DOSE:amphetamine-dextroamphetamine (ADDERALL XR) 20 MG 24 hr capsule [[id number]]   Notes/Comments from patient:Medication is Due on the 24th and she will be going out of town on the 23rd and wants to know if it can be filled on the 22nd   [facility] [id number] - [place], [place].   Please notify patient: It takes 48-72 hours to process rx refill requests Ask patient to call pharmacy to ensure rx is ready before heading there.   (CLINICAL TO FILL OR ROUTE PER PROTOCOLS)

## 2021-08-08 ENCOUNTER — Telehealth: Payer: Self-pay

## 2021-08-08 MED ORDER — AMPHETAMINE-DEXTROAMPHET ER 20 MG PO CP24: 60.0000 mg | ORAL_CAPSULE | Freq: Every day | ORAL | 0 refills | Status: DC

## 2021-08-08 NOTE — Telephone Encounter (Signed)
I changed her to estrogel 07/24/21. Is something different needed?

## 2021-08-08 NOTE — Telephone Encounter (Signed)
Rx changed since PA was requested, no issues no further actions required

## 2021-08-08 NOTE — Telephone Encounter (Signed)
Request for PA of Elestrin 0.06% requested via [place], requesting that this be changed to covered formulary Estrogel

## 2021-08-08 NOTE — Telephone Encounter (Signed)
Patient is requesting a refill of the following medications: Requested Prescriptions   Pending Prescriptions Disp Refills   amphetamine-dextroamphetamine (ADDERALL XR) 20 MG 24 hr capsule 90 capsule 0    Sig: Take 3 capsules (60 mg total) by mouth daily.    Date of patient request: 08/08/21 Last office visit: 06/11/21 Date of last refill: 07/14/21 Last refill amount: 90

## 2021-08-08 NOTE — Telephone Encounter (Signed)
Controlled substance database (PDMP) reviewed.  Last filled 07/17/2021, which would make the next fill date of 08/16/2021 given 31 days in October.  As the 26th falls on the Thanksgiving holiday weekend, I will agree to a refill on 08/12/2021, but next refill due should be December 26th.

## 2021-09-05 ENCOUNTER — Telehealth: Payer: Self-pay

## 2021-09-05 DIAGNOSIS — F900 Attention-deficit hyperactivity disorder, predominantly inattentive type: Secondary | ICD-10-CM

## 2021-09-05 NOTE — Telephone Encounter (Signed)
Caller name:Aften Eddings   Caller callback [phone]  Encourage patient to contact the pharmacy for refills or they can request refills through [facility]  (Please schedule appointment if patient has not been seen in over a year)  MEDICATION NAME & DOSE:amphetamine-dextroamphetamine (ADDERALL XR) 20 MG 24 hr capsule [[id number]]   Notes/Comments from [place] TO: [place]   Please notify patient: It takes 48-72 hours to process rx refill requests Ask patient to call pharmacy to ensure rx is ready before heading there.   (CLINICAL TO FILL OR ROUTE PER PROTOCOLS)

## 2021-09-06 MED ORDER — AMPHETAMINE-DEXTROAMPHET ER 20 MG PO CP24: 60.0000 mg | ORAL_CAPSULE | Freq: Every day | ORAL | 0 refills | Status: DC

## 2021-09-06 NOTE — Telephone Encounter (Signed)
Controlled substance database was reviewed.  Last filled dextroamphetamine extended release 20 mg caps #90 for 30-day supply on 08/13/2021.  New prescription will be ordered to fill on 09/12/2021.

## 2021-09-10 NOTE — Telephone Encounter (Signed)
Pt is aware that the refill isn't due to until 12/23, she just wanted to call ahead due to the upcoming holiday.

## 2021-09-11 ENCOUNTER — Other Ambulatory Visit: Payer: Self-pay | Admitting: Family Medicine

## 2021-09-11 ENCOUNTER — Telehealth: Payer: Self-pay

## 2021-09-11 DIAGNOSIS — F900 Attention-deficit hyperactivity disorder, predominantly inattentive type: Secondary | ICD-10-CM

## 2021-09-11 DIAGNOSIS — F332 Major depressive disorder, recurrent severe without psychotic features: Secondary | ICD-10-CM

## 2021-09-11 NOTE — Telephone Encounter (Incomplete Revision)
Caller name:Vadis Eddings   Caller callback [phone]   Encourage patient to contact the pharmacy for refills or they can request refills through [facility]  (Please schedule appointment if patient has not been seen in over a year)  MEDICATION NAME & DOSE:amphetamine-dextroamphetamine (ADDERALL XR) 20 MG 24 hr capsule [[id number]]   Notes/Comments from patient:Pt is unable to go to this pharmacy pt works for [employer] and [employer] will not allow her to get her RX filled there   [place] TO: [place]   Please notify patient: It takes 48-72 hours to process rx refill requests Ask patient to call pharmacy to ensure rx is ready before heading there.   (CLINICAL TO FILL OR ROUTE PER PROTOCOLS)

## 2021-09-11 NOTE — Telephone Encounter (Addendum)
Caller name:Fujiko Eddings   Caller callback [phone]   Encourage patient to contact the pharmacy for refills or they can request refills through [facility]  (Please schedule appointment if patient has not been seen in over a year)  MEDICATION NAME & DOSE:amphetamine-dextroamphetamine (ADDERALL XR) 20 MG 24 hr capsule [[id number]]   Notes/Comments from patient:Pt is unable to go to this pharmacy pt works for [employer] and [employer] will not allow her to get her RX filled there   [place] TO: [place]   Please notify patient: It takes 48-72 hours to process rx refill requests Ask patient to call pharmacy to ensure rx is ready before heading there.   (CLINICAL TO FILL OR ROUTE PER PROTOCOLS)

## 2021-09-12 MED ORDER — AMPHETAMINE-DEXTROAMPHET ER 20 MG PO CP24: 60.0000 mg | ORAL_CAPSULE | Freq: Every day | ORAL | 0 refills | Status: DC

## 2021-09-12 NOTE — Addendum Note (Signed)
Addended by: Merri Ray R on: 09/12/2021 02:50 PM   Modules accepted: Orders

## 2021-09-12 NOTE — Telephone Encounter (Signed)
LVM for patient to return call. According to chart, the adderall was sent on 09/06/2021

## 2021-09-12 NOTE — Telephone Encounter (Signed)
Called new [facility], canceled previous prescription that was sent.  New prescription sent to [facility] on [facility] [address].  Will send message to patient.

## 2021-09-17 NOTE — Telephone Encounter (Signed)
Pt called in stating that she only had enough money to get 70 tabs of the Adderall. She didn't know how to go about getting the other 20 tabs if she even can. Pt uses [name] on [address]  Please advise

## 2021-09-18 NOTE — Telephone Encounter (Signed)
Pt called in stating that she only had enough money to get 70 tabs of the Adderall. She didn't know how to go about getting the other 20 tabs if she even can. Pt uses [name] on [address]   Please advise

## 2021-09-19 NOTE — Telephone Encounter (Signed)
Pharmacy should be able to fill remaining quantity when due. If cost prohibitive we can meet to discuss alternative options.

## 2021-09-23 NOTE — Telephone Encounter (Signed)
Pt called back and reports the pharmacist told her she was forfeiting the other 20 without a special prescription, would you like to write at script for 20 tablets to make up this Rx or approve early refill of next supply?

## 2021-09-23 NOTE — Telephone Encounter (Signed)
Called pt, no answer, LM stating for her to return call to the office to advise of Dr Mancel Bale notice.   Patient should be able to obtain other portion of prescription when she is due and the pharmacist should have a record of all of that and she should be able to simply call and find out.

## 2021-09-24 NOTE — Telephone Encounter (Signed)
This was verified by pharmacist -apologies that statement was not included in original message

## 2021-09-24 NOTE — Telephone Encounter (Signed)
Either option is reasonable, the 20 pills or early refill for the 1 month supply, depends on what works for her costwise. 23 days filled on 12/23 - next rx due for refill on 10/05/21.

## 2021-09-24 NOTE — Telephone Encounter (Signed)
Please call pharmacy to verify this information provided by patient prior to new prescription being approved.

## 2021-09-25 NOTE — Telephone Encounter (Signed)
Called pt to discuss options, no answer, LM for pt to return call to the office to decide on short "remaining script" or early next month supply

## 2021-09-26 NOTE — Telephone Encounter (Signed)
Patient called and states that she thought about it and cost wise it would be best to get the 30 day supply on the 15th. She appreciates the help with this.

## 2021-09-28 MED ORDER — AMPHETAMINE-DEXTROAMPHET ER 20 MG PO CP24: 60.0000 mg | ORAL_CAPSULE | Freq: Every day | ORAL | 0 refills | Status: DC

## 2021-09-28 NOTE — Addendum Note (Signed)
Addended by: Merri Ray R on: 09/28/2021 06:29 PM   Modules accepted: Orders

## 2021-09-28 NOTE — Telephone Encounter (Signed)
Patient message noted.  Controlled substance database reviewed.  #70 filled on 09/12/2021, fill date of 10/05/2021 entered on prescription for 30-day supply.

## 2021-10-06 ENCOUNTER — Other Ambulatory Visit: Payer: Self-pay

## 2021-10-06 DIAGNOSIS — F5104 Psychophysiologic insomnia: Secondary | ICD-10-CM

## 2021-10-06 NOTE — Telephone Encounter (Signed)
Caller name:Kayia Barnie Del   On DPR? :Yes  Call back [phone]  Provider they see: Carlota Raspberry   Reason for call:Pt needs refill on traZODone (DESYREL) 50 MG tablet , hydrOXYzine (ATARAX/VISTARIL) 25 MG tablet  NOTE PHARMACY CHANGE : [place]

## 2021-10-07 MED ORDER — TRAZODONE HCL 50 MG PO TABS: 25.0000 mg | ORAL_TABLET | Freq: Every evening | ORAL | 1 refills | Status: DC | PRN

## 2021-10-07 MED ORDER — HYDROXYZINE HCL 25 MG PO TABS: ORAL_TABLET | ORAL | 1 refills | Status: DC

## 2021-10-07 NOTE — Telephone Encounter (Signed)
Trazodone, hydroxyzine refilled.  Last visit in August, please schedule appointment in the next month to 6 weeks to review medications including meds for ADD. Let me know if there are questions.

## 2021-10-07 NOTE — Telephone Encounter (Signed)
LM asking pt to call back to schedule an appt E[facility]

## 2021-10-07 NOTE — Telephone Encounter (Signed)
Patient is requesting a refill of the following medications: Requested Prescriptions   Pending Prescriptions Disp Refills   hydrOXYzine (ATARAX) 25 MG tablet 60 tablet 3    Sig: TAKE 1 TABLET BY MOUTH AT BEDTIME AS NEEDED FOR ANXIETY   traZODone (DESYREL) 50 MG tablet 180 tablet 1    Sig: Take 1-2 tablets (50-100 mg total) by mouth at bedtime as needed.    Date of patient request: 10/06/21 Last office visit: 06/24/21 Date of last refill: 01/16/21, 10/17/20 Last refill amount: 60 3R, 180 1R

## 2021-10-31 ENCOUNTER — Telehealth: Payer: Self-pay | Admitting: Family Medicine

## 2021-10-31 DIAGNOSIS — F900 Attention-deficit hyperactivity disorder, predominantly inattentive type: Secondary | ICD-10-CM

## 2021-10-31 MED ORDER — AMPHETAMINE-DEXTROAMPHET ER 20 MG PO CP24: 60.0000 mg | ORAL_CAPSULE | Freq: Every day | ORAL | 0 refills | Status: DC

## 2021-10-31 NOTE — Telephone Encounter (Signed)
Patient is requesting a refill of the following medications: Requested Prescriptions    No prescriptions requested or ordered in this encounter    Date of patient request: 10/31/2021 Last office visit: 04/23/2021 Date of last refill: 09/28/2021 Last refill amount: 90 capsules  Follow up time period per chart: none

## 2021-10-31 NOTE — Telephone Encounter (Signed)
° °  Last visit to discuss ADD medication in August.  Requested appointment to be scheduled at phone note last month.  I do not see where that has been scheduled yet.  Will need to have appointment prior to further refills.  Single refill entered to be filled 11/04/21. (Controlled substance database reviewed.  Last filled #90 on 10/05/2021).  Please schedule appointment.

## 2021-10-31 NOTE — Telephone Encounter (Signed)
Pt called in asking for a new script on the [facility] on [address] and [address].   She need the adderral refilled she would like to pick this up on 11/02/21

## 2021-11-03 MED ORDER — AMPHETAMINE-DEXTROAMPHET ER 20 MG PO CP24: 60.0000 mg | ORAL_CAPSULE | Freq: Every day | ORAL | 0 refills | Status: DC

## 2021-11-03 NOTE — Telephone Encounter (Signed)
I have LM asking pt to call back to schedule an appt with Carlota Raspberry

## 2021-11-03 NOTE — Telephone Encounter (Signed)
Pt called in stating that [facility] has the adderral 20 mg [name] is out.   Can we send this in there for her?  Please advise

## 2021-11-03 NOTE — Telephone Encounter (Signed)
New Rx sent to [facility], called [name] and cancelled Rx sent on 10/31/21.

## 2021-11-20 ENCOUNTER — Ambulatory Visit: Payer: Self-pay | Admitting: Family Medicine

## 2021-11-26 ENCOUNTER — Ambulatory Visit: Payer: Self-pay | Admitting: Family Medicine

## 2021-12-04 ENCOUNTER — Telehealth: Payer: Self-pay

## 2021-12-04 ENCOUNTER — Telehealth (INDEPENDENT_AMBULATORY_CARE_PROVIDER_SITE_OTHER): Payer: PRIVATE HEALTH INSURANCE | Admitting: Family Medicine

## 2021-12-04 ENCOUNTER — Encounter: Payer: Self-pay | Admitting: Family Medicine

## 2021-12-04 DIAGNOSIS — F332 Major depressive disorder, recurrent severe without psychotic features: Secondary | ICD-10-CM | POA: Diagnosis not present

## 2021-12-04 DIAGNOSIS — F5104 Psychophysiologic insomnia: Secondary | ICD-10-CM | POA: Diagnosis not present

## 2021-12-04 DIAGNOSIS — F900 Attention-deficit hyperactivity disorder, predominantly inattentive type: Secondary | ICD-10-CM | POA: Diagnosis not present

## 2021-12-04 MED ORDER — TRAZODONE HCL 50 MG PO TABS: 25.0000 mg | ORAL_TABLET | Freq: Every evening | ORAL | 1 refills | Status: DC | PRN

## 2021-12-04 MED ORDER — AMPHETAMINE-DEXTROAMPHET ER 20 MG PO CP24: 60.0000 mg | ORAL_CAPSULE | Freq: Every day | ORAL | 0 refills | Status: DC

## 2021-12-04 MED ORDER — DULOXETINE HCL 30 MG PO CPEP: 90.0000 mg | ORAL_CAPSULE | Freq: Every day | ORAL | 1 refills | Status: DC

## 2021-12-04 NOTE — Telephone Encounter (Signed)
Encourage patient to contact the pharmacy for refills or they can request refills through [facility] ? ?(Please schedule appointment if patient has not been seen in over a year) ? ? ? ?WHAT PHARMACY WOULD THEY LIKE THIS SENT TO: [facility]  ? ?MEDICATION NAME & DOSE:amphetamine-dextroamphetamine (ADDERALL XR) 20 MG 24 hr capsule [[id number]]  ? ?NOTES/COMMENTS FROM PATIENT:Pt is coming in this afternoon but pharmacy on has so much on file can this be sent today?  ? ? ? ? ? ?[place] office please notify patient: ?It takes 48-72 hours to process rx refill requests ?Ask patient to call pharmacy to ensure rx is ready before heading there.  ? ?

## 2021-12-04 NOTE — Telephone Encounter (Signed)
Pt is unsure if she is going to be able to do the appt this afternoon virtual due to having car trouble and not having any power at home. She wanted to know if her script could be sent in to the pharmacy.  ? ?Please advise  ?

## 2021-12-04 NOTE — Progress Notes (Signed)
Virtual Visit via Telephone Note Declined video visit as not enough charge on phone.  I connected with Crystal Williamson on 12/04/21 at 5:07 PM by telephone and verified that I am speaking with the correct person using two identifiers. Patient location: home in car, not driving. by self.  My location: office - [place].    I discussed the limitations, risks, security and privacy concerns of performing an evaluation and management service by telephone and the availability of in person appointments. I also discussed with the patient that there may be a patient responsible charge related to this service. The patient expressed understanding and agreed to proceed, consent obtained  Chief complaint: Chief Complaint  Patient presents with   Depression    Medication is working well for her, patient has no concerns       History of Present Illness:  Depression: Treated with Cymbalta. 30 mg daily.  Meds working well at current dose.  Denies suicidal ideation.  No new side effects with current meds. Remote history of admission to behavioral health for major depressive disorder in 2019. Doing well, in spite of challenges. Having to find new place to live as electric not available. Using indoor space heater. Has contacted housing authority, and DSS.  Joined [employer] - using shower there.  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  Not taking hydroxyzine. Trazodone for sleep - 1/4-1 pill nightly working well.  Has been meeting with chiropractor with DDD of neck with brachial neuropathy.  I am no longer in network with her insurance.   Depression screen [facility] 2/9 12/04/2021 04/23/2021 01/16/2021 10/17/2020 07/31/2020  Decreased Interest 0 1 0 0 2  Down, Depressed, Hopeless 0 1 1 0 2  PHQ - 2 Score 0 2 1 0 4  Altered sleeping 0 1 1 - 2  Tired, decreased energy 0 3 1 - 3  Change in appetite 0 0 2 - 2  Feeling bad or failure about yourself  0 1 1 - 1  Trouble  concentrating 0 1 1 - 2  Moving slowly or fidgety/restless 0 0 0 - 0  Suicidal thoughts 0 0 0 - 0  PHQ-9 Score 0 8 7 - 14  Difficult doing work/chores - - - - -  Some recent data might be hidden    Attention deficit disorder Treated with Adderall 60 mg total dose per day.  Last discussed in August 2022.  Stable symptoms at that time, continue same dose. Last urine drug screen 03/17/2021, positive for amphetamines, positive for ethyl glucuronide, alcohol.  Discussed at follow-up visit in August, alcohol 1 to 2 glasses of wine, every day to every other day, sometimes not at all, 6-7 drinks per week at that time.  Did admit to possibly increased alcohol night prior to urine drug screen.  Denied problem drinking at that time or need for assistance.   Stable symptoms,  adderall working well without heart palpitations, appetite suppression, insomnia, or weight loss.  Some weight gain with fast food. Controlled substance database (PDMP) reviewed. No concerns appreciated.  Needs rx sent to [facility] now, but possibly at [facility] next fill.    Elevated LFTs Previous ALT 42, AST 25 in June 2022, repeat testing in August normalized with AST 24, ALT 32. Alcohol - few glasses wine per night, some nights none. Usually 6 drinks per week.       Patient Active Problem List   Diagnosis Date Noted   Postmenopausal hormone replacement therapy  02/22/2018   MDD (major depressive disorder), recurrent severe, without psychosis (HCC) 11/09/2017   Persistent depressive disorder with anxious distress, currently severe    Overdose 11/06/2017   Alopecia 07/19/2012   Adrenal gland cyst (HCC) 05/20/2012   HEADACHE 08/06/2009   KERATOSIS PILARIS 06/14/2009   HYPERGLYCEMIA 02/22/2009   DERMATOPHYTOSIS OF NAIL 02/14/2009   OBESITY 02/14/2009   SEBORRHEIC KERATOSIS 02/14/2009   LOW BACK PAIN, CHRONIC 02/14/2009   FATIGUE 02/14/2009   Attention deficit hyperactivity disorder (ADHD) 02/14/2009   Past Medical  History:  Diagnosis Date   Allergy    Depression    Fibroid    Headache(784.0)    No pertinent past medical history    Varicose veins    bilateral   Past Surgical History:  Procedure Laterality Date   ABDOMINAL HYSTERECTOMY  06/01/2012   Procedure: HYSTERECTOMY ABDOMINAL;  Surgeon: Ok Edwards, MD;  Location: [facility];  Service: Gynecology;  Laterality: N/A;   SEPTOPLASTY     VARICOSE VEIN SURGERY     WISDOM TOOTH EXTRACTION     Allergies  Allergen Reactions   Codeine Itching    REACTION: N \\T \ V   Hydromorphone Itching   Opium    Percocet [Oxycodone-Acetaminophen] Itching   Tramadol Nausea And Vomiting   Prior to Admission medications   Medication Sig Start Date End Date Taking? Authorizing Provider  amphetamine-dextroamphetamine (ADDERALL XR) 20 MG 24 hr capsule Take 3 capsules (60 mg total) by mouth daily. 11/03/21  Yes Shade Flood, MD  cyclobenzaprine (FLEXERIL) 5 MG tablet Take 1-2 tablets (5-10 mg total) by mouth 3 (three) times daily as needed for muscle spasms. 04/23/21  Yes Shade Flood, MD  DULoxetine (CYMBALTA) 30 MG capsule TAKE THREE CAPSULES BY MOUTH DAILY 09/12/21  Yes Shade Flood, MD  hydrOXYzine (ATARAX) 25 MG tablet TAKE 1 TABLET BY MOUTH AT BEDTIME AS NEEDED FOR ANXIETY 10/07/21  Yes Shade Flood, MD  benzonatate (TESSALON) 100 MG capsule Take 1 capsule (100 mg total) by mouth 3 (three) times daily as needed for cough. Patient not taking: Reported on 12/04/2021 06/11/21   Shade Flood, MD  Estradiol (ESTROGEL) 0.75 MG/1.25 GM (0.06%) topical gel Place 1.25 g onto the skin daily. As needed, lowest effective dose. Patient not taking: Reported on 12/04/2021 07/24/21   Shade Flood, MD  predniSONE (STERAPRED UNI-PAK 21 TAB) 10 MG (21) TBPK tablet Take per package instructions. Do not skip doses. Finish entire supply. Patient not taking: Reported on 12/04/2021 06/24/21   Janeece Agee, NP  traZODone (DESYREL) 50 MG tablet Take 0.5-1  tablets (25-50 mg total) by mouth at bedtime as needed. Patient not taking: Reported on 12/04/2021 10/07/21   Shade Flood, MD   Social History   Socioeconomic History   Marital status: Widowed    Spouse name: Not on file   Number of children: Not on file   Years of education: Not on file   Highest education level: Not on file  Occupational History   Not on file  Tobacco Use   Smoking status: Every Day    Packs/day: 0.25    Types: Cigarettes   Smokeless tobacco: Never   Tobacco comments:    5 cig a day  Substance and Sexual Activity   Alcohol use: Yes    Alcohol/week: 0.0 standard drinks    Comment: 3-4x week    Drug use: No   Sexual activity: Not Currently    Birth control/protection: None, Surgical  Other  Topics Concern   Not on file  Social History Narrative   ** Merged History Encounter **       Social Determinants of Health   Financial Resource Strain: Not on file  Food Insecurity: Not on file  Transportation Needs: Not on file  Physical Activity: Not on file  Stress: Not on file  Social Connections: Not on file  Intimate Partner Violence: Not on file     Observations/Objective: There were no vitals filed for this visit. Normal speech, appropriate responses, no distress on phone call.  Euthymic mood.  No SI/HI, and does not appear to be responding to internal stimuli.  All questions were answered with understanding plan expressed  Assessment and Plan: MDD (major depressive disorder), recurrent severe, without psychosis (HCC) - Plan: DULoxetine (CYMBALTA) 30 MG capsule  Psychophysiological insomnia - Plan: traZODone (DESYREL) 50 MG tablet  Attention deficit hyperactivity disorder (ADHD), predominantly inattentive type - Plan: amphetamine-dextroamphetamine (ADDERALL XR) 20 MG 24 hr capsule  Depression controlled, continue same regimen with Cymbalta.  Continue to monitor alcohol intake, LFTs normalized last visit.  Trazodone continues for sleep with lowest  effective dose.  Overall doing well in spite of social stressors, housing difficulty.  Discussed Housing Authority for resources but she has already checked with them without specific assistance.  Currently pursuing other options.  ADD well controlled with current regimen of Adderall 60 mg, no new side effects.  Refilled for this month, then if back in stock at [facility] can switch to [facility] for next prescriptions.  51-month in person follow-up discussed.  With insurance change there is a possibility she may need to change PCP.  She will let me know.  Follow Up Instructions: 6 months in office if she has not reestablished with other primary care provider due to insurance coverage.   I discussed the assessment and treatment plan with the patient. The patient was provided an opportunity to ask questions and all were answered. The patient agreed with the plan and demonstrated an understanding of the instructions.   The patient was advised to call back or seek an in-person evaluation if the symptoms worsen or if the condition fails to improve as anticipated.  I provided 21 minutes of non-face-to-face time during this encounter.  Signed,   Meredith Staggers, MD Primary Care at [facility].  12/04/21

## 2021-12-04 NOTE — Patient Instructions (Addendum)
Follow up to discuss neck and arm symptoms if you would like to discuss that further. ? ?

## 2021-12-05 NOTE — Telephone Encounter (Signed)
Virtual visit completed.

## 2021-12-22 ENCOUNTER — Telehealth: Payer: Self-pay

## 2021-12-22 DIAGNOSIS — F900 Attention-deficit hyperactivity disorder, predominantly inattentive type: Secondary | ICD-10-CM

## 2021-12-22 NOTE — Telephone Encounter (Signed)
MEDICATION: amphetamine-dextroamphetamine (ADDERALL XR) 20 MG 24 hr capsule ? ?PHARMACY: [facility] Address: [address], [place], [place] [address] ? ?Comments: Patient states that she was only able to afford 80 pills vs. 90 pills. She is not due for the other 10 pills until the 8th, but is asking if we can send in the other 10 pills while supplies last to [place].  ? ?**Let patient know to contact pharmacy at the end of the day to make sure medication is ready. ** ? ?** Please notify patient to allow 48-72 hours to process** ? ?**Encourage patient to contact the pharmacy for refills or they can request refills through [facility]** ? ? ?

## 2021-12-22 NOTE — Telephone Encounter (Signed)
Verified with pharmacy,  pt received 80 pills.  ?Can refill 10 tablets early or should she wait? I have not heard any updates on back order for this medication at this time  ?

## 2021-12-23 ENCOUNTER — Telehealth: Payer: Self-pay

## 2021-12-23 DIAGNOSIS — I872 Venous insufficiency (chronic) (peripheral): Secondary | ICD-10-CM

## 2021-12-23 MED ORDER — AMPHETAMINE-DEXTROAMPHET ER 20 MG PO CP24: 60.0000 mg | ORAL_CAPSULE | Freq: Every day | ORAL | 0 refills | Status: DC

## 2021-12-23 NOTE — Telephone Encounter (Signed)
Patient called in wanting a referral to a neurologist for her neuropathy. Asked for it to go to Dr. Zada Finders at [facility].  ?

## 2021-12-23 NOTE — Telephone Encounter (Signed)
Controlled substance database (PDMP) reviewed.  Last filled #80 tablets on 12/04/2021.  Will need to fill medication 4 days early, and as 31 days in March should be able to be filled 12/30/21.  Prescription sent to [facility] for additional 10 days, and then new rx for 4/15 fill date.  ?

## 2021-12-24 NOTE — Telephone Encounter (Signed)
Caller states she was calling back. She needs a referral [facility] specialist. ? ?

## 2021-12-25 NOTE — Telephone Encounter (Signed)
Patient states she would like a referral to Neurologist for Neuropathy to DR. Ostergard at [facility]. Also a referral to [place] vein specialist. The vein specialist was discussed in visit in 8/22  ?

## 2021-12-27 NOTE — Telephone Encounter (Signed)
She did note at her March 16 video visit that she had been meeting with a chiropractor for degenerative disc disease of her neck and a possible neuropathy but we did not discuss that further, or exam.  I did advise follow-up if she was continuing to have issues or did need referral.  Please schedule visit, preferably in person so we can evaluate that further and decide on next step, including treatments I may be able to prescribe as well as neurology versus neurosurgery.  ? ?I will place a referral to vascular as discussed in August. ?

## 2021-12-27 NOTE — Addendum Note (Signed)
Addended by: Merri Ray R on: 12/27/2021 04:17 PM ? ? Modules accepted: Orders ? ?

## 2022-01-06 ENCOUNTER — Telehealth: Payer: Self-pay

## 2022-01-06 NOTE — Telephone Encounter (Signed)
Pt called in asking for a referral to Neurologist for Neuropathy to DR. Ostergard at [facility]. Please advise  ? ?Pt is aware that Crystal Williamson isn't in the office today but would like this done asap.  ?

## 2022-01-06 NOTE — Telephone Encounter (Signed)
Pt requesting new referral, okay to send or should pt return for visit? ? ?

## 2022-01-06 NOTE — Telephone Encounter (Signed)
Pt called in asking about referral to vascular due to worsening sxs.  ?

## 2022-01-06 NOTE — Telephone Encounter (Signed)
Please see my prior note 10 days ago. Did this info get relayed to pt? Referral to neurosurgery should not be a problem, but I need to gather more info first so I can refer appropriately. We had planned on follow up visit to discuss neck and symptoms. This was briefly discussed at her video visit and was under care of chiropracter. Can we request their notes or imaging that has been performed - that may be helpful for visit and referral. Thanks.  ?

## 2022-01-07 NOTE — Telephone Encounter (Signed)
Need name of provider for chiropractic so we can request records  ?

## 2022-01-07 NOTE — Telephone Encounter (Signed)
LM to call back, pt is asking about referral Dr Carlota Raspberry has requested she follow up in office to discuss sxs and referral due to not being able to extensively cover this at previous visits.  ? ?In office for physical exam please  ?

## 2022-01-08 NOTE — Telephone Encounter (Signed)
Pt returned phone call.  

## 2022-01-09 ENCOUNTER — Encounter: Payer: Self-pay | Admitting: Family Medicine

## 2022-01-09 ENCOUNTER — Telehealth (INDEPENDENT_AMBULATORY_CARE_PROVIDER_SITE_OTHER): Payer: PRIVATE HEALTH INSURANCE | Admitting: Family Medicine

## 2022-01-09 DIAGNOSIS — M5412 Radiculopathy, cervical region: Secondary | ICD-10-CM | POA: Diagnosis not present

## 2022-01-09 DIAGNOSIS — M542 Cervicalgia: Secondary | ICD-10-CM | POA: Diagnosis not present

## 2022-01-09 DIAGNOSIS — R29898 Other symptoms and signs involving the musculoskeletal system: Secondary | ICD-10-CM | POA: Diagnosis not present

## 2022-01-09 DIAGNOSIS — F332 Major depressive disorder, recurrent severe without psychotic features: Secondary | ICD-10-CM

## 2022-01-09 DIAGNOSIS — F900 Attention-deficit hyperactivity disorder, predominantly inattentive type: Secondary | ICD-10-CM

## 2022-01-09 MED ORDER — GABAPENTIN 100 MG PO CAPS: 100.0000 mg | ORAL_CAPSULE | Freq: Every day | ORAL | 0 refills | Status: DC

## 2022-01-09 NOTE — Progress Notes (Signed)
? ?Virtual Visit via Telephone Note ? ?I connected with Crystal Williamson on 01/09/22 at 11:19 AM ?by telephone and verified that I am speaking with the correct person using two identifiers. ?Patient location: in car, by self  ?My location: office - [place].  ?  ?I discussed the limitations, risks, security and privacy concerns of performing an evaluation and management service by telephone and the availability of in person appointments. I also discussed with the patient that there may be a patient responsible charge related to this service. The patient expressed understanding and agreed to proceed, consent obtained ? ?Chief complaint: ?Chief Complaint  ?Patient presents with  ? Referral  ?  Discuss referral to neuro and spine specialist pt reports pain has increased Dr Franky Macho preferred   ? ?History of Present Illness: ? ?Requests neurosurgery referral: ?She has been under the care of chiropractor for some neck issues and reported brachial neuropathy. Has been noted to have degenerative disc disease of her neck.  ?Followed by [facility] for past 15 years. ?Has been treated for degenerative disc issues in lower back and some neck issues over the years. Worse recently.  ?2 years ago started with numbness in thumb and index finger left hand. progressed past 9 months. Increasing pain into left hand - feels like hand in deep fryer.  ?Lost feeling in thumb, index finger, tip of middle finger, and now into 4th finger. Worse pain at night. More numbness past few weeks into left hand. Pain shooting down left wrist and into left thumb. Left hand symptoms persistent during day now.  ?Shoulder pain in R side few months ago - comes and goes, now with numbness in tips of fingers of R hand past week or two at night only. ?Told by chiropracter that had brachial neuropathy, recommended MRI and to see Dr. Zada Finders.  ?Dropping objects at times- more recently - past few months. Feels like favoring hands - hurts to grip,  trouble opening bottles. Feels like left hand getting weak past few months. ?Did not tolerate ultram in past - felt crazy, itchy, redness.   ?  ?Also requesting additional adderall Rx. Would like to meet with psychiatry to discuss options. On max dose adderall XR. Last filled 01/03/22 by PDMP review.  Trouble sleeping d/t pain in neck, hands. Waking up with pain. Able to go to sleep with trazodone, but wakes up d/t pain in hands.  ?No prior gabapentin.  ? ? ?  ?Patient Active Problem List  ? Diagnosis Date Noted  ? Postmenopausal hormone replacement therapy 02/22/2018  ? MDD (major depressive disorder), recurrent severe, without psychosis ([place]) 11/09/2017  ? Persistent depressive disorder with anxious distress, currently severe   ? Overdose 11/06/2017  ? Alopecia 07/19/2012  ? Adrenal gland cyst ([place]) 05/20/2012  ? HEADACHE 08/06/2009  ? KERATOSIS PILARIS 06/14/2009  ? HYPERGLYCEMIA 02/22/2009  ? DERMATOPHYTOSIS OF NAIL 02/14/2009  ? OBESITY 02/14/2009  ? SEBORRHEIC KERATOSIS 02/14/2009  ? LOW BACK PAIN, CHRONIC 02/14/2009  ? FATIGUE 02/14/2009  ? Attention deficit hyperactivity disorder (ADHD) 02/14/2009  ? ?Past Medical History:  ?Diagnosis Date  ? Allergy   ? Depression   ? Fibroid   ? Headache(784.0)   ? No pertinent past medical history   ? Varicose veins   ? bilateral  ? ?Past Surgical History:  ?Procedure Laterality Date  ? ABDOMINAL HYSTERECTOMY  06/01/2012  ? Procedure: HYSTERECTOMY ABDOMINAL;  Surgeon: Terrance Mass, MD;  Location: [place] [facility];  Service: Gynecology;  Laterality: N/A;  ?  SEPTOPLASTY    ? VARICOSE VEIN SURGERY    ? WISDOM TOOTH EXTRACTION    ? ?Allergies  ?Allergen Reactions  ? Codeine Itching  ?  REACTION: N' \\T'$ \ V  ? Hydromorphone Itching  ? Opium   ? Percocet [Oxycodone-Acetaminophen] Itching  ? Tramadol Nausea And Vomiting  ? ?Prior to Admission medications   ?Medication Sig Start Date End Date Taking? Authorizing Provider  ?amphetamine-dextroamphetamine (ADDERALL XR) 20 MG 24 hr capsule Take  3 capsules (60 mg total) by mouth daily. 12/23/21  Yes Wendie Agreste, MD  ?amphetamine-dextroamphetamine (ADDERALL XR) 20 MG 24 hr capsule Take 3 capsules (60 mg total) by mouth daily. 12/23/21  Yes Wendie Agreste, MD  ?cyclobenzaprine (FLEXERIL) 5 MG tablet Take 1-2 tablets (5-10 mg total) by mouth 3 (three) times daily as needed for muscle spasms. 04/23/21  Yes Wendie Agreste, MD  ?DULoxetine (CYMBALTA) 30 MG capsule Take 3 capsules (90 mg total) by mouth daily. 12/04/21  Yes Wendie Agreste, MD  ?hydrOXYzine (ATARAX) 25 MG tablet TAKE 1 TABLET BY MOUTH AT BEDTIME AS NEEDED FOR ANXIETY 10/07/21  Yes Wendie Agreste, MD  ?traZODone (DESYREL) 50 MG tablet Take 0.5-1 tablets (25-50 mg total) by mouth at bedtime as needed. 12/04/21  Yes Wendie Agreste, MD  ? ?Social History  ? ?Socioeconomic History  ? Marital status: Widowed  ?  Spouse name: Not on file  ? Number of children: Not on file  ? Years of education: Not on file  ? Highest education level: Not on file  ?Occupational History  ? Not on file  ?Tobacco Use  ? Smoking status: Every Day  ?  Packs/day: 0.25  ?  Types: Cigarettes  ? Smokeless tobacco: Never  ? Tobacco comments:  ?  5 cig a day  ?Substance and Sexual Activity  ? Alcohol use: Yes  ?  Alcohol/week: 0.0 standard drinks  ?  Comment: 3-4x week   ? Drug use: No  ? Sexual activity: Not Currently  ?  Birth control/protection: None, Surgical  ?Other Topics Concern  ? Not on file  ?Social History Narrative  ? ** Merged History Encounter **  ?    ? ?Social Determinants of Health  ? ?Financial Resource Strain: Not on file  ?Food Insecurity: Not on file  ?Transportation Needs: Not on file  ?Physical Activity: Not on file  ?Stress: Not on file  ?Social Connections: Not on file  ?Intimate Partner Violence: Not on file  ? ? ? ?Observations/Objective: ?There were no vitals filed for this visit. ?Speaking in full sentences without apparent distress on phone.  Coherent responses.  All questions were  answered. ? ?Assessment and Plan: ?Neck pain - Plan: MR Cervical Spine Wo Contrast, Ambulatory referral to Neurosurgery ?Cervical radiculopathy - Plan: MR Cervical Spine Wo Contrast, Ambulatory referral to Neurosurgery ?Left hand weakness - Plan: MR Cervical Spine Wo Contrast, Ambulatory referral to Neurosurgery ? -Progressive neck pain, radicular symptoms by history, now with possible intermittent weakness of left hand.  Check MRI cervical spine, refer to neurosurgeon.  Trial of gabapentin low-dose at bedtime, cautioned on added side effects with her other medications and may need to hold or lower dose of trazodone initially.  RTC and ER precautions were given. ? ?MDD (major depressive disorder), recurrent severe, without psychosis ([place]) ?Attention deficit hyperactivity disorder (ADHD), predominantly inattentive type ? -On max dosing of Adderall XR, requesting additional dosage.  Suspect some of this may be due to fatigue from sleep issues  above with neck symptoms.  Gabapentin prescribed as above.  Will refer to psychiatry to evaluate medication regimen.  No additional dosage of stimulant at this time as max dosing currently. ? ? ?Follow Up Instructions: ?As needed with ER precautions. ?  ?I discussed the assessment and treatment plan with the patient. The patient was provided an opportunity to ask questions and all were answered. The patient agreed with the plan and demonstrated an understanding of the instructions. ?  ?The patient was advised to call back or seek an in-person evaluation if the symptoms worsen or if the condition fails to improve as anticipated. ? ?I provided 31 minutes of non-face-to-face time during this encounter. ? ?Signed,  ? ?Merri Ray, MD ?Primary Care at [facility] ?[facility].  ?01/09/22 ? ? ? ?

## 2022-01-18 ENCOUNTER — Other Ambulatory Visit: Payer: PRIVATE HEALTH INSURANCE

## 2022-01-19 ENCOUNTER — Telehealth: Payer: Self-pay | Admitting: Family Medicine

## 2022-01-19 NOTE — Telephone Encounter (Signed)
Pt is aware that the mri was denied, she states that she is still having the pain/no feeling in her hand and fingers. She does have the # to call the neurosurgereon's office to schedule an appt there.  ? ?She wanted Dr. Carlota Raspberry to know that she tired to take the Gabapentin but didn't like the way it made her feel the next day.  ? ?  ?

## 2022-01-20 NOTE — Telephone Encounter (Signed)
Unfortunately MRI was not covered.  I will work on trying to contact insurance to see if we can provide additional information to have it covered.  If she did not tolerate gabapentin, no new medication for now but let me know if there are questions in the meantime. ?

## 2022-01-21 NOTE — Telephone Encounter (Signed)
Pt reports that she did take one dose of gabapentin and reports felt extremely foggy the next day, declined continued use after that.  ? ?Pt does report 2 years of treatment at [facility],  Dr Lucrezia Starch, could request records and attempt to submit those notes  ?

## 2022-01-21 NOTE — Telephone Encounter (Signed)
Called LM to have return call to discuss effectiveness of gabapentin give update on MRI ?

## 2022-01-22 ENCOUNTER — Telehealth: Payer: Self-pay

## 2022-01-22 NOTE — Telephone Encounter (Signed)
Patient is calling in stating that her neuropathy has gotten worse. [employer] never got a PA authorized so they canceled and didn't reschedule. She said she is in pain 24/7 now, unable to to take Gabapentin wondering if she can get a work letter that puts her out temporarily or wants to know what we can do next.  ?

## 2022-01-22 NOTE — Telephone Encounter (Addendum)
Patient called back in about message below. She is very tearful saying that she needs to call out tomorrow but doesn't want to be penalized. Asking for an update, advised patient that Dr.Greene is running behind today with his patients and that the turn around for messages is 24-48 hours. Damani then states that she doesn't know what to do and asked for my advice. Explained that I would send a message back again and speak more with Noah Delaine and to get back with her.  ?

## 2022-01-22 NOTE — Telephone Encounter (Signed)
Agree with visit tomorrow.  I am sorry to hear she is in pain.  Hopefully we can come up with some options for her tomorrow.  Was hoping that the gabapentin would provide some relief but unfortunately it appears she was not able to tolerate it.  We discussed other options at her last visit April 21 including tramadol but she felt itching, redness and some possible psychological symptoms with that medication, and allergic to opioids.  We may decide to try Lyrica versus prednisone tomorrow and will see what options are for imaging given worsening symptoms. May be able to get that covered now with a peer to peer discussion. ?

## 2022-01-22 NOTE — Telephone Encounter (Signed)
Pt has been scheduled for a follow up 01/23/22 for uncontrolled pain ?

## 2022-01-22 NOTE — Telephone Encounter (Signed)
I am not sure we can submit other providers notes for that study but certainly I could review them to provide more information from [place] if needed.  I think requesting those notes would be helpful. ?

## 2022-01-23 ENCOUNTER — Other Ambulatory Visit: Payer: Self-pay

## 2022-01-23 ENCOUNTER — Telehealth: Payer: Self-pay

## 2022-01-23 ENCOUNTER — Ambulatory Visit (INDEPENDENT_AMBULATORY_CARE_PROVIDER_SITE_OTHER): Payer: Managed Care, Other (non HMO) | Admitting: Family Medicine

## 2022-01-23 VITALS — BP 118/78 | HR 80 | Temp 98.3°F | Resp 18 | Ht 68.0 in | Wt 211.0 lb

## 2022-01-23 DIAGNOSIS — M25532 Pain in left wrist: Secondary | ICD-10-CM | POA: Diagnosis not present

## 2022-01-23 DIAGNOSIS — R200 Anesthesia of skin: Secondary | ICD-10-CM | POA: Diagnosis not present

## 2022-01-23 DIAGNOSIS — F900 Attention-deficit hyperactivity disorder, predominantly inattentive type: Secondary | ICD-10-CM

## 2022-01-23 DIAGNOSIS — R208 Other disturbances of skin sensation: Secondary | ICD-10-CM

## 2022-01-23 DIAGNOSIS — M542 Cervicalgia: Secondary | ICD-10-CM | POA: Diagnosis not present

## 2022-01-23 MED ORDER — PREDNISONE 20 MG PO TABS: ORAL_TABLET | ORAL | 0 refills | Status: AC

## 2022-01-23 MED ORDER — PREDNISONE 20 MG PO TABS: ORAL_TABLET | ORAL | 0 refills | Status: DC

## 2022-01-23 MED ORDER — PREGABALIN 25 MG PO CAPS: 25.0000 mg | ORAL_CAPSULE | Freq: Two times a day (BID) | ORAL | 0 refills | Status: DC

## 2022-01-23 MED ORDER — AMPHETAMINE-DEXTROAMPHET ER 20 MG PO CP24: 60.0000 mg | ORAL_CAPSULE | Freq: Every day | ORAL | 0 refills | Status: DC

## 2022-01-23 NOTE — Patient Instructions (Addendum)
If using topical diclofenac, stop the alleve.  ?Try to avoid overuse of wrists/hands. Out of work next few days.  ?Start prednisone. Let me know how that is doing into next week. Lyrica is an option if continued burning pain - stop if new side effects.  ?Xray at [facility].  ?Keep follow up with neurosurgeon as planned. ?Return to the clinic or go to the nearest emergency room if any of your symptoms worsen or new symptoms occur. ? ?[facility] for xray - today if possible.  ?Walk in 8:30-4:30 during weekdays, no appointment needed ?[place]  ?[place], [place] [address] ? ?Please discuss Vyvanse question or any med changes for ADD at upcoming psychiatry appointment.  I can refill the Adderall until that visit.  Prescription printed for next refill.  ? ?If you have lab work done today you will be contacted with your lab results within the next 2 weeks.  If you have not heard from [place] then please contact us. The fastest way to get your results is to register for My Chart. ? ? ?IF you received an x-ray today, you will receive an invoice from [facility]. Please contact [facility] at [phone] with questions or concerns regarding your invoice.  ? ?IF you received labwork today, you will receive an invoice from [facility]. Please contact [facility] at [phone] with questions or concerns regarding your invoice.  ? ?Our billing staff will not be able to assist you with questions regarding bills from these companies. ? ?You will be contacted with the lab results as soon as they are available. The fastest way to get your results is to activate your My Chart account. Instructions are located on the last page of this paperwork. If you have not heard from [place] regarding the results in 2 weeks, please contact this office. ?  ? ? ?

## 2022-01-23 NOTE — Telephone Encounter (Addendum)
Patient is calling in stating that she would like for her prescription to be sent to [facility] on [address].  ?

## 2022-01-23 NOTE — Telephone Encounter (Signed)
Patient was seen in the office today for these concerns  ?

## 2022-01-23 NOTE — Progress Notes (Signed)
? ?Subjective:  ?Patient ID: Crystal Williamson, female    DOB: 1966/04/07  Age: 56 y.o. MRN: [id number] ? ?CC:  ?Chief Complaint  ?Patient presents with  ? Pain  ?  Patient states she is still having some hand pain and has no feeling in 4 fingers on left hand starting move up her arm  ? ? ?HPI ?Crystal Williamson presents for  ?Left arm pain, neck pain, dysesthesias: ?See last visit, video visit on April 21.  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  Has been noted to have degenerative disc disease of her neck.  Had also been treated for degenerative disc issues and lower back and neck issues over the years.  Had noticed numbness in her thumb and index finger of her left hand approximately 2 years ago but had worsened in the preceding 9 months with increasing pain into her left hand and dysesthesias.  She also reported losing feeling her thumb index finger tip of her middle finger and then on fourth finger with worse pain at night.  Had noticed more numbness in the preceding few weeks.  Pain shooting down her left wrist into her thumb and had persistent symptoms throughout the day.  She had also reported some numbness in the tips of her fingers of right hand for a week or 2 at night as of her last visit  MRI and neurosurgery referral has been recommended by her chiropractor, and I did order those last visit.  MRI was not authorized as recommended 6 weeks of directed therapy from provider prior to covering MRI. ?She has not tolerated Ultram in the past and has an allergy to narcotics.  We did try gabapentin last visit for possible neurologic cause, but unfortunately caused too much sedation - confusion, dizzy. Did help the pain, but too many side effects.  ?No recent neck rays, no injections. Only slight soreness in neck. Neuropathy feeling with loss of feeling in fingertips on left, now with loss of feeling in fingertips in R fingertips at night only. Feels like  left hand in deep fryer.  ?Since last visit - pain has moved up wrist to lower forearm. More loss of feeling in fingers - bigger area. Sore and feels weak to grip. Dropping objects at times - feels worse.  ?Using hands more with recent move - past week. No rash.  ?Tx: alleve everyday - 2-3 per day. Min relief, diclofenac topical - 5-10 times per day. Feels better that oral alleve.  ?Does not care for tylenol.  ?Alcohol: 1/2 bottle wine per night - sometimes less or more. Trazodone for sleep.  ?Has taken prednisone in past without side effects. No recent prednisone.  ?Appt with neurosurgery 5/17.  ? ?ADD: ?Discussed at her last video visit.  She was on maximum dose Adderall XR.  Referred to psychiatry as she reported incomplete relief of symptoms at that dose. ?Thinking about trying Vyvanse. Friend takes it. Psychiatry appointment in June.  ?Controlled substance database (PDMP) reviewed.  Last filled dextroamphetamine-amphetamine extended release 20 mg capsules, 3/day, #90 on 01/03/2022. Shortage of meds at [facility] - would like printed rx to check for available locations.  ? ?History ?Patient Active Problem List  ? Diagnosis Date Noted  ? Postmenopausal hormone replacement therapy 02/22/2018  ? MDD (major depressive disorder), recurrent severe, without psychosis ([place]) 11/09/2017  ? Persistent depressive disorder with anxious distress, currently severe   ? Overdose 11/06/2017  ? Alopecia 07/19/2012  ?  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 HEADACHE 08/06/2009  ? KERATOSIS PILARIS 06/14/2009  ? HYPERGLYCEMIA 02/22/2009  ? DERMATOPHYTOSIS OF NAIL 02/14/2009  ? OBESITY 02/14/2009  ? SEBORRHEIC KERATOSIS 02/14/2009  ? LOW BACK PAIN, CHRONIC 02/14/2009  ? FATIGUE 02/14/2009  ? Attention deficit hyperactivity disorder (ADHD) 02/14/2009  ? ?Past Medical History:  ?Diagnosis Date  ? Allergy   ? Depression   ? Fibroid   ? Headache(784.0)   ? No pertinent past medical history   ? Varicose veins   ? bilateral  ? ?Past Surgical  History:  ?Procedure Laterality Date  ? ABDOMINAL HYSTERECTOMY  06/01/2012  ? Procedure: HYSTERECTOMY ABDOMINAL;  Surgeon: Terrance Mass, MD;  Location: [facility];  Service: Gynecology;  Laterality: N/A;  ? SEPTOPLASTY    ? VARICOSE VEIN SURGERY    ? WISDOM TOOTH EXTRACTION    ? ?Allergies  ?Allergen Reactions  ? Codeine Itching  ?  REACTION: N' \\T'$ \ V  ? Hydromorphone Itching  ? Opium   ? Percocet [Oxycodone-Acetaminophen] Itching  ? Tramadol Nausea And Vomiting  ? ?Prior to Admission medications   ?Medication Sig Start Date End Date Taking? Authorizing Provider  ?amphetamine-dextroamphetamine (ADDERALL XR) 20 MG 24 hr capsule Take 3 capsules (60 mg total) by mouth daily. 12/23/21  Yes Wendie Agreste, MD  ?amphetamine-dextroamphetamine (ADDERALL XR) 20 MG 24 hr capsule Take 3 capsules (60 mg total) by mouth daily. 12/23/21  Yes Wendie Agreste, MD  ?cyclobenzaprine (FLEXERIL) 5 MG tablet Take 1-2 tablets (5-10 mg total) by mouth 3 (three) times daily as needed for muscle spasms. 04/23/21  Yes Wendie Agreste, MD  ?DULoxetine (CYMBALTA) 30 MG capsule Take 3 capsules (90 mg total) by mouth daily. 12/04/21  Yes Wendie Agreste, MD  ?gabapentin (NEURONTIN) 100 MG capsule Take 1 capsule (100 mg total) by mouth at bedtime. 01/09/22  Yes Wendie Agreste, MD  ?traZODone (DESYREL) 50 MG tablet Take 0.5-1 tablets (25-50 mg total) by mouth at bedtime as needed. 12/04/21  Yes Wendie Agreste, MD  ?hydrOXYzine (ATARAX) 25 MG tablet TAKE 1 TABLET BY MOUTH AT BEDTIME AS NEEDED FOR ANXIETY ?Patient not taking: Reported on 01/23/2022 10/07/21   Wendie Agreste, MD  ? ?Social History  ? ?Socioeconomic History  ? Marital status: Widowed  ?  Spouse name: Not on file  ? Number of children: Not on file  ? Years of education: Not on file  ? Highest education level: Not on file  ?Occupational History  ? Not on file  ?Tobacco Use  ? Smoking status: Every Day  ?  Packs/day: 0.25  ?  Types: Cigarettes  ? Smokeless tobacco: Never  ? Tobacco  comments:  ?  5 cig a day  ?Substance and Sexual Activity  ? Alcohol use: Yes  ?  Alcohol/week: 0.0 standard drinks  ?  Comment: 3-4x week   ? Drug use: No  ? Sexual activity: Not Currently  ?  Birth control/protection: None, Surgical  ?Other Topics Concern  ? Not on file  ?Social History Narrative  ? ** Merged History Encounter **  ?    ? ?Social Determinants of Health  ? ?Financial Resource Strain: Not on file  ?Food Insecurity: Not on file  ?Transportation Needs: Not on file  ?Physical Activity: Not on file  ?Stress: Not on file  ?Social Connections: Not on file  ?Intimate Partner Violence: Not on file  ? ? ?Review of Systems ?Per HPI.  ? ?Objective:  ? ?Vitals:  ?  01/23/22 1204  ?BP: 118/78  ?Pulse: 80  ?Resp: 18  ?Temp: 98.3 ?F (36.8 ?C)  ?TempSrc: Temporal  ?SpO2: 99%  ?Weight: 211 lb (95.7 kg)  ?Height: '5\' 8"'$  (1.727 m)  ? ? ? ?Physical Exam ?Vitals reviewed.  ?Constitutional:   ?   General: She is not in acute distress. ?   Appearance: Normal appearance. She is well-developed.  ?HENT:  ?   Head: Normocephalic and atraumatic.  ?Cardiovascular:  ?   Rate and Rhythm: Normal rate.  ?Pulmonary:  ?   Effort: Pulmonary effort is normal.  ?Musculoskeletal:  ?   Comments: Cspine: ?Pain in neck with flexion, minimal left rotation and left lateral flexion. No shooting pain into arm with ROM.  ?No pain/dysesthesia with tinel of left brachial plexus.  ?Left shoulder: no bony pain, pain free ROM.  ?Upper arm - no bony ttp ?Elbow: pulling feeling in upper arm with flexion, but from and no bony ttp.  ?Left wrist - FROM, ttp over DRUJ, negative finkelstein.  ?Left hand - no bony ttp. From of phalanges, nl ok sign and resisted finger opposition, intact strength, equal grip strength.  ?Negative tinel at wrist, some increased tingling in 2nd phalynx with Phalens.  ? ?R wrist/hand - no bony ttp, neg tinel/phalen. Min soreness with finkelstein.   ?Neurological:  ?   Mental Status: She is alert and oriented to person, place, and  time.  ?   Deep Tendon Reflexes:  ?   Reflex Scores: ?     Tricep reflexes are 2+ on the right side and 2+ on the left side. ?     Bicep reflexes are 2+ on the right side and 2+ on the left side. ?     Bra

## 2022-01-24 ENCOUNTER — Encounter: Payer: Self-pay | Admitting: Family Medicine

## 2022-01-26 ENCOUNTER — Telehealth: Payer: Self-pay

## 2022-01-26 NOTE — Telephone Encounter (Signed)
Crystal Williamson is calling in stating that she is wanting to know if she can get another refill on the prednisone as it has been helping with the pain. Has noticed that the numbness in the thumb is still there. Asked for the refill for the to be sent to [facility] on [address]. Also wanting [place] to extend her work note for another week due to the numbness.  ?

## 2022-01-27 ENCOUNTER — Ambulatory Visit (INDEPENDENT_AMBULATORY_CARE_PROVIDER_SITE_OTHER)
Admission: RE | Admit: 2022-01-27 | Discharge: 2022-01-27 | Disposition: A | Discharge status: Home / Self Care | Payer: Managed Care, Other (non HMO) | Source: Ambulatory Visit | Attending: Family Medicine | Admitting: Family Medicine

## 2022-01-27 DIAGNOSIS — M25532 Pain in left wrist: Secondary | ICD-10-CM

## 2022-01-27 DIAGNOSIS — M542 Cervicalgia: Secondary | ICD-10-CM | POA: Diagnosis not present

## 2022-01-27 DIAGNOSIS — R200 Anesthesia of skin: Secondary | ICD-10-CM | POA: Diagnosis not present

## 2022-01-27 NOTE — Telephone Encounter (Signed)
Pt notes will continue taper dose, intends to go get XR today will call back as necessary.  ? ?Requested letter extension be mailed to new address  ?[place] [address] ?

## 2022-01-27 NOTE — Telephone Encounter (Signed)
Please advise, I know you had stated she needed to allow more time for recovery, okay to extend note as well? ? ?

## 2022-01-27 NOTE — Telephone Encounter (Signed)
Would like to see how she does with the full course of prednisone for providing refill.  We ordered x-ray last visit, has she had a chance to obtain those yet?  Those results will help with peer to peer review for requesting MRI.  Okay to extend note for now. ?

## 2022-01-28 ENCOUNTER — Telehealth: Payer: Self-pay | Admitting: Family Medicine

## 2022-01-28 ENCOUNTER — Telehealth: Payer: Self-pay

## 2022-01-28 NOTE — Telephone Encounter (Signed)
Pt called in asking for the results from the xray she had done.  ? ? ?

## 2022-01-28 NOTE — Telephone Encounter (Signed)
Pt called in to follow up on results. Please advise when you are able  ?

## 2022-01-28 NOTE — Telephone Encounter (Signed)
Received FMLA paperwork for patient - charge sheet attached and placed in folder up front.  ?

## 2022-01-28 NOTE — Telephone Encounter (Signed)
C spine XR noted: ? ? ?DG Cervical Spine Complete ? ?Result Date: 01/27/2022 ?CLINICAL DATA:  Neck pain.  Left arm pain and dysesthesia EXAM: CERVICAL SPINE - COMPLETE 4+ VIEW COMPARISON:  None Available. FINDINGS: Mild anterolisthesis C4-5 with straightening of the cervical lordosis. Negative for fracture or mass. Prevertebral soft tissues normal Disc degeneration and spurring most prominent at C4-5 and C5-6. Asymmetric facet degeneration on the left at C3-4 and C4-5 Right foraminal narrowing C4-5 and C5-6 appears moderate. Moderate left foraminal narrowing C3-4 and C5-6. Mild left foraminal narrowing C4-5. IMPRESSION: Cervical spondylosis with foraminal narrowing bilaterally left greater than right. Electronically Signed   By: Franchot Gallo M.D.   On: 01/27/2022 12:20  ? ?Called to provide info for peer to peer review. [phone] to discuss the decision with a physician reviewer.   Customer ID [id number].  Case #[id number] ?No reviewers available at this time. Will call tomorrow.  ? ?

## 2022-01-28 NOTE — Telephone Encounter (Signed)
She is also wanted to know when she should return back to work, she states she will need another letter stating this as well. 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  ? ? ?

## 2022-01-29 ENCOUNTER — Telehealth: Payer: Self-pay | Admitting: Family Medicine

## 2022-01-29 ENCOUNTER — Encounter: Payer: Self-pay | Admitting: Family Medicine

## 2022-01-29 NOTE — Telephone Encounter (Signed)
New letter noted 01/27/22 - FMLA paperwork pending.  ?

## 2022-01-29 NOTE — Progress Notes (Signed)
New letter noted from 01/27/22. FMLA paperwork pending.  ?

## 2022-01-29 NOTE — Telephone Encounter (Signed)
Placed in your to be signed folder for review  

## 2022-01-29 NOTE — Telephone Encounter (Signed)
I emailed the return to work note to the pt, she requested it be sent to her so she can return to work this weekend  ?

## 2022-01-29 NOTE — Telephone Encounter (Signed)
Called peer to peer review line.  Advised that I will need to schedule a peer to peer review the specific time and no availability today.  Will be scheduled for 3:45 PM tomorrow, reviewer will call me.  ?I do have FMLA paperwork for patient as well, can complete once I have more information on timing of MRI.  Additionally has appointment with neurosurgery in 6 days. ?

## 2022-01-30 ENCOUNTER — Other Ambulatory Visit: Payer: Self-pay

## 2022-01-30 DIAGNOSIS — F900 Attention-deficit hyperactivity disorder, predominantly inattentive type: Secondary | ICD-10-CM

## 2022-01-30 MED ORDER — AMPHETAMINE-DEXTROAMPHET ER 20 MG PO CP24: 60.0000 mg | ORAL_CAPSULE | Freq: Every day | ORAL | 0 refills | Status: DC

## 2022-01-30 NOTE — Telephone Encounter (Signed)
Controlled substance database reviewed, last filled #90 on 01/03/2022.  Will E-prescribe refill as requested, but please have her bring the paper prescription provided last visit to the office next week. ?

## 2022-01-30 NOTE — Telephone Encounter (Signed)
Pt found stock at this location they are requesting E Rx

## 2022-01-30 NOTE — Telephone Encounter (Signed)
Peer to peer completed.  Additional information provided regarding timing of her previous chiropractic care for current symptoms.  Authorization #[id number] 4 MRI of cervical spine.  Will forward to referrals to process order for MRI cervical spine.  Let me know if further information needed. ?

## 2022-02-03 ENCOUNTER — Other Ambulatory Visit: Payer: Self-pay | Admitting: Family Medicine

## 2022-02-03 DIAGNOSIS — R200 Anesthesia of skin: Secondary | ICD-10-CM

## 2022-02-03 DIAGNOSIS — M5412 Radiculopathy, cervical region: Secondary | ICD-10-CM

## 2022-02-03 DIAGNOSIS — M542 Cervicalgia: Secondary | ICD-10-CM

## 2022-02-03 NOTE — Progress Notes (Signed)
MRI cervical spine reordered. Authorization number #[id number] ?

## 2022-02-06 ENCOUNTER — Telehealth: Payer: Self-pay

## 2022-02-06 DIAGNOSIS — Z0279 Encounter for issue of other medical certificate: Secondary | ICD-10-CM

## 2022-02-06 NOTE — Telephone Encounter (Signed)
Form completed.

## 2022-02-06 NOTE — Telephone Encounter (Signed)
Left pt a VM stating I have faxed the forms for her and left the original is the filing cabinet up front for pick . It does have a charge sheet as well . I did make a copy for pt chart .

## 2022-02-09 ENCOUNTER — Telehealth: Payer: Self-pay | Admitting: Family Medicine

## 2022-02-09 NOTE — Telephone Encounter (Signed)
Please see last phone note - FMLA paperwork completed and sent last week.

## 2022-02-09 NOTE — Telephone Encounter (Signed)
Placed in your to be signed folder  ?

## 2022-02-09 NOTE — Telephone Encounter (Signed)
I have place FMLA form and charge sheet in the bin up front.

## 2022-02-09 NOTE — Telephone Encounter (Signed)
Disregard last message - this is for additional information for FMLA. Will complete.

## 2022-02-13 ENCOUNTER — Telehealth: Payer: Self-pay

## 2022-02-13 NOTE — Telephone Encounter (Signed)
Pt called in asking for abx for cold sxs congestion running nose, declined stating would need OV reports she will go to Urgent Care over the weekend if necessary

## 2022-02-15 ENCOUNTER — Other Ambulatory Visit: Payer: Managed Care, Other (non HMO)

## 2022-02-22 ENCOUNTER — Other Ambulatory Visit: Payer: Managed Care, Other (non HMO)

## 2022-02-24 ENCOUNTER — Telehealth: Payer: Self-pay

## 2022-02-24 ENCOUNTER — Other Ambulatory Visit: Payer: Self-pay

## 2022-02-24 ENCOUNTER — Telehealth: Payer: Self-pay | Admitting: Family Medicine

## 2022-02-24 DIAGNOSIS — F900 Attention-deficit hyperactivity disorder, predominantly inattentive type: Secondary | ICD-10-CM

## 2022-02-24 NOTE — Telephone Encounter (Signed)
Pt is requesting her Adderall prescription for pick up due to issues with stock and was requesting it be written for 02/28/22 so she can get this filled at a pharmacy over the weekend

## 2022-02-24 NOTE — Telephone Encounter (Signed)
Medication last discussed on May 5.  dextroamphetamine - amphetamine extended release 20 mg capsules 3/day.  Controlled substance database reviewed.  Last prescription written May 12, filled May 14th for #90.  31 days in May, refill should not be needed until the 13th.  Will she still need it printed as that will be on a Tuesday, and [facility] should be open?

## 2022-02-24 NOTE — Telephone Encounter (Signed)
error 

## 2022-02-25 NOTE — Telephone Encounter (Signed)
Pt requested print due to stock issues and unsure where will have the stock available

## 2022-02-26 ENCOUNTER — Telehealth: Payer: Self-pay | Admitting: Family Medicine

## 2022-02-26 DIAGNOSIS — F900 Attention-deficit hyperactivity disorder, predominantly inattentive type: Secondary | ICD-10-CM

## 2022-02-26 NOTE — Telephone Encounter (Signed)
Patient called and stated that she was contacted by her company and was told that her FMLA paper work was incomplete. I asked if they told her what part was incomplete and she stated they did not. She wanted to see if her paper work could be looked over and made sure that it was complete.   She also wanted to know if the rx could be "ok'd" to fill on the tenth so she can [facility] because it's cheaper and they are closed on Sunday. She would like to have a call back

## 2022-02-26 NOTE — Telephone Encounter (Signed)
Called and requested pt have forms re sent as we do not have these in our possesion any longer, these were faxed on 02/09/22 and have since been sent to scan,  will await fax  Pt also again asking for refill on 02/28/22 due to working during the week and [employer]

## 2022-02-27 MED ORDER — AMPHETAMINE-DEXTROAMPHET ER 20 MG PO CP24: 60.0000 mg | ORAL_CAPSULE | Freq: Every day | ORAL | 0 refills | Status: DC

## 2022-02-27 NOTE — Telephone Encounter (Signed)
I know they did send some clarification request on paperwork, but I thought those have been completed.  If there is additional information needed, please have them send that request again.  We will be happy to look at it further.   In regards to the Adderall as it is a controlled substance will need to be filled closer to due date.  I have printed that so she can pick it up at [facility] or other pharmacy if more convenient.

## 2022-02-27 NOTE — Telephone Encounter (Signed)
Called to discuss LM

## 2022-03-02 ENCOUNTER — Other Ambulatory Visit: Payer: Self-pay

## 2022-03-02 DIAGNOSIS — R208 Other disturbances of skin sensation: Secondary | ICD-10-CM

## 2022-03-02 DIAGNOSIS — R200 Anesthesia of skin: Secondary | ICD-10-CM

## 2022-03-02 DIAGNOSIS — M542 Cervicalgia: Secondary | ICD-10-CM

## 2022-03-02 NOTE — Telephone Encounter (Signed)
Pt called back and discussed paper prescription informed her this is ready, also notes FMLA is moot point as she was denied for not having been with the company long enough and therefore does not need assistance with these forms

## 2022-03-02 NOTE — Telephone Encounter (Signed)
Controlled substance database (PDMP) reviewed.  Lyrica last filled May 25th for #60.  Should have some left based on twice per day dosing.  Has she changed her dosing or out early?  Would also like to see plan from neurosurgery/specialist and whether or not they would like her to continue this medication or changes.  Thanks.

## 2022-03-02 NOTE — Telephone Encounter (Signed)
Patient is requesting a refill of the following medications: Requested Prescriptions   Pending Prescriptions Disp Refills   pregabalin (LYRICA) 25 MG capsule 60 capsule 0    Sig: Take 1 capsule (25 mg total) by mouth 2 (two) times daily.    Date of patient request: 03/02/22 Last office visit: 01/23/22 Date of last refill: 01/23/22 Last refill amount: 60

## 2022-03-03 ENCOUNTER — Telehealth: Payer: Self-pay

## 2022-03-03 MED ORDER — PREGABALIN 25 MG PO CAPS: 25.0000 mg | ORAL_CAPSULE | Freq: Two times a day (BID) | ORAL | 0 refills | Status: DC

## 2022-03-03 NOTE — Telephone Encounter (Signed)
Patient states she came in office to pick up the paper prescription for Adderall '20mg'$  yesterday. Patient states she was only able to pick up 20 of the 90 at [facility] on [address]. Patient states she would need to pick up another paper copy for the other 70 on Thursday.

## 2022-03-03 NOTE — Telephone Encounter (Signed)
She does have some left but wanted to request the refill prior to running out.

## 2022-03-03 NOTE — Telephone Encounter (Signed)
Ok. Pended refill for 6/23.

## 2022-03-05 ENCOUNTER — Other Ambulatory Visit: Payer: Self-pay | Admitting: Family Medicine

## 2022-03-05 DIAGNOSIS — F900 Attention-deficit hyperactivity disorder, predominantly inattentive type: Secondary | ICD-10-CM

## 2022-03-05 NOTE — Telephone Encounter (Signed)
Noted.  Controlled substance database reviewed.  #20 of the extended release dextroamphetamine dextroamphetamine filled on 03/03/2022 at [facility]. If taking 3/day, should last just over 6 days (due for additional fill on 6/19).   I called [facility] where Crystal Williamson had her most recent XR formulation filled.  They do have a immediate release there if Crystal Williamson wanted to go that route but they also do get shipments of the XR intermittently.  If they had a shipment of the XR come in between now and Monday, Crystal Williamson can fill the remainder 70 capsules at that pharmacy without a new prescription.  If Crystal Williamson chooses to use a different pharmacy, I can send a prescription for the immediate release elsewhere but would need to wait to fill that until the 19th.  Let me know how Crystal Williamson would like to proceed.

## 2022-03-05 NOTE — Telephone Encounter (Signed)
Due to shortage, pt was given 20 tablets of generic Adderall 20 mg XR but could not be given other 70 tablets, has found that XR is most backordered and has requested 20 mg IR be sent to [facility] on [address] I have pended XR below to correct pharmacy but RX will need to change to IR   Please see me if clarification is needed thank you

## 2022-03-05 NOTE — Telephone Encounter (Signed)
Pt stating she picked up her Amphetamine-dextroamhetamine 20 mg for [facility], but they could only gave her 20 capsule. PT states that [name] on [address] have Amphetamine-dextroamhetamine 20 mg tablets. Pt want to know if she can get  70 tablets of the Amphetamine-dextroamhetamine 20 mg from [facility] on [address].

## 2022-03-06 MED ORDER — AMPHETAMINE-DEXTROAMPHETAMINE 20 MG PO TABS: 20.0000 mg | ORAL_TABLET | Freq: Three times a day (TID) | ORAL | 0 refills | Status: DC

## 2022-03-06 NOTE — Telephone Encounter (Signed)
Sounds good.  I have prescribed the immediate release 20 mg to take 3/day, can pick up on June 18 at [facility].  Because this is a short acting formulation, may need to take 2 pills in the morning, 1 at lunch so that it will last through the day.  She can adjust around the timing of those 3 pills for best relief during the day, but the later she takes them, may notice some insomnia.  Let me know if there are questions.

## 2022-03-06 NOTE — Telephone Encounter (Signed)
Patient called back to see if the '20mg'$  adderall had been called in yet, I let her know that it didn't look as though it had. For some reason this message thread was not under encounters in patient chart and I am just now reading all of these. I called patient back to discuss message from Dr Carlota Raspberry to see what she would like to do for sure.

## 2022-03-06 NOTE — Telephone Encounter (Signed)
Called pt, no answer, LM to explain and asked to call back if this did not make sense

## 2022-03-06 NOTE — Telephone Encounter (Signed)
Pt wanted rx filled 03/08/22 due to working on Monday and difficulty getting to the pharmacy days she works

## 2022-03-10 ENCOUNTER — Other Ambulatory Visit: Payer: Managed Care, Other (non HMO)

## 2022-03-12 ENCOUNTER — Telehealth: Payer: Self-pay

## 2022-03-12 NOTE — Telephone Encounter (Signed)
Patient called stating that she is still having pain. Patient also states that she has started using her Pregablin 3-4 times a day sometimes and it seems to help. I advised her that it was not the best thing to change medication without speaking to her PCP. The reason for the call was to see if you would change her medication directions to be 3-4 times daily at the same dosage.

## 2022-03-12 NOTE — Telephone Encounter (Signed)
I see that the MRI is pending on June 29, but when we had discussed at previous visit she was going to be seeing the neurosurgeon on May 17.  What did they recommend?  If still having symptoms or need for change in medication, would recommend she discuss plan with neurosurgeon.  I did prescribe Lyrica initially until she was able to discuss definitive plan with neurosurgery.  Let me know if she has discussed plan with them, and I can look at temporary change in medicine if needed.

## 2022-03-13 NOTE — Telephone Encounter (Signed)
LM to call back.

## 2022-03-16 ENCOUNTER — Telehealth (HOSPITAL_COMMUNITY): Payer: 59 | Admitting: Psychiatry

## 2022-03-19 ENCOUNTER — Other Ambulatory Visit: Payer: Managed Care, Other (non HMO)

## 2022-04-02 ENCOUNTER — Telehealth: Payer: Self-pay

## 2022-04-02 ENCOUNTER — Telehealth: Payer: Self-pay | Admitting: Family Medicine

## 2022-04-02 DIAGNOSIS — F900 Attention-deficit hyperactivity disorder, predominantly inattentive type: Secondary | ICD-10-CM

## 2022-04-02 MED ORDER — AMPHETAMINE-DEXTROAMPHETAMINE 20 MG PO TABS: 20.0000 mg | ORAL_TABLET | Freq: Three times a day (TID) | ORAL | 0 refills | Status: DC

## 2022-04-02 NOTE — Telephone Encounter (Signed)
PT last appt was 01/09/22 (mychart visit). PT states that she need a refill on her amphetamine-dextroamphetamine 20 mg. PT wants to know if she can get a paper prescription, due to the shortage on this medication. PT states she has to call around to found this medication.

## 2022-04-02 NOTE — Telephone Encounter (Signed)
Message noted.  She was referred to psychiatry April 21.  On review of that referral she was scheduled with Dr. Adele Schilder on 03/16/2022, but on 03/18/2022 message indicated she had found another provider.  Who is her psychiatrist, has she seen them yet, and if not when is her appointment?  Controlled substance database reviewed.  Dextroamphetamine-amphetamine 20 mg tab #70 filled on 03/08/2022, with #20 filled on 03/03/2022.  Temporary refill provided -printed,  but please obtain information as above.  Thanks.

## 2022-04-02 NOTE — Telephone Encounter (Signed)
Pt requesting paper rx due to shortage at other locations unsure where she will be taking this at this time

## 2022-04-02 NOTE — Telephone Encounter (Signed)
Adderall

## 2022-04-03 ENCOUNTER — Other Ambulatory Visit: Payer: Managed Care, Other (non HMO)

## 2022-04-03 NOTE — Telephone Encounter (Signed)
Patient states she has not been able to make it to a psychiatrist because she has not had any money for the co pay due to keep from trying to keep from being evicted from her home. Patient has not scheduled another appointment as of yet.MRI will be done today because there is no co pay. Patient states she has found the tablets so she just wanted to make sure that's what the prescription refill was for.

## 2022-04-07 NOTE — Telephone Encounter (Signed)
Pt picked up printed Rx last week, she was informed to continue follow with psych and pt was agreeable

## 2022-04-07 NOTE — Telephone Encounter (Signed)
Noted.  We will still need to pursue psychiatry follow-up as soon as she is able.  Medications were refilled for now.

## 2022-04-13 ENCOUNTER — Ambulatory Visit: Payer: Self-pay | Admitting: Family Medicine

## 2022-04-13 DIAGNOSIS — R208 Other disturbances of skin sensation: Secondary | ICD-10-CM

## 2022-04-13 DIAGNOSIS — M542 Cervicalgia: Secondary | ICD-10-CM

## 2022-04-13 DIAGNOSIS — R200 Anesthesia of skin: Secondary | ICD-10-CM

## 2022-04-13 MED ORDER — PREGABALIN 25 MG PO CAPS: 25.0000 mg | ORAL_CAPSULE | Freq: Two times a day (BID) | ORAL | 0 refills | Status: DC

## 2022-04-13 NOTE — Progress Notes (Signed)
Controlled substance database reviewed, last filled June 23.  Refill ordered, but please see my note from 6/22.  What is the plan for follow-up with neurosurgery/imaging for her neuropathic symptoms?

## 2022-04-13 NOTE — Progress Notes (Signed)
Patient requesting a refill for pregabalin (LYRICA) 25 MG capsule  Patient confirmed pharmacy - [facility] on file

## 2022-04-13 NOTE — Progress Notes (Signed)
Patient is requesting a refill of the following medications: Requested Prescriptions   Pending Prescriptions Disp Refills   pregabalin (LYRICA) 25 MG capsule 60 capsule 0    Sig: Take 1 capsule (25 mg total) by mouth 2 (two) times daily.    Date of patient request: 04/13/22 Last office visit: 01/23/22 Date of last refill: 03/03/22 Last refill amount: 60

## 2022-04-14 NOTE — Progress Notes (Signed)
Pt reports due to work and cost she had to change MRI and did not see neurosurgeon yet this is going to be rescheduled after MRI, so she does not see them for a while.  She is requesting another refill tomorrow to [facility] if you're willing

## 2022-04-14 NOTE — Progress Notes (Signed)
Called pt to discuss, LM to call back  03/12/22 note: I see that the MRI is pending on June 29, but when we had discussed at previous visit she was going to be seeing the neurosurgeon on May 17.  What did they recommend?  If still having symptoms or need for change in medication, would recommend she discuss plan with neurosurgeon.  I did prescribe Lyrica initially until she was able to discuss definitive plan with neurosurgery.  Let me know if she has discussed plan with them, and I can look at temporary change in medicine if needed.

## 2022-04-16 ENCOUNTER — Other Ambulatory Visit: Payer: Self-pay | Admitting: Family Medicine

## 2022-04-16 DIAGNOSIS — M542 Cervicalgia: Secondary | ICD-10-CM

## 2022-04-16 DIAGNOSIS — R208 Other disturbances of skin sensation: Secondary | ICD-10-CM

## 2022-04-16 DIAGNOSIS — R200 Anesthesia of skin: Secondary | ICD-10-CM

## 2022-04-16 MED ORDER — PREGABALIN 25 MG PO CAPS: 25.0000 mg | ORAL_CAPSULE | Freq: Two times a day (BID) | ORAL | 0 refills | Status: DC

## 2022-04-16 NOTE — Telephone Encounter (Signed)
Last filled 03/13/2022.  Controlled substance database reviewed.  Will reorder for [facility].  Will need to have visit in the next 6 weeks to discuss plan if she is not able to see neurosurgery during that time.  when is rescheduled MRI?

## 2022-04-16 NOTE — Telephone Encounter (Signed)
Rx went to [facility] rather than preferred [facility]

## 2022-04-16 NOTE — Telephone Encounter (Signed)
Encourage patient to contact the pharmacy for refills or they can request refills through [facility]  (Please schedule appointment if patient has not been seen in over a year)    WHAT PHARMACY WOULD THEY LIKE THIS SENT TO: [facility] [address] [phone]  MEDICATION NAME & DOSE: Pregablin 25 mg  NOTES/COMMENTS FROM PATIENT: Pt is completely out and needs as soon as possible      Front office please notify patient: It takes 48-72 hours to process rx refill requests Ask patient to call pharmacy to ensure rx is ready before heading there.

## 2022-04-20 ENCOUNTER — Telehealth: Payer: Self-pay | Admitting: Family Medicine

## 2022-04-20 DIAGNOSIS — F900 Attention-deficit hyperactivity disorder, predominantly inattentive type: Secondary | ICD-10-CM

## 2022-04-20 DIAGNOSIS — M62838 Other muscle spasm: Secondary | ICD-10-CM

## 2022-04-20 DIAGNOSIS — M62830 Muscle spasm of back: Secondary | ICD-10-CM
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
## 2022-04-21 ENCOUNTER — Other Ambulatory Visit: Payer: Self-pay

## 2022-04-21 ENCOUNTER — Telehealth: Payer: Self-pay

## 2022-04-21 DIAGNOSIS — F900 Attention-deficit hyperactivity disorder, predominantly inattentive type: Secondary | ICD-10-CM

## 2022-04-21 DIAGNOSIS — M62830 Muscle spasm of back: Secondary | ICD-10-CM

## 2022-04-21 DIAGNOSIS — M62838 Other muscle spasm: Secondary | ICD-10-CM

## 2022-04-21 MED ORDER — CYCLOBENZAPRINE HCL 5 MG PO TABS: 5.0000 mg | ORAL_TABLET | Freq: Three times a day (TID) | ORAL | 0 refills | Status: DC | PRN

## 2022-04-21 MED ORDER — AMPHETAMINE-DEXTROAMPHETAMINE 20 MG PO TABS: 20.0000 mg | ORAL_TABLET | Freq: Three times a day (TID) | ORAL | 0 refills | Status: DC
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
## 2022-04-21 NOTE — Addendum Note (Signed)
Addended by: Merri Ray R on: 04/21/2022 10:16 AM   Modules accepted: Orders
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
## 2022-04-29 ENCOUNTER — Other Ambulatory Visit: Payer: Self-pay

## 2022-04-29 ENCOUNTER — Other Ambulatory Visit: Payer: Self-pay | Admitting: Family Medicine

## 2022-04-29 DIAGNOSIS — F900 Attention-deficit hyperactivity disorder, predominantly inattentive type: Secondary | ICD-10-CM

## 2022-04-29 MED ORDER — AMPHETAMINE-DEXTROAMPHETAMINE 20 MG PO TABS: 20.0000 mg | ORAL_TABLET | Freq: Three times a day (TID) | ORAL | 0 refills | Status: DC
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
## 2022-04-29 NOTE — Telephone Encounter (Signed)
Encourage patient to contact the pharmacy for refills or they can request refills through [facility]  (Please schedule appointment if patient has not been seen in over a year)    [place] THIS SENT TO: [name] [address] [phone]   MEDICATION NAME & DOSE: adderall 20 mg  NOTES/COMMENTS FROM PATIENT:      [place] office please notify patient: It takes 48-72 hours to process rx refill requests Ask patient to call pharmacy to ensure rx is ready before heading there.
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
## 2022-05-08 ENCOUNTER — Telehealth: Payer: Self-pay | Admitting: Family Medicine

## 2022-05-08 ENCOUNTER — Other Ambulatory Visit: Payer: Managed Care, Other (non HMO)

## 2022-05-08 DIAGNOSIS — R739 Hyperglycemia, unspecified: Secondary | ICD-10-CM

## 2022-05-08 DIAGNOSIS — Z131 Encounter for screening for diabetes mellitus: Secondary | ICD-10-CM

## 2022-05-08 DIAGNOSIS — Z13220 Encounter for screening for lipoid disorders: Secondary | ICD-10-CM

## 2022-05-08 NOTE — Telephone Encounter (Signed)
Caller name: Richetta   On DPR? :yes/no: Yes  Call back number: [phone]  Provider they see: Carlota Raspberry  Reason for call: pt is wanting to get fasting labs ASAP before insurance runs out at the end of next week (08/25). Please advise.

## 2022-05-10 ENCOUNTER — Other Ambulatory Visit: Payer: Managed Care, Other (non HMO)

## 2022-05-11 NOTE — Telephone Encounter (Signed)
Last labs in August 2022, CMP at that time.  Previous hyperglycemia but normal last year.  Fasting lab visit is fine for CMP, lipid panel, A1c, then should schedule physical to review other health maintenance when possible.

## 2022-05-13 ENCOUNTER — Other Ambulatory Visit: Payer: Self-pay | Admitting: Family Medicine

## 2022-05-13 DIAGNOSIS — R200 Anesthesia of skin: Secondary | ICD-10-CM

## 2022-05-13 DIAGNOSIS — R208 Other disturbances of skin sensation: Secondary | ICD-10-CM

## 2022-05-13 DIAGNOSIS — M542 Cervicalgia: Secondary | ICD-10-CM

## 2022-05-13 NOTE — Telephone Encounter (Signed)
Patient is requesting a refill of the following medications: Requested Prescriptions   Pending Prescriptions Disp Refills   pregabalin (LYRICA) 25 MG capsule [Pharmacy Med Name: PREGABALIN 25 MG CAPSULE] 60 capsule     Sig: TAKE 1 CAPSULE BY MOUTH TWICE A DAY    Date of patient request: 05/13/22 Last office visit: 01/23/22 Date of last refill: 04/16/22 Last refill amount: 60 Follow up time period per chart: 6 months

## 2022-05-15 NOTE — Telephone Encounter (Signed)
Previous plan was to follow-up with neurosurgery to discuss treatment.  Based on July 25 encounter, she planned to see a neurosurgeon after rescheduled MRI. There was appointment scheduled on 8/18 - shows cancelled by patient, then no show for radiology appointment 8/20.   One refill of lyrica ordered as no MRI or neurosurgery follow up. Will need to see neurosurgery to discuss further refills or needs office visit to discuss plan.   Controlled substance database reviewed.  Last filled pregabalin 25 mg #60 on 04/15/2022.  Order placed.

## 2022-05-27 ENCOUNTER — Telehealth: Payer: Self-pay | Admitting: Family Medicine

## 2022-05-27 DIAGNOSIS — F900 Attention-deficit hyperactivity disorder, predominantly inattentive type: Secondary | ICD-10-CM

## 2022-05-27 MED ORDER — AMPHETAMINE-DEXTROAMPHETAMINE 20 MG PO TABS: 20.0000 mg | ORAL_TABLET | Freq: Three times a day (TID) | ORAL | 0 refills | Status: DC

## 2022-05-27 NOTE — Telephone Encounter (Signed)
Encourage patient to contact the pharmacy for refills or they can request refills through [facility]  (Please schedule appointment if patient has not been seen in over a year)    WHAT PHARMACY WOULD THEY LIKE THIS SENT TO: [name] [phone]  MEDICATION NAME & DOSE: adderall 20 mg  NOTES/COMMENTS FROM PATIENT:       [place] office please notify patient: It takes 48-72 hours to process rx refill requests Ask patient to call pharmacy to ensure rx is ready before heading there.

## 2022-05-27 NOTE — Telephone Encounter (Signed)
Controlled substance database reviewed.  Office visit discussing medication on May 5th. Dextroamphetamine-amphetamine 20 mg tablet #90 filled on 05/01/2022.  Refill ordered for pickup on September 9.

## 2022-06-09 ENCOUNTER — Other Ambulatory Visit: Payer: Self-pay | Admitting: Family Medicine

## 2022-06-09 DIAGNOSIS — F332 Major depressive disorder, recurrent severe without psychotic features: Secondary | ICD-10-CM

## 2022-06-23 ENCOUNTER — Telehealth: Payer: Self-pay | Admitting: Family Medicine

## 2022-06-23 DIAGNOSIS — F900 Attention-deficit hyperactivity disorder, predominantly inattentive type: Secondary | ICD-10-CM

## 2022-06-23 MED ORDER — AMPHETAMINE-DEXTROAMPHETAMINE 20 MG PO TABS: 20.0000 mg | ORAL_TABLET | Freq: Three times a day (TID) | ORAL | 0 refills | Status: DC

## 2022-06-23 NOTE — Telephone Encounter (Signed)
Patient is requesting a refill of the following medications: Requested Prescriptions   Pending Prescriptions Disp Refills   amphetamine-dextroamphetamine (ADDERALL) 20 MG tablet 90 tablet 0    Sig: Take 1 tablet (20 mg total) by mouth 3 (three) times daily.    Date of patient request: 06/23/22 Last office visit: 01/23/22 Date of last refill: 05/27/22 Last refill amount: 90 Follow up time period per chart: 6 months

## 2022-06-23 NOTE — Telephone Encounter (Signed)
Encourage patient to contact the pharmacy for refills or they can request refills through [facility]  (Please schedule appointment if patient has not been seen in over a year)    [place] THIS SENT TO:  Lodema Pilot [phone]  MEDICATION NAME & DOSE: adderall 20 mg tablet  NOTES/COMMENTS FROM PATIENT:      [place] office please notify patient: It takes 48-72 hours to process rx refill requests Ask patient to call pharmacy to ensure rx is ready before heading there.

## 2022-06-23 NOTE — Telephone Encounter (Signed)
Medication discussed at May 5 visit.  Continued same Adderall XR dosing, with plan for psychiatry follow-up.  Will refill for now until follows up with psychiatry or 6 months have elapsed since last visit.  Would need follow-up visit at that time if I am continuing to prescribe refills.  Controlled substance database reviewed.  Last filled #90 on 05/30/2022.  Refill ordered for pickup on 10/8.

## 2022-06-24 NOTE — Telephone Encounter (Signed)
Left vm for patient letting her know the rx had been sent in and asked she call back with updated information letting us know if she is following up with Psychiatry.

## 2022-06-24 NOTE — Telephone Encounter (Signed)
Pt called back stating that she is currently unemployed and doesn't have any insurance at the moment. She will not be seeing psychiatrist.

## 2022-07-03 ENCOUNTER — Other Ambulatory Visit: Payer: Self-pay | Admitting: Family Medicine

## 2022-07-03 DIAGNOSIS — R208 Other disturbances of skin sensation: Secondary | ICD-10-CM

## 2022-07-03 DIAGNOSIS — M542 Cervicalgia: Secondary | ICD-10-CM

## 2022-07-03 DIAGNOSIS — R200 Anesthesia of skin: Secondary | ICD-10-CM

## 2022-07-03 MED ORDER — PREGABALIN 25 MG PO CAPS: 25.0000 mg | ORAL_CAPSULE | Freq: Two times a day (BID) | ORAL | 0 refills | Status: DC

## 2022-07-03 NOTE — Telephone Encounter (Signed)
Please see refill note dated 05/15/2022.  Previous plan was to follow-up with neurosurgery to discuss treatment.  Appointment was scheduled for August 18 for rescheduled MRI which shows to be canceled then no-show for radiology appointment August 20.  I did refill her Lyrica once as no MRI or neurosurgery follow-up on August 25 refill visit.  Asked that she see neurosurgery to discuss further refills or needs office visit to discuss plan.  I do not see any visit scheduled for me.   I will refill 1 additional time as her last in person appointment was May 5, and I do not see that we scheduled a visit from last note, but no further refills without office visit, or follow-up with neurosurgery.  Will need in person appointment to discuss any dosage adjustments.  Controlled substance database reviewed.  Pregabalin 25 mg #60 last filled on 06/11/2022.  Previously 05/14/2022.  I understand she has been using prescription 3 times daily, will refill today but will need to remain on same BID dosing unless discussed in office.

## 2022-07-03 NOTE — Telephone Encounter (Signed)
Patient called requesting a refill for Lyrica. She also wanted to ask if Dr Carlota Raspberry could increase it to 3 times a day instead of 2. Patient states she lost her job and hasn't been to her chiropractor in quite some time, she understands if he can't change the dosage but thought she should ask.   Her LOV was 01/23/2022 Lyrica was sent in on 05/15/2022 #60 0 refills Med pended in case the change is made

## 2022-07-22 ENCOUNTER — Telehealth: Payer: Self-pay

## 2022-07-22 DIAGNOSIS — F900 Attention-deficit hyperactivity disorder, predominantly inattentive type: Secondary | ICD-10-CM

## 2022-07-22 DIAGNOSIS — F5104 Psychophysiologic insomnia: Secondary | ICD-10-CM

## 2022-07-22 MED ORDER — TRAZODONE HCL 50 MG PO TABS: 25.0000 mg | ORAL_TABLET | Freq: Every evening | ORAL | 1 refills | Status: DC | PRN

## 2022-07-22 MED ORDER — AMPHETAMINE-DEXTROAMPHETAMINE 20 MG PO TABS: 20.0000 mg | ORAL_TABLET | Freq: Three times a day (TID) | ORAL | 0 refills | Status: DC

## 2022-07-22 NOTE — Telephone Encounter (Signed)
Patient is requesting a refill of the following medications: Requested Prescriptions   Pending Prescriptions Disp Refills   traZODone (DESYREL) 50 MG tablet 90 tablet 1    Sig: Take 0.5-1 tablets (25-50 mg total) by mouth at bedtime as needed.    Date of patient request: 07/22/22 Last office visit: 01/23/22 Date of last refill: 12/04/21 Last refill amount: 90   Adderall due next week

## 2022-07-22 NOTE — Telephone Encounter (Signed)
Medication discussed in May.  Previous notes reviewed that she is unable to see psychiatrist at this time.  Controlled substance database reviewed.  Dextroamphetamine-amphetamine 20 mg last filled on 06/28/2022 for #90.  Refill will be ordered, pended for refill on November 7th.  Trazodone refilled.  Needs office visit to discuss further refills-I do not see that she has an appointment scheduled, please schedule appointment prior to medications running out.  Thanks.

## 2022-07-23 NOTE — Telephone Encounter (Signed)
Called pt twice to  schedule her appt. No answer, LM to call back to the office to schedule her appt.

## 2022-07-24 NOTE — Telephone Encounter (Signed)
Called to schedule patient, no answer LM

## 2022-08-05 ENCOUNTER — Ambulatory Visit (INDEPENDENT_AMBULATORY_CARE_PROVIDER_SITE_OTHER): Payer: Self-pay | Admitting: Family Medicine

## 2022-08-05 DIAGNOSIS — R208 Other disturbances of skin sensation: Secondary | ICD-10-CM

## 2022-08-05 DIAGNOSIS — R200 Anesthesia of skin: Secondary | ICD-10-CM

## 2022-08-05 DIAGNOSIS — F900 Attention-deficit hyperactivity disorder, predominantly inattentive type: Secondary | ICD-10-CM

## 2022-08-05 DIAGNOSIS — F332 Major depressive disorder, recurrent severe without psychotic features: Secondary | ICD-10-CM

## 2022-08-05 DIAGNOSIS — M542 Cervicalgia: Secondary | ICD-10-CM

## 2022-08-05 MED ORDER — PREGABALIN 25 MG PO CAPS: 25.0000 mg | ORAL_CAPSULE | Freq: Two times a day (BID) | ORAL | 0 refills | Status: DC

## 2022-08-05 NOTE — Progress Notes (Signed)
Virtual Visit via Telephone Note  I connected with Crystal Williamson on 08/05/22 at 3:06 PM after 10 min initial chart review. Phone will not work with a video visit. by telephone and verified that I am speaking with the correct person using two identifiers. Patient location: at home on porch by self.  My location: office - [place].    I discussed the limitations, risks, security and privacy concerns of performing an evaluation and management service by telephone and the availability of in person appointments. I also discussed with the patient that there may be a patient responsible charge related to this service. The patient expressed understanding and agreed to proceed, consent obtained  Chief complaint: Chief Complaint  Patient presents with   ADHD   History of Present Illness:  Attention deficit disorder Last discussed in office May 5.  Treated with maximum dose Adderall XR, 60 mg total per day. She did request additional Adderall dosage when discussed in April.  Psychiatry referral placed to discuss her medication regimen.  Initially had plan to see in June, but as of July and 13th telephone note she was not able to see psychiatrist due to cost of co-pay.  I have continued to prescribe Adderall at same dosage as last visit as unable to see psychiatry.  Telephone in October 4th, unemployed and did not have insurance at the time, will not be seeing psychiatrist.   Controlled substance database reviewed -dextroamphetamine-amphetamine 20 mg tab #90 last filled on 07/29/2022.  Previously filled 06/28/2022, 05/30/2022, 05/01/2022.  No other prescribers. Still taking 3 per day. Has been very helpful for focus symptoms. Feels like time release pills worked better, but still working and less costly.  No new side effects.  Trouble sleeping with or without the adderall - no change with use of meds.  No palpitations.  No appetite suppression. No weight loss.   Depression with  insomnia Recurrent depression, treated with Cymbalta '30mg'$  qd. Occasionally 2 per day if more neck issues.  See prior notes regarding plan for MRI and neurosurgery follow up. She has been unable to have MRI or meet with neurosurgery as MRI needed prior to visit with neurosurgery, had to cancel appts for MRI and unable to see neuro. Medicaid coverage? Unsure what this covers.  Not currently working - looking for job since August. Will be staying in [place] temporarily staring tomorrow through January.  Taking Lyrica '25mg'$  BID. Lyrica has been helpful for neck pain. Has been life changing for hand pains. Has needed 3rd dose with more physical work. Some flair with recent moving.  Controlled substance database (PDMP) reviewed.last filled #60 on 10/20.  Would like printed rx for duloxetine, lyrica, pregabalin prior to leaving town.   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[place]) 05/20/2012   HEADACHE 08/06/2009   KERATOSIS PILARIS 06/14/2009   HYPERGLYCEMIA 02/22/2009   DERMATOPHYTOSIS OF NAIL 02/14/2009   OBESITY 02/14/2009   SEBORRHEIC KERATOSIS 02/14/2009   LOW BACK PAIN, CHRONIC 02/14/2009   FATIGUE 02/14/2009   Attention deficit hyperactivity disorder (ADHD) 02/14/2009   Past Medical History:  Diagnosis Date   Allergy  Depression    Fibroid    Headache(784.0)    No pertinent past medical history    Varicose veins    bilateral   Past Surgical History:  Procedure Laterality Date   ABDOMINAL HYSTERECTOMY  06/01/2012   Procedure: HYSTERECTOMY ABDOMINAL;  Surgeon: Terrance Mass, MD;   Location: [facility];  Service: Gynecology;  Laterality: N/A;   SEPTOPLASTY     VARICOSE VEIN SURGERY     WISDOM TOOTH EXTRACTION     Allergies  Allergen Reactions   Codeine Itching    REACTION: N' \\T'$ \ V   Hydromorphone Itching   Opium    Percocet [Oxycodone-Acetaminophen] Itching   Tramadol Nausea And Vomiting   Prior to Admission medications   Medication Sig Start Date End Date Taking? Authorizing Provider  amphetamine-dextroamphetamine (ADDERALL) 20 MG tablet Take 1 tablet (20 mg total) by mouth 3 (three) times daily. 07/28/22   Wendie Agreste, MD  cyclobenzaprine (FLEXERIL) 5 MG tablet Take 1-2 tablets (5-10 mg total) by mouth 3 (three) times daily as needed for muscle spasms. 04/21/22   Wendie Agreste, MD  DULoxetine (CYMBALTA) 30 MG capsule TAKE 3 CAPSULES BY MOUTH ONCE A DAY 06/10/22   Wendie Agreste, MD  predniSONE (DELTASONE) 20 MG tablet 3 by mouth for 3 days, then 2 by mouth for 2 days, then 1 by mouth for 2 days, then 1/2 by mouth for 2 days. 01/23/22   Wendie Agreste, MD  pregabalin (LYRICA) 25 MG capsule Take 1 capsule (25 mg total) by mouth 2 (two) times daily. 07/03/22   Wendie Agreste, MD  traZODone (DESYREL) 50 MG tablet Take 0.5-1 tablets (25-50 mg total) by mouth at bedtime as needed. 07/22/22   Wendie Agreste, MD   Social History   Socioeconomic History   Marital status: Widowed    Spouse name: Not on file   Number of children: Not on file   Years of education: Not on file   Highest education level: Not on file  Occupational History   Not on file  Tobacco Use   Smoking status: Every Day    Packs/day: 0.25    Types: Cigarettes   Smokeless tobacco: Never   Tobacco comments:    5 cig a day  Substance and Sexual Activity   Alcohol use: Yes    Alcohol/week: 0.0 standard drinks of alcohol    Comment: 3-4x week    Drug use: No   Sexual activity: Not Currently    Birth control/protection: None, Surgical  Other Topics Concern   Not on file  Social  History Narrative   ** Merged History Encounter **       Social Determinants of Health   Financial Resource Strain: Not on file  Food Insecurity: Not on file  Transportation Needs: Not on file  Physical Activity: Not on file  Stress: Not on file  Social Connections: Not on file  Intimate Partner Violence: Not on file     Observations/Objective: There were no vitals filed for this visit. No distress over phone call, proper responses, euthymic mood.  All questions answered with understanding of plan expressed.  Assessment and Plan: Neck pain - Plan: pregabalin (LYRICA) 25 MG capsule Bilateral hand numbness - Plan: pregabalin (LYRICA) 25 MG capsule Dysesthesia of multiple sites - Plan: pregabalin (LYRICA) 25 MG capsule  -Unfortunately she was not able to follow-up for MRI or with neurosurgery.  Reports stable symptoms with Lyrica twice per day.  May have some increased  need temporarily when she is moving, may need 3/day, but this should be short-term then return to twice per day dosing.  Recheck in person in 3 months, ER/RTC precautions if new or worsening symptoms.  Also if her coverage changes or availability to see neuro or have MRI sooner, can place new referral if needed.  Attention deficit hyperactivity disorder (ADHD), predominantly inattentive type  -Stable with current regimen.  Controlled substance database reviewed as above.  Agreed to refill medications for additional 3 months.  Unable to see psychiatry as above.  If any increased dose need or change in meds needed , will need to follow-up with psychiatry to discuss changes.  In person visit in 3 months, check blood pressure, exam at that time.  RTC precautions given.  MDD (major depressive disorder), recurrent severe, without psychosis ([place])  -Stable with current dose Cymbalta, continue same.  Sleep issues as above, chronic.  Have recommended discussion with psychiatry, but will continue trazodone same dose for now with RTC  precautions.  Follow Up Instructions: Patient Instructions  Glad to hear things are overall doing well.  Continue Lyrica twice per day, okay to take an additional dose with the physical activity required for moving but then should return to 2 times per day dosing.  If persistent need for higher dosing should be seen by medical provider.  I  will need to see you in person in 3 months for exam, assessment of blood pressure and discussion of medications.  I can refill medications to your pharmacy during that time.  Let me know when refills needed.  If any new or worsening symptoms be seen by medical provider as we discussed.  I know we have discussed your sleep issues in the past and would recommend follow-up with psychiatry.  Let me know and I can place that referral.  Continue trazodone for now.  I will have our staff check into your insurance coverage to see if there are other opportunities for referrals.    I discussed the assessment and treatment plan with the patient. The patient was provided an opportunity to ask questions and all were answered. The patient agreed with the plan and demonstrated an understanding of the instructions.   The patient was advised to call back or seek an in-person evaluation if the symptoms worsen or if the condition fails to improve as anticipated.  I provided 24 minutes of non-face-to-face time during this encounter.  Signed,   Merri Ray, MD [facility].  08/05/22

## 2022-08-05 NOTE — Patient Instructions (Addendum)
Glad to hear things are overall doing well.  Continue Lyrica twice per day, okay to take an additional dose with the physical activity required for moving but then should return to 2 times per day dosing.  If persistent need for higher dosing should be seen by medical provider.  I  will need to see you in person in 3 months for exam, assessment of blood pressure and discussion of medications.  I can refill medications to your pharmacy during that time.  Let me know when refills needed.  If any new or worsening symptoms be seen by medical provider as we discussed.  I know we have discussed your sleep issues in the past and would recommend follow-up with psychiatry.  Let me know and I can place that referral.  Continue trazodone for now.  I will have our staff check into your insurance coverage to see if there are other opportunities for referrals.

## 2022-08-09 ENCOUNTER — Encounter: Payer: Self-pay | Admitting: Family Medicine

## 2022-08-10 ENCOUNTER — Telehealth: Payer: Self-pay | Admitting: Family Medicine

## 2022-08-10 NOTE — Telephone Encounter (Signed)
Called pt. Pt is aware that she has medicaid/ family planning ins, that insurance is for pregnancy care. Pt states that she does have [employer]). Pt will call back to give more info on her insurance

## 2022-08-10 NOTE — Telephone Encounter (Signed)
(  FYI) called pt. LM to call back to the office about [employer]. I need a copy of her insurance card front and back. Pt can upload copy on mychart.

## 2022-08-24 ENCOUNTER — Telehealth: Payer: Self-pay | Admitting: Family Medicine

## 2022-08-24 DIAGNOSIS — F900 Attention-deficit hyperactivity disorder, predominantly inattentive type: Secondary | ICD-10-CM

## 2022-08-24 NOTE — Telephone Encounter (Signed)
Crystal Williamson, called today requesting a refill on her generic medication for Adderall. She is currently residing in [place] and has been there for a couple months according to the patient. She reports that at her last appointment she was told by Dr. Carlota Raspberry  himself that he can prescribe medication across state lines, but is unable to conduct virtual visits across state lines. She goes on to say that her medication will be out by the weekend.  She provided me with the address in [place] that she is wanting the medication sent to which is [place], [place] [address]. I advised her that prescribing medication across state lines was not allowed;she continued to ask for Crystal Williamson and then Crystal Williamson which I informed her that Crystal Williamson was currently seeing patients and Crystal Williamson was not in the office today. 08/24/22.

## 2022-08-24 NOTE — Telephone Encounter (Signed)
Patient is requesting a refill of the following medications: Requested Prescriptions   Pending Prescriptions Disp Refills   amphetamine-dextroamphetamine (ADDERALL) 20 MG tablet 90 tablet 0    Sig: Take 1 tablet (20 mg total) by mouth 3 (three) times daily.    Date of patient request: 08/24/22 Last office visit: 08/05/22 Date of last refill: 07/28/22 Last refill amount: 90

## 2022-08-25 MED ORDER — AMPHETAMINE-DEXTROAMPHETAMINE 20 MG PO TABS: 20.0000 mg | ORAL_TABLET | Freq: Three times a day (TID) | ORAL | 0 refills | Status: DC

## 2022-08-25 NOTE — Telephone Encounter (Signed)
ADHD and medication was discussed at her November 15 visit.  We did discuss her temporary plans to be staying in [place] from November 16 through January.  My understanding is that I am able to continue her current medications including prescription across state lines,  just not a virtual visit or new treatment.  This also appears to be allowable per review of [place] coding regulations regarding out-of-state prescriptions.  Controlled substance database reviewed, dextroamphetamine-amphetamine 20 mg tab #90 last filled on 07/29/2022. I spoke with pharmacist at [facility] on [place], and verified we are ok to send Rx for the Adderall. Rx sent to fill on 12/7.

## 2022-08-27 ENCOUNTER — Other Ambulatory Visit: Payer: Self-pay

## 2022-08-27 ENCOUNTER — Other Ambulatory Visit: Payer: Self-pay | Admitting: Family Medicine

## 2022-08-27 DIAGNOSIS — M62830 Muscle spasm of back: Secondary | ICD-10-CM

## 2022-08-27 DIAGNOSIS — M62838 Other muscle spasm: Secondary | ICD-10-CM

## 2022-08-27 DIAGNOSIS — F332 Major depressive disorder, recurrent severe without psychotic features: Secondary | ICD-10-CM

## 2022-08-27 DIAGNOSIS — F5104 Psychophysiologic insomnia: Secondary | ICD-10-CM

## 2022-08-27 MED ORDER — CYCLOBENZAPRINE HCL 5 MG PO TABS: 5.0000 mg | ORAL_TABLET | Freq: Three times a day (TID) | ORAL | 0 refills | Status: AC | PRN

## 2022-08-27 MED ORDER — CYCLOBENZAPRINE HCL 5 MG PO TABS: 5.0000 mg | ORAL_TABLET | Freq: Three times a day (TID) | ORAL | 0 refills | Status: DC | PRN

## 2022-08-27 MED ORDER — DULOXETINE HCL 30 MG PO CPEP: ORAL_CAPSULE | ORAL | 0 refills | Status: DC

## 2022-08-27 MED ORDER — TRAZODONE HCL 50 MG PO TABS: 25.0000 mg | ORAL_TABLET | Freq: Every evening | ORAL | 1 refills | Status: DC | PRN

## 2022-08-27 MED ORDER — PREGABALIN 25 MG PO CAPS: 25.0000 mg | ORAL_CAPSULE | Freq: Two times a day (BID) | ORAL | 0 refills | Status: DC

## 2022-08-27 NOTE — Telephone Encounter (Signed)
The Trazodone , Flexeril and Duloxetine have been sent to [facility] in [place] the pregablain has been sent to DR Birdie Riddle to fill since Dr Carlota Raspberry is out of office

## 2022-08-27 NOTE — Telephone Encounter (Signed)
Encourage patient to contact the pharmacy for refills or they can request refills through [facility]  (Please schedule appointment if patient has not been seen in over a year)    WHAT PHARMACY WOULD THEY LIKE THIS SENT TO:  [facility] [address], [place], [place] [address]  MEDICATION NAME & DOSE: pregabalin 25 mg, flexeril 5 mg and trazodone 50 mg and duloxetine 30 mg.  NOTES/COMMENTS FROM PATIENT:      [place] office please notify patient: It takes 48-72 hours to process rx refill requests Ask patient to call pharmacy to ensure rx is ready before heading there.

## 2022-08-27 NOTE — Telephone Encounter (Signed)
Pregabalin 25 mg LOV: 08/05/22 Last Refill:08/05/22 Upcoming appt: none Pt is a Dr Carlota Raspberry pt

## 2022-08-27 NOTE — Telephone Encounter (Signed)
Left pt a VM stating her refills have been sent to the pharmacy

## 2022-08-31 ENCOUNTER — Telehealth: Payer: Self-pay | Admitting: Family Medicine

## 2022-08-31 DIAGNOSIS — M542 Cervicalgia: Secondary | ICD-10-CM

## 2022-08-31 DIAGNOSIS — R200 Anesthesia of skin: Secondary | ICD-10-CM

## 2022-08-31 DIAGNOSIS — R208 Other disturbances of skin sensation: Secondary | ICD-10-CM

## 2022-08-31 NOTE — Telephone Encounter (Signed)
Caller name: Jeannetta Cerutti  On DPR?: Yes  Call back number: [phone] (mobile)  Provider they see: Wendie Agreste, MD  Reason for call:  pt called stating that Dr.Greene increase her daily dosage medication and pt is out of medication. Pharmacy stating its too early for a refill. Pharmacy needs the okay to refill this medication early. Patient pharmacy is [place], [place].

## 2022-09-01 ENCOUNTER — Telehealth: Payer: Self-pay

## 2022-09-01 NOTE — Telephone Encounter (Signed)
Third dose was required during physical work with moving, short-term additional dose was allowed but if persistent need for 3/day, needs office visit.  Okay to refill early this time.

## 2022-09-01 NOTE — Telephone Encounter (Signed)
Patient needs the instruction change on the pregablin 25 mg.

## 2022-09-01 NOTE — Telephone Encounter (Signed)
Lyrica 25 mg  Dr Birdie Riddle sent this in while you where out of office pt states that at her last visit it was discussed that she could take more than 2 tablets daily if needed . Dr Birdie Riddle sent in for 2 times a day .  Can we clarify please

## 2022-09-01 NOTE — Telephone Encounter (Signed)
Spoke to the pharmacist named Amy at [facility] in [place] she states the provider has to send in a new Rx for the medication along with a note stating pt is moving there and will find a PCP there. I have left the pt a VM to call the office so I can explain this to her

## 2022-09-01 NOTE — Telephone Encounter (Signed)
Left pt a VM to call the office back in regards to what medication needs to be refilled no medication listed

## 2022-09-01 NOTE — Telephone Encounter (Signed)
I did send a message to Dr Carlota Raspberry asking for clarification because pt is stating that at her last visit Dr Carlota Raspberry states she could take more than two tablets daily if needed

## 2022-09-02 MED ORDER — PREGABALIN 25 MG PO CAPS: 25.0000 mg | ORAL_CAPSULE | Freq: Two times a day (BID) | ORAL | 0 refills | Status: DC

## 2022-09-02 NOTE — Telephone Encounter (Signed)
Looks like we are still discussing patients increase in ? States she was told she could take 3 I thought I vaguely remembered this change please advise.

## 2022-09-02 NOTE — Addendum Note (Signed)
Addended by: Merri Ray R on: 09/02/2022 05:55 PM   Modules accepted: Orders

## 2022-09-02 NOTE — Telephone Encounter (Signed)
Please see other phone message regarding this request.  I did discuss with her at her last visit that temporary additional dose would be okay when physical work with recent move.   New prescription sent with information that is okay to refill early this time only.  Please clarify other request on other note from pharmacist.  My understanding is that she was only going to be in [place] through January.  Has this plan changed?

## 2022-09-03 NOTE — Telephone Encounter (Signed)
Left pt a VM to return my call  

## 2022-09-03 NOTE — Telephone Encounter (Signed)
December 7 prescription was ordered but on controlled substance database that has not been filled since November 15, for #60 at that time.  I do not see that December 7 prescription was filled.  Should be able to fill most recent prescription.

## 2022-09-03 NOTE — Telephone Encounter (Signed)
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 Will call pharmacy they still would not release medication to her I will call them

## 2022-09-03 NOTE — Telephone Encounter (Signed)
09/24/21 is soonest fill date due to last Rx not authorizing 3 a day they are not able to overide and noted last fill was 08/27/22 and even at 3 a day she should still have more.   Please advise

## 2022-09-03 NOTE — Telephone Encounter (Signed)
Pharmacist confirmed she did NOT pick up rx 08/27/22 and now they will fill this

## 2022-09-22 ENCOUNTER — Other Ambulatory Visit: Payer: Self-pay

## 2022-09-22 DIAGNOSIS — F900 Attention-deficit hyperactivity disorder, predominantly inattentive type: Secondary | ICD-10-CM

## 2022-09-22 MED ORDER — AMPHETAMINE-DEXTROAMPHETAMINE 20 MG PO TABS: 20.0000 mg | ORAL_TABLET | Freq: Three times a day (TID) | ORAL | 0 refills | Status: DC

## 2022-09-22 NOTE — Telephone Encounter (Signed)
Controlled substance database reviewed.  Dextroamphetamine-amphetamine 20 mg tab #90 on 08/27/22.  Medication and plan discussed 08/05/2022.  Plan to be in [place] through January at that time.  Refill ordered.

## 2022-09-22 NOTE — Telephone Encounter (Signed)
Patient is requesting a refill of the following medications: Requested Prescriptions   Pending Prescriptions Disp Refills   amphetamine-dextroamphetamine (ADDERALL) 20 MG tablet 90 tablet 0    Sig: Take 1 tablet (20 mg total) by mouth 3 (three) times daily.    Date of patient request: 09/22/22 Last office visit: 08/05/22 Date of last refill: 08/27/22 Last refill amount: 90

## 2022-09-25 ENCOUNTER — Other Ambulatory Visit: Payer: Self-pay | Admitting: Family Medicine

## 2022-09-25 DIAGNOSIS — M542 Cervicalgia: Secondary | ICD-10-CM

## 2022-09-25 DIAGNOSIS — R200 Anesthesia of skin: Secondary | ICD-10-CM

## 2022-09-25 DIAGNOSIS — R208 Other disturbances of skin sensation: Secondary | ICD-10-CM

## 2022-09-25 MED ORDER — PREGABALIN 25 MG PO CAPS: 25.0000 mg | ORAL_CAPSULE | Freq: Two times a day (BID) | ORAL | 0 refills | Status: DC

## 2022-09-25 NOTE — Telephone Encounter (Signed)
Patient is requesting a refill of the following medications: Requested Prescriptions   Pending Prescriptions Disp Refills   pregabalin (LYRICA) 25 MG capsule 60 capsule 0    Sig: Take 1 capsule (25 mg total) by mouth 2 (two) times daily.    Date of patient request: 09/25/22 Last office visit: 08/05/22 Date of last refill: 09/02/22 Last refill amount: 60

## 2022-09-25 NOTE — Telephone Encounter (Signed)
Left pt a Vm stating medication sent in along with DR Carlota Raspberry message

## 2022-09-25 NOTE — Telephone Encounter (Signed)
Medication and plan discussed at November 15 visit.  Out-of-state 3 January.  Lyrica 25 mg twice daily.  Temporary third dose with more physical work with moving but planned return to twice daily dosing.  Controlled substance database reviewed, last filled 09/04/2022.  Refill pended for when due.

## 2022-09-25 NOTE — Telephone Encounter (Signed)
Encourage patient to contact the pharmacy for refills or they can request refills through [facility]  (Please schedule appointment if patient has not been seen in over a year)    [place] TO: [place] NAME & DOSE: Pregabalin 25 mg   NOTES/COMMENTS FROM PATIENT: Patient states that she has four days of medication left.       [place] office please notify patient: It takes 48-72 hours to process rx refill requests Ask patient to call pharmacy to ensure rx is ready before heading there.

## 2022-10-12 ENCOUNTER — Telehealth: Payer: Self-pay | Admitting: Family Medicine

## 2022-10-12 DIAGNOSIS — R208 Other disturbances of skin sensation: Secondary | ICD-10-CM

## 2022-10-12 DIAGNOSIS — R200 Anesthesia of skin: Secondary | ICD-10-CM

## 2022-10-12 DIAGNOSIS — F900 Attention-deficit hyperactivity disorder, predominantly inattentive type: Secondary | ICD-10-CM

## 2022-10-12 DIAGNOSIS — M542 Cervicalgia: Secondary | ICD-10-CM

## 2022-10-12 NOTE — Telephone Encounter (Signed)
Encourage patient to contact the pharmacy for refills or they can request refills through [place]:  [place], [place] [facility]  Phone: [phone]  Fax: [place], [place] Phone: [phone]  Fax: [phone]      MEDICATION NAME & DOSE: amphetamine-dextroamphetamine (ADDERALL) 20 MG tablet --- goes to [facility]   Pregabalin (LYRICA) 25 MG capsule --- goes to [facility]     NOTES/COMMENTS FROM PATIENT: Pt is Crystal Williamson and wants these 2 prescriptions filled. She has an appt with a primary in [facility] but not until March 14 it was as soon as she could get. It would cost her $187.00 because she doesn't have insurance.      [place] office please notify patient: It takes 48-72 hours to process rx refill requests Ask patient to call pharmacy to ensure rx is ready before heading there.

## 2022-10-13 NOTE — Telephone Encounter (Signed)
I have explained this to the pt and she will contact us when they closer to being refilled . Look in Dr Vonna Kotyk message he has agreed to temporally refill the medications until she est in March with a new PCP

## 2022-10-13 NOTE — Telephone Encounter (Signed)
Adderall 20 mg LOV: 08/05/22 Last Refill:09/22/22 Upcoming appt: no apt   Lyrica 25 mg   Pt is in [place] has an apt with a PCP in March in [place] . Would like [facility] and the Lyrica go to [facility]

## 2022-10-13 NOTE — Telephone Encounter (Signed)
Controlled substance database reviewed.  Pregabalin 25 mg #60 last filled 10/04/2022.  Previously 09/04/2022.  Should not yet be due for refill.  Dextroamphetamine-amphetamine 20 mg tab #90 for 30-day supply last filled 09/26/2022.  Also too early for refill.  Please have her contact us or her pharmacy contact us closer to time that those medications are due.  Noted planned visit with new PCP in [place] in March.  Refills will need to be through their office at that time.  When discussed in November, initial plan was to be in [place] through January, but I will agree to refill medications temporarily until visit with new PCP in March.

## 2022-10-14 NOTE — Telephone Encounter (Signed)
Please close

## 2022-10-22 ENCOUNTER — Other Ambulatory Visit: Payer: Self-pay | Admitting: Family Medicine

## 2022-10-22 DIAGNOSIS — R200 Anesthesia of skin: Secondary | ICD-10-CM

## 2022-10-22 DIAGNOSIS — F900 Attention-deficit hyperactivity disorder, predominantly inattentive type: Secondary | ICD-10-CM

## 2022-10-22 DIAGNOSIS — M542 Cervicalgia: Secondary | ICD-10-CM

## 2022-10-22 DIAGNOSIS — R208 Other disturbances of skin sensation: Secondary | ICD-10-CM

## 2022-10-22 NOTE — Telephone Encounter (Signed)
Patient is requesting a refill of the following medications: Requested Prescriptions   Pending Prescriptions Disp Refills   pregabalin (LYRICA) 25 MG capsule 60 capsule 0    Sig: Take 1 capsule (25 mg total) by mouth 2 (two) times daily.    Date of patient request: 10/22/22 Last office visit: 08/05/22 Date of last refill: 09/22/2022 Last refill amount: 90

## 2022-10-22 NOTE — Telephone Encounter (Signed)
Prescription Request  10/22/2022  Is this a "Controlled Substance" medicine? Yes  LOV: 08/05/2022  What is the name of the medication or equipment? amphetamine-dextroamphetamine (ADDERALL) 20 MG tablet   Have you contacted your pharmacy to request a refill? Yes   Which pharmacy would you like this sent to?    [email or web address] [address], [place], [place] [address] Patient notified that their request is being sent to the clinical staff for review and that they should receive a response within 2 business days.   Please advise at Mobile [phone] (mobile)

## 2022-10-23 MED ORDER — PREGABALIN 25 MG PO CAPS: 25.0000 mg | ORAL_CAPSULE | Freq: Two times a day (BID) | ORAL | 0 refills | Status: DC

## 2022-10-23 MED ORDER — AMPHETAMINE-DEXTROAMPHETAMINE 20 MG PO TABS: 20.0000 mg | ORAL_TABLET | Freq: Three times a day (TID) | ORAL | 0 refills | Status: DC

## 2022-10-23 NOTE — Telephone Encounter (Signed)
Last discussed in November, at that time was on Lyrica twice per day with short-term 3 times per day during her move but plan to return to twice per day dosing.  Previously had been filled 09/04/2022.  Refill date of 10/04/2022 on prescription, controlled substance database reviewed, #60 filled on 10/04/2022.  This would be an early refill based on that timing.   Called patient -  new appt with primary care provider locally starting March 14th. she does not need med yet, just making sure it is still ok to have me fill med until her 3/14 visit. i have agreed to do so. Lyrica ordered to be filled on 11/04/22. Dextroamphetamine No. 90 last filled on 09/26/2022, will refill to be filled on 10/26/22. patient advised.

## 2022-10-23 NOTE — Telephone Encounter (Signed)
Patient called back to get this refill request sent in [place], [place]

## 2022-10-26 NOTE — Telephone Encounter (Signed)
Please clarify as she had requested that I send Lyrica to the [facility] previously and Adderall to [facility].  Currently I do have the prescription sent to the [facility] on [facility] for the Adderall, just not the Lyrica.

## 2022-10-27 NOTE — Telephone Encounter (Signed)
Called and LM to call back to clarify

## 2022-10-27 NOTE — Telephone Encounter (Signed)
Confirmed all Rx go to [facility] except Adderall only will go to [facility] and she is set till the 14th, no further actions required pt will call if there are issues with pick up

## 2022-11-23 ENCOUNTER — Other Ambulatory Visit: Payer: Self-pay | Admitting: Family Medicine

## 2022-11-23 ENCOUNTER — Telehealth: Payer: Self-pay | Admitting: Family Medicine

## 2022-11-23 DIAGNOSIS — F900 Attention-deficit hyperactivity disorder, predominantly inattentive type: Secondary | ICD-10-CM

## 2022-11-23 MED ORDER — AMPHETAMINE-DEXTROAMPHETAMINE 20 MG PO TABS: 20.0000 mg | ORAL_TABLET | Freq: Three times a day (TID) | ORAL | 0 refills | Status: DC

## 2022-11-23 NOTE — Telephone Encounter (Signed)
Encourage patient to contact the pharmacy for refills or they can request refills through [place] TO: [place], [place] ([place] NAME & DOSE:amphetamine-dextroamphetamine (ADDERALL) 20 MG tablet  NOTES/COMMENTS FROM PATIENT:      [place] office please notify patient: It takes 48-72 hours to process rx refill requests Ask patient to call pharmacy to ensure rx is ready before heading there.

## 2022-11-23 NOTE — Telephone Encounter (Signed)
Patient is requesting a refill of the following medications: Requested Prescriptions   Pending Prescriptions Disp Refills   amphetamine-dextroamphetamine (ADDERALL) 20 MG tablet 90 tablet 0    Sig: Take 1 tablet (20 mg total) by mouth 3 (three) times daily.    Date of patient request: 11/23/22 Last office visit: 08/05/22 Date of last refill: 10/26/22 Last refill amount: 90

## 2022-11-23 NOTE — Telephone Encounter (Signed)
Controlled substance database reviewed.  Dextroamphetamine-amphetamine 20 mg #90, 30-day supply last filled 10/26/2022, plan for establish care with new PCP this month.  Will fill 1 additional time.  Prescription sent.

## 2022-11-24 NOTE — Telephone Encounter (Signed)
To clarify this before I call her, is the last time you will fill this for her or you are willing to fill one more in addition to this months while she looks for a PCP nearer to her ?

## 2022-11-24 NOTE — Telephone Encounter (Signed)
My understanding based on her last call was that she had an appointment with a new primary care provider this month.  This prescription should suffice.  Let me know if there are questions.

## 2022-11-25 NOTE — Telephone Encounter (Signed)
error 

## 2022-12-04 ENCOUNTER — Telehealth: Payer: Self-pay | Admitting: Family Medicine

## 2022-12-04 NOTE — Telephone Encounter (Signed)
Encourage patient to contact the pharmacy for refills or they can request refills through [place]: [place], [place] NAME & DOSE: pregabalin (LYRICA) 25 MG capsule    NOTES/COMMENTS FROM PATIENT:[place]. Wants refill on Lyrica 25mg . May 7th she has appt new provider.      [place] office please notify patient: It takes 48-72 hours to process rx refill requests Ask patient to call pharmacy to ensure rx is ready before heading there.

## 2022-12-14 ENCOUNTER — Ambulatory Visit: Payer: Medicaid Other | Admitting: Family Medicine

## 2022-12-23 ENCOUNTER — Ambulatory Visit (INDEPENDENT_AMBULATORY_CARE_PROVIDER_SITE_OTHER): Payer: Self-pay | Admitting: Family Medicine

## 2022-12-23 ENCOUNTER — Ambulatory Visit: Payer: Self-pay | Admitting: Family Medicine

## 2022-12-23 ENCOUNTER — Encounter: Payer: Self-pay | Admitting: Family Medicine

## 2022-12-23 DIAGNOSIS — F900 Attention-deficit hyperactivity disorder, predominantly inattentive type: Secondary | ICD-10-CM

## 2022-12-23 DIAGNOSIS — R200 Anesthesia of skin: Secondary | ICD-10-CM

## 2022-12-23 DIAGNOSIS — R208 Other disturbances of skin sensation: Secondary | ICD-10-CM

## 2022-12-23 DIAGNOSIS — M542 Cervicalgia: Secondary | ICD-10-CM

## 2022-12-23 DIAGNOSIS — F332 Major depressive disorder, recurrent severe without psychotic features: Secondary | ICD-10-CM

## 2022-12-23 MED ORDER — DULOXETINE HCL 30 MG PO CPEP: ORAL_CAPSULE | ORAL | 0 refills | Status: AC

## 2022-12-23 MED ORDER — AMPHETAMINE-DEXTROAMPHETAMINE 20 MG PO TABS: 20.0000 mg | ORAL_TABLET | Freq: Three times a day (TID) | ORAL | 0 refills | Status: DC

## 2022-12-23 NOTE — Progress Notes (Unsigned)
Subjective:  Patient ID: [name], female    DOB: [birth year]  Age: 57 y.o. MRN: [id number]  CC:  Chief Complaint  Patient presents with   Medication Refill    Pt states that she would like a paper prescription for her adderall prescription to fill since she is out of [place]     HPI Brice Jully Sinkfield presents for   Attention deficit disorder Last visit in November.  Had moved to [place] temporarily, with planned visit with new primary care provider in March. She has been treated with maximum dose of Adderall, 60 mg daily.  She has requested higher dosing in the past, and psychiatry referral was placed to discuss medication regimen.  She was unable to see psychiatrist last year due to cost of co-pay.  I have continued to treat her ADD with 60 mg Adderall as unable to see psychiatry, but advised need for psychiatric follow-up if any need for change in dosage or decreased control with current regimen. Controlled substance database reviewed, pregabalin 25 mg #60 filled on 12/06/2022, dextroamphetamine-amphetamine 20 mg #90 filled 11/24/2022. Alcohol up to 3 drinks per day. Denies DUI or legal issues. Denies increased use. Some days without drinks  Did not see PCP in March due to upfront costs. Has appointment with new PCP May 7th. [facility] -billing there will work better. Still living in [place]. Plans to remain in [facility] for now.   Adderall working ok. No further trazodone needed at night. Feels more like self off meds. Getting to sleep ok.  No heart palpitations, appetite issues.  Wt Readings from Last 3 Encounters:  12/23/22 234 lb 6.4 oz (106.3 kg)  01/23/22 211 lb (95.7 kg)  04/23/21 206 lb 12.8 oz (93.8 kg)    Neck pain, hand dysesthesias See prior notes.  History of cervical degenerative disc disease.  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  Evaluated last year with plan for MRI and neurosurgery follow-up, but MRI was needed  prior to neurosurgery appointment.  She canceled appointments for for MRI and unable to see neurosurgery due to her insurance coverage.  Symptoms were treated with Lyrica 25 mg twice daily, and has been treated with Cymbalta 30 mg daily for depression with occasional 2/day for more neck issues.  I have continued her Lyrica at same dosage of 25 mg twice daily while in [place].  Did require third dose temporarily with more physical work and moving.  Still taking lyrica 2 times per day everyday. Still has numbness in hands at times, no weakness, no progression of symptoms.  Not as bad with less activity.    Depression: With insomnia, chronic insomnia.  Treated with trazodone at bedtime, Cymbalta 90 mg daily for depression. No new side effects with current med.  Not needing trazodone at night. Ran out and doing ok off that med.       12/23/2022    2:01 PM 01/23/2022   12:03 PM 12/04/2021    4:11 PM 04/23/2021    9:58 AM 01/16/2021    7:51 AM  Depression screen PHQ 2/9  Decreased Interest 0 0 0 1 0  Down, Depressed, Hopeless 0 0 0 1 1  PHQ - 2 Score 0 0 0 2 1  Altered sleeping  0 0 1 1  Tired, decreased energy  0 0 3 1  Change in appetite  0 0 0 2  Feeling bad or failure about yourself   0 0 1  1  Trouble concentrating  0 0 1 1  Moving slowly or fidgety/restless  0 0 0 0  Suicidal thoughts  0 0 0 0  PHQ-9 Score  0 0 8 7  Difficult doing work/chores  Not difficult at all         12/23/2022    2:01 PM  GAD 7 : Generalized Anxiety Score  Nervous, Anxious, on Edge 0  Control/stop worrying 0  Worry too much - different things 0  Trouble relaxing 0  Restless 0  Easily annoyed or irritable 0  Afraid - awful might happen 0  Total GAD 7 Score 0  Anxiety Difficulty Not difficult at all       History Patient Active Problem List   Diagnosis Date Noted   Postmenopausal hormone replacement therapy 02/22/2018   MDD (major depressive disorder), recurrent severe, without psychosis 11/09/2017    Persistent depressive disorder with anxious distress, currently severe    Overdose 11/06/2017   Alopecia 07/19/2012   Adrenal gland cyst 05/20/2012   HEADACHE 08/06/2009   KERATOSIS PILARIS 06/14/2009   HYPERGLYCEMIA 02/22/2009   DERMATOPHYTOSIS OF NAIL 02/14/2009   OBESITY 02/14/2009   SEBORRHEIC KERATOSIS 02/14/2009   LOW BACK PAIN, CHRONIC 02/14/2009   FATIGUE 02/14/2009   Attention deficit hyperactivity disorder (ADHD) 02/14/2009   Past Medical History:  Diagnosis Date   Allergy    Depression    Fibroid    Headache(784.0)    No pertinent past medical history    Varicose veins    bilateral   Past Surgical History:  Procedure Laterality Date   ABDOMINAL HYSTERECTOMY  06/01/2012   Procedure: HYSTERECTOMY ABDOMINAL;  Surgeon: Terrance Mass, MD;  Location: [facility];  Service: Gynecology;  Laterality: N/A;   SEPTOPLASTY     VARICOSE VEIN SURGERY     WISDOM TOOTH EXTRACTION     Allergies  Allergen Reactions   Codeine Itching    REACTION: N \\T \ V   Hydromorphone Itching   Opium    Percocet [Oxycodone-Acetaminophen] Itching   Tramadol Nausea And Vomiting   Prior to Admission medications   Medication Sig Start Date End Date Taking? Authorizing Provider  amphetamine-dextroamphetamine (ADDERALL) 20 MG tablet Take 1 tablet (20 mg total) by mouth 3 (three) times daily. 11/23/22   Wendie Agreste, MD  cyclobenzaprine (FLEXERIL) 5 MG tablet Take 1-2 tablets (5-10 mg total) by mouth 3 (three) times daily as needed for muscle spasms. 08/27/22   Wendie Agreste, MD  DULoxetine (CYMBALTA) 30 MG capsule TAKE 3 CAPSULES BY MOUTH ONCE A DAY 08/27/22   Wendie Agreste, MD  predniSONE (DELTASONE) 20 MG tablet 3 by mouth for 3 days, then 2 by mouth for 2 days, then 1 by mouth for 2 days, then 1/2 by mouth for 2 days. 01/23/22   Wendie Agreste, MD  pregabalin (LYRICA) 25 MG capsule Take 1 capsule (25 mg total) by mouth 2 (two) times daily. 11/04/22   Wendie Agreste, MD  traZODone  (DESYREL) 50 MG tablet Take 0.5-1 tablets (25-50 mg total) by mouth at bedtime as needed. 08/27/22   Wendie Agreste, MD   Social History   Socioeconomic History   Marital status: Widowed    Spouse name: Not on file   Number of children: Not on file   Years of education: Not on file   Highest education level: Not on file  Occupational History   Not on file  Tobacco Use   Smoking status: Every Day  Packs/day: .25    Types: Cigarettes   Smokeless tobacco: Never   Tobacco comments:    5 cig a day  Substance and Sexual Activity   Alcohol use: Yes    Alcohol/week: 0.0 standard drinks of alcohol    Comment: 3-4x week    Drug use: No   Sexual activity: Not Currently    Birth control/protection: None, Surgical  Other Topics Concern   Not on file  Social History Narrative   ** Merged History Encounter **       Social Determinants of Health   Financial Resource Strain: Not on file  Food Insecurity: Not on file  Transportation Needs: Not on file  Physical Activity: Not on file  Stress: Not on file  Social Connections: Not on file  Intimate Partner Violence: Not on file    Review of Systems   Objective:   Vitals:   12/23/22 1402  BP: 138/74  Pulse: 100  Temp: 98.4 F (36.9 C)  TempSrc: Temporal  SpO2: 98%  Weight: 234 lb 6.4 oz (106.3 kg)  Height: 5\' 8"  (1.727 m)     Physical Exam Vitals reviewed.  Constitutional:      Appearance: Normal appearance. She is well-developed.  HENT:     Head: Normocephalic and atraumatic.  Eyes:     Conjunctiva/sclera: Conjunctivae normal.     Pupils: Pupils are equal, round, and reactive to light.  Neck:     Vascular: No carotid bruit.  Cardiovascular:     Rate and Rhythm: Normal rate and regular rhythm.     Heart sounds: Normal heart sounds.  Pulmonary:     Effort: Pulmonary effort is normal.     Breath sounds: Normal breath sounds.  Abdominal:     Palpations: Abdomen is soft. There is no pulsatile mass.      Tenderness: There is no abdominal tenderness.  Musculoskeletal:     Right lower leg: No edema.     Left lower leg: No edema.     Comments: Decreased C-spine rotation, extension.  Upper extremity strength was equal including grip strength bilaterally.  Skin:    General: Skin is warm and dry.  Neurological:     Mental Status: She is alert and oriented to person, place, and time.  Psychiatric:        Mood and Affect: Mood normal.        Behavior: Behavior normal.        Thought Content: Thought content normal.       Assessment & Plan:  Haile Audreyana Quale is a 57 y.o. female . Attention deficit hyperactivity disorder (ADHD), predominantly inattentive type - Plan: amphetamine-dextroamphetamine (ADDERALL) 20 MG tablet  MDD (major depressive disorder), recurrent severe, without psychosis - Plan: DULoxetine (CYMBALTA) 30 MG capsule  Neck pain  Bilateral hand numbness  Dysesthesia of multiple sites   Meds ordered this encounter  Medications   amphetamine-dextroamphetamine (ADDERALL) 20 MG tablet    Sig: Take 1 tablet (20 mg total) by mouth 3 (three) times daily.    Dispense:  90 tablet    Refill:  0    Fill 11/24/22   DULoxetine (CYMBALTA) 30 MG capsule    Sig: TAKE 3 CAPSULES BY MOUTH ONCE A DAY    Dispense:  90 capsule    Refill:  0   Patient Instructions  No change in Adderall for now, I did print a prescription today to fill locally or when you return to [place].  [name] not  have them in stock. Let us know when the Lyrica prescription is needed, and I will refill that until your appointment in May with new primary care provider.  At that time they can assume care of your prescription medications.  I did refill the Cymbalta as well today.  Glad to hear that you are doing well off the trazodone, I have removed that from your medication list.  If any worsening arm symptoms or any weakness be seen right away.  Discuss plan for arm symptoms including possible  imaging or specialist evaluation with your new primary care provider but again I am happy to refill Lyrica until that new PCP visit.   As we discussed I do recommend no more than 1 alcoholic drink per day.  If you do have any difficulty cutting back, please let me know.   Take care!    Signed,   Merri Ray, MD [place], [facility] 12/23/22 2:37 PM

## 2022-12-23 NOTE — Patient Instructions (Signed)
No change in Adderall for now, I did print a prescription today to fill locally or when you return to [place].  [name] not have them in stock. Let us know when the Lyrica prescription is needed, and I will refill that until your appointment in May with new primary care provider.  At that time they can assume care of your prescription medications.  I did refill the Cymbalta as well today.  Glad to hear that you are doing well off the trazodone, I have removed that from your medication list.  If any worsening arm symptoms or any weakness be seen right away.  Discuss plan for arm symptoms including possible imaging or specialist evaluation with your new primary care provider but again I am happy to refill Lyrica until that new PCP visit.   As we discussed I do recommend no more than 1 alcoholic drink per day.  If you do have any difficulty cutting back, please let me know.   Take care!

## 2022-12-24 ENCOUNTER — Encounter: Payer: Self-pay | Admitting: Family Medicine

## 2023-01-12 ENCOUNTER — Other Ambulatory Visit: Payer: Self-pay

## 2023-01-12 DIAGNOSIS — R200 Anesthesia of skin: Secondary | ICD-10-CM

## 2023-01-12 DIAGNOSIS — M542 Cervicalgia: Secondary | ICD-10-CM

## 2023-01-12 DIAGNOSIS — R208 Other disturbances of skin sensation: Secondary | ICD-10-CM

## 2023-01-12 MED ORDER — PREGABALIN 25 MG PO CAPS: 25.0000 mg | ORAL_CAPSULE | Freq: Two times a day (BID) | ORAL | 0 refills | Status: DC

## 2023-01-13 ENCOUNTER — Other Ambulatory Visit: Payer: Self-pay

## 2023-01-13 DIAGNOSIS — R208 Other disturbances of skin sensation: Secondary | ICD-10-CM

## 2023-01-13 DIAGNOSIS — M542 Cervicalgia: Secondary | ICD-10-CM

## 2023-01-13 DIAGNOSIS — R200 Anesthesia of skin: Secondary | ICD-10-CM

## 2023-01-13 MED ORDER — PREGABALIN 25 MG PO CAPS: 25.0000 mg | ORAL_CAPSULE | Freq: Two times a day (BID) | ORAL | 0 refills | Status: AC

## 2023-01-13 MED ORDER — PREGABALIN 25 MG PO CAPS: 25.0000 mg | ORAL_CAPSULE | Freq: Two times a day (BID) | ORAL | 0 refills | Status: DC

## 2023-01-13 NOTE — Telephone Encounter (Signed)
Patient is requesting a refill of the following medications: Requested Prescriptions   Pending Prescriptions Disp Refills   pregabalin (LYRICA) 25 MG capsule 60 capsule 0    Sig: Take 1 capsule (25 mg total) by mouth 2 (two) times daily.    Date of patient request: 01/12/2023 Last office visit: 12/23/2022 Date of last refill: 10/22/2022 Last refill amount: 60 Follow up time period per chart: PRN

## 2023-01-13 NOTE — Progress Notes (Signed)
Pended order was not routed correctly, re-sent as phone note

## 2023-01-13 NOTE — Telephone Encounter (Signed)
Medication discussed at April 3 visit.  Lyrica 2 times per day.  Controlled substance database reviewed.  Pregabalin No. 60 last filled on 12/06/2022.  Previously 11/04/2022.  Refill ordered to [facility] [address] disregard initial printed prescription, will send electronically.Marland Kitchen

## 2023-01-18 ENCOUNTER — Other Ambulatory Visit: Payer: Self-pay | Admitting: Family Medicine

## 2023-01-18 DIAGNOSIS — F900 Attention-deficit hyperactivity disorder, predominantly inattentive type: Secondary | ICD-10-CM

## 2023-01-18 MED ORDER — AMPHETAMINE-DEXTROAMPHETAMINE 20 MG PO TABS
20.0000 mg | ORAL_TABLET | Freq: Three times a day (TID) | ORAL | 0 refills | Status: AC
Start: 2023-01-21 — End: ?

## 2023-01-18 NOTE — Telephone Encounter (Signed)
Encourage patient to contact the pharmacy for refills or they can request refills through [facility]   WHAT PHARMACY WOULD THEY LIKE THIS SENT TO:  [facility] at  [address] [place], [place] [address]  MEDICATION NAME & DOSE: amphetamine-dextroamphetamine (ADDERALL) 20 MG tablet  NOTES/COMMENTS FROM PATIENT:      Front office please notify patient: It takes 48-72 hours to process rx refill requests Ask patient to call pharmacy to ensure rx is ready before heading there.

## 2023-01-18 NOTE — Telephone Encounter (Signed)
Medication discussed at December 23, 2022 visit.  Plan to refill medications until May when she will be meeting with new PCP.  Controlled substance database reviewed.  Dextroamphetamine dextroamphetamine No. 90 last filled on 12/23/2022.  Prescription sent to fill on 01/21/2023.

## 2023-01-18 NOTE — Telephone Encounter (Signed)
Adderall 20 mg LOV: 12/23/22 Last Refill:12/23/22 Upcoming appt: none

## 2023-01-19 NOTE — Telephone Encounter (Signed)
Pt has been informed.
